# Patient Record
Sex: Female | Born: 1940 | Race: White | Hispanic: No | Marital: Married | Smoking: Never smoker
Health system: Southern US, Community
[De-identification: ages and names within clinical notes are randomized; demographics above are authoritative.]

## PROBLEM LIST (undated history)

## (undated) DIAGNOSIS — K5900 Constipation, unspecified: Secondary | ICD-10-CM

## (undated) DIAGNOSIS — I2510 Atherosclerotic heart disease of native coronary artery without angina pectoris: Secondary | ICD-10-CM

## (undated) DIAGNOSIS — R609 Edema, unspecified: Secondary | ICD-10-CM

## (undated) DIAGNOSIS — I10 Essential (primary) hypertension: Secondary | ICD-10-CM

## (undated) DIAGNOSIS — I219 Acute myocardial infarction, unspecified: Secondary | ICD-10-CM

## (undated) DIAGNOSIS — C801 Malignant (primary) neoplasm, unspecified: Secondary | ICD-10-CM

## (undated) HISTORY — PX: COLONOSCOPY: SHX174

## (undated) HISTORY — PX: BACK SURGERY: SHX140

## (undated) HISTORY — PX: CORONARY ANGIOPLASTY: SHX604

---

## 2004-07-06 ENCOUNTER — Ambulatory Visit: Payer: Self-pay | Admitting: Internal Medicine

## 2005-08-07 ENCOUNTER — Ambulatory Visit: Payer: Self-pay | Admitting: Internal Medicine

## 2006-04-12 ENCOUNTER — Ambulatory Visit: Payer: Self-pay | Admitting: Gastroenterology

## 2006-08-10 ENCOUNTER — Ambulatory Visit: Payer: Self-pay | Admitting: Internal Medicine

## 2007-08-12 ENCOUNTER — Ambulatory Visit: Payer: Self-pay | Admitting: Internal Medicine

## 2008-08-13 ENCOUNTER — Ambulatory Visit: Payer: Self-pay | Admitting: Internal Medicine

## 2009-08-17 ENCOUNTER — Ambulatory Visit: Payer: Self-pay | Admitting: Internal Medicine

## 2010-08-19 ENCOUNTER — Ambulatory Visit: Payer: Self-pay | Admitting: Internal Medicine

## 2011-09-13 ENCOUNTER — Ambulatory Visit: Payer: Self-pay | Admitting: Internal Medicine

## 2012-09-25 ENCOUNTER — Ambulatory Visit: Payer: Self-pay | Admitting: Internal Medicine

## 2013-02-24 ENCOUNTER — Ambulatory Visit: Payer: Self-pay | Admitting: Specialist

## 2013-03-05 ENCOUNTER — Encounter (HOSPITAL_COMMUNITY): Payer: Self-pay | Admitting: Pharmacy Technician

## 2013-03-05 ENCOUNTER — Other Ambulatory Visit: Payer: Self-pay | Admitting: Neurosurgery

## 2013-03-06 ENCOUNTER — Encounter (HOSPITAL_COMMUNITY): Payer: Self-pay

## 2013-03-06 ENCOUNTER — Encounter (HOSPITAL_COMMUNITY)
Admission: RE | Admit: 2013-03-06 | Discharge: 2013-03-06 | Disposition: A | Discharge status: Home / Self Care | Payer: Medicare Other | Source: Ambulatory Visit | Attending: Neurosurgery | Admitting: Neurosurgery

## 2013-03-06 HISTORY — DX: Constipation, unspecified: K59.00

## 2013-03-06 HISTORY — DX: Malignant (primary) neoplasm, unspecified: C80.1

## 2013-03-06 HISTORY — DX: Essential (primary) hypertension: I10

## 2013-03-06 LAB — CBC WITH DIFFERENTIAL/PLATELET
Basophils Absolute: 0 10*3/uL (ref 0.0–0.1)
HCT: 39.9 % (ref 36.0–46.0)
Hemoglobin: 14.2 g/dL (ref 12.0–15.0)
Lymphocytes Relative: 21 % (ref 12–46)
Lymphs Abs: 1.7 10*3/uL (ref 0.7–4.0)
Monocytes Absolute: 0.7 10*3/uL (ref 0.1–1.0)
Monocytes Relative: 9 % (ref 3–12)
Neutro Abs: 5.5 10*3/uL (ref 1.7–7.7)
Neutrophils Relative %: 68 % (ref 43–77)
WBC: 8.1 10*3/uL (ref 4.0–10.5)

## 2013-03-06 LAB — SURGICAL PCR SCREEN
MRSA, PCR: NEGATIVE
Staphylococcus aureus: NEGATIVE

## 2013-03-06 LAB — BASIC METABOLIC PANEL
BUN: 18 mg/dL (ref 6–23)
CO2: 31 mEq/L (ref 19–32)
Chloride: 97 mEq/L (ref 96–112)
Creatinine, Ser: 0.83 mg/dL (ref 0.50–1.10)
GFR calc Af Amer: 80 mL/min — ABNORMAL LOW (ref >90)
Potassium: 3.4 mEq/L — ABNORMAL LOW (ref 3.5–5.1)

## 2013-03-06 MED ORDER — DEXAMETHASONE SODIUM PHOSPHATE 10 MG/ML IJ SOLN
10.0000 mg | INTRAMUSCULAR | Status: AC
  Administered 2013-03-07: 10 mg via INTRAVENOUS
  Filled 2013-03-06: qty 1

## 2013-03-06 MED ORDER — CEFAZOLIN SODIUM-DEXTROSE 2-3 GM-% IV SOLR
2.0000 g | INTRAVENOUS | Status: AC
  Administered 2013-03-07: 2 g via INTRAVENOUS
  Filled 2013-03-06: qty 50

## 2013-03-06 NOTE — Progress Notes (Signed)
Pt does not see a cardiologist and does not remember having a  EKG, "if I did it was along time ago,"

## 2013-03-06 NOTE — Pre-Procedure Instructions (Signed)
Mandy Gilbert  03/06/2013   Your procedure is scheduled on:  Friday, October 3rd.  Report to [facility], Main Entrance Juluis Rainier "A"at 5:30 AM.  Call this number if you have problems the morning of surgery: [phone]   Remember:   Do not eat food or drink liquids after midnight.   Take these medicines the morning of surgery with A SIP OF WATER: diltiazem (DILACOR XR), propranolol (INDERAL).  Ultram if needed.     Do not wear jewelry, make-up or nail polish.  Do not wear lotions, powders, or perfumes.  Do not shave 48 hours prior to surgery.   Do not bring valuables to the hospital.  [facility] is not responsible for any belongings or valuables.               Contacts, dentures or bridgework may not be worn into surgery.  Leave suitcase in the car. After surgery it may be brought to your room.  For patients admitted to the hospital, discharge time is determined by your  treatment team.                 Special Instructions:Shower with CHG wash (Bactoshield) tonight and again in the am prior to arriving to hospital.   Please read over the following fact sheets that you were given: Pain Booklet, Coughing and Deep Breathing and Surgical Site Infection Prevention

## 2013-03-07 ENCOUNTER — Encounter (HOSPITAL_COMMUNITY): Payer: Self-pay | Admitting: Anesthesiology

## 2013-03-07 ENCOUNTER — Inpatient Hospital Stay (HOSPITAL_COMMUNITY): Payer: Medicare Other

## 2013-03-07 ENCOUNTER — Inpatient Hospital Stay (HOSPITAL_COMMUNITY)
Admission: RE | Admit: 2013-03-07 | Discharge: 2013-03-07 | DRG: 030 | Disposition: A | Discharge status: Home / Self Care | Payer: Medicare Other | Source: Ambulatory Visit | Attending: Neurosurgery | Admitting: Neurosurgery

## 2013-03-07 ENCOUNTER — Encounter (HOSPITAL_COMMUNITY)
Admission: RE | Disposition: A | Discharge status: Home / Self Care | Payer: Self-pay | Source: Ambulatory Visit | Attending: Neurosurgery

## 2013-03-07 ENCOUNTER — Encounter (HOSPITAL_COMMUNITY): Payer: Self-pay | Admitting: *Deleted

## 2013-03-07 ENCOUNTER — Inpatient Hospital Stay (HOSPITAL_COMMUNITY): Payer: Medicare Other | Admitting: Anesthesiology

## 2013-03-07 DIAGNOSIS — Z79899 Other long term (current) drug therapy: Secondary | ICD-10-CM

## 2013-03-07 DIAGNOSIS — M7138 Other bursal cyst, other site: Secondary | ICD-10-CM | POA: Diagnosis present

## 2013-03-07 DIAGNOSIS — M47817 Spondylosis without myelopathy or radiculopathy, lumbosacral region: Secondary | ICD-10-CM | POA: Diagnosis present

## 2013-03-07 DIAGNOSIS — I10 Essential (primary) hypertension: Secondary | ICD-10-CM | POA: Diagnosis present

## 2013-03-07 DIAGNOSIS — Z01812 Encounter for preprocedural laboratory examination: Secondary | ICD-10-CM

## 2013-03-07 DIAGNOSIS — Z91013 Allergy to seafood: Secondary | ICD-10-CM

## 2013-03-07 DIAGNOSIS — G96198 Other disorders of meninges, not elsewhere classified: Principal | ICD-10-CM | POA: Diagnosis present

## 2013-03-07 DIAGNOSIS — Z882 Allergy status to sulfonamides status: Secondary | ICD-10-CM

## 2013-03-07 HISTORY — PX: LUMBAR LAMINECTOMY/DECOMPRESSION MICRODISCECTOMY: SHX5026

## 2013-03-07 SURGERY — LUMBAR LAMINECTOMY/DECOMPRESSION MICRODISCECTOMY 1 LEVEL
Anesthesia: General | Site: Back | Laterality: Right | Wound class: Clean

## 2013-03-07 MED ORDER — CYCLOBENZAPRINE HCL 10 MG PO TABS: 10.0000 mg | ORAL_TABLET | Freq: Three times a day (TID) | ORAL | Status: DC | PRN

## 2013-03-07 MED ORDER — CEFAZOLIN SODIUM 1-5 GM-% IV SOLN
1.0000 g | Freq: Three times a day (TID) | INTRAVENOUS | Status: DC
  Administered 2013-03-07: 1 g via INTRAVENOUS
  Filled 2013-03-07 (×2): qty 50

## 2013-03-07 MED ORDER — 0.9 % SODIUM CHLORIDE (POUR BTL) OPTIME
TOPICAL | Status: DC | PRN
  Administered 2013-03-07: 1000 mL

## 2013-03-07 MED ORDER — LACTATED RINGERS IV SOLN
INTRAVENOUS | Status: DC | PRN
  Administered 2013-03-07 (×2): via INTRAVENOUS

## 2013-03-07 MED ORDER — ROCURONIUM BROMIDE 100 MG/10ML IV SOLN
INTRAVENOUS | Status: DC | PRN
  Administered 2013-03-07: 40 mg via INTRAVENOUS

## 2013-03-07 MED ORDER — ACETAMINOPHEN 325 MG PO TABS: 650.0000 mg | ORAL_TABLET | ORAL | Status: DC | PRN

## 2013-03-07 MED ORDER — ONDANSETRON HCL 4 MG/2ML IJ SOLN
INTRAMUSCULAR | Status: DC | PRN
  Administered 2013-03-07: 4 mg via INTRAVENOUS

## 2013-03-07 MED ORDER — ALUM & MAG HYDROXIDE-SIMETH 200-200-20 MG/5ML PO SUSP: 30.0000 mL | Freq: Four times a day (QID) | ORAL | Status: DC | PRN

## 2013-03-07 MED ORDER — LIDOCAINE HCL (CARDIAC) 20 MG/ML IV SOLN
INTRAVENOUS | Status: DC | PRN
  Administered 2013-03-07: 50 mg via INTRAVENOUS

## 2013-03-07 MED ORDER — MIDAZOLAM HCL 5 MG/5ML IJ SOLN
INTRAMUSCULAR | Status: DC | PRN
  Administered 2013-03-07: 1 mg via INTRAVENOUS

## 2013-03-07 MED ORDER — OXYCODONE-ACETAMINOPHEN 5-325 MG PO TABS
1.0000 | ORAL_TABLET | ORAL | Status: DC | PRN
  Administered 2013-03-07: 1 via ORAL
  Filled 2013-03-07: qty 1

## 2013-03-07 MED ORDER — OXYCODONE HCL 5 MG PO TABS
ORAL_TABLET | ORAL | Status: AC
  Filled 2013-03-07: qty 1

## 2013-03-07 MED ORDER — SODIUM CHLORIDE 0.9 % IJ SOLN: 3.0000 mL | Freq: Two times a day (BID) | INTRAMUSCULAR | Status: DC

## 2013-03-07 MED ORDER — GLYCOPYRROLATE 0.2 MG/ML IJ SOLN
INTRAMUSCULAR | Status: DC | PRN
  Administered 2013-03-07: .8 mg via INTRAVENOUS

## 2013-03-07 MED ORDER — PROPOFOL 10 MG/ML IV BOLUS
INTRAVENOUS | Status: DC | PRN
  Administered 2013-03-07: 100 mg via INTRAVENOUS

## 2013-03-07 MED ORDER — HEMOSTATIC AGENTS (NO CHARGE) OPTIME
TOPICAL | Status: DC | PRN
  Administered 2013-03-07: 1 via TOPICAL

## 2013-03-07 MED ORDER — THROMBIN 5000 UNITS EX SOLR
CUTANEOUS | Status: DC | PRN
  Administered 2013-03-07 (×2): 5000 [IU] via TOPICAL

## 2013-03-07 MED ORDER — DILTIAZEM HCL ER 240 MG PO CP24
240.0000 mg | ORAL_CAPSULE | Freq: Every morning | ORAL | Status: DC
  Filled 2013-03-07: qty 1

## 2013-03-07 MED ORDER — KETOROLAC TROMETHAMINE 30 MG/ML IJ SOLN
INTRAMUSCULAR | Status: DC | PRN
  Administered 2013-03-07: 30 mg via INTRAVENOUS

## 2013-03-07 MED ORDER — HYDROMORPHONE HCL PF 1 MG/ML IJ SOLN: 0.5000 mg | INTRAMUSCULAR | Status: DC | PRN

## 2013-03-07 MED ORDER — BUPIVACAINE HCL (PF) 0.25 % IJ SOLN
INTRAMUSCULAR | Status: DC | PRN
  Administered 2013-03-07: 20 mL

## 2013-03-07 MED ORDER — ONDANSETRON HCL 4 MG/2ML IJ SOLN
4.0000 mg | INTRAMUSCULAR | Status: DC | PRN
  Administered 2013-03-07: 4 mg via INTRAVENOUS
  Filled 2013-03-07: qty 2

## 2013-03-07 MED ORDER — PHENOL 1.4 % MT LIQD: 1.0000 | OROMUCOSAL | Status: DC | PRN

## 2013-03-07 MED ORDER — MENTHOL 3 MG MT LOZG: 1.0000 | LOZENGE | OROMUCOSAL | Status: DC | PRN

## 2013-03-07 MED ORDER — SODIUM CHLORIDE 0.9 % IR SOLN
Status: DC | PRN
  Administered 2013-03-07: 07:00:00

## 2013-03-07 MED ORDER — INDAPAMIDE 1.25 MG PO TABS
1.2500 mg | ORAL_TABLET | Freq: Every morning | ORAL | Status: DC
  Filled 2013-03-07: qty 1

## 2013-03-07 MED ORDER — ARTIFICIAL TEARS OP OINT
TOPICAL_OINTMENT | OPHTHALMIC | Status: DC | PRN
  Administered 2013-03-07: 1 via OPHTHALMIC

## 2013-03-07 MED ORDER — MEPERIDINE HCL 25 MG/ML IJ SOLN: 6.2500 mg | INTRAMUSCULAR | Status: DC | PRN

## 2013-03-07 MED ORDER — ATORVASTATIN CALCIUM 20 MG PO TABS
20.0000 mg | ORAL_TABLET | Freq: Every morning | ORAL | Status: DC
  Filled 2013-03-07: qty 1

## 2013-03-07 MED ORDER — MIDAZOLAM HCL 2 MG/2ML IJ SOLN: 0.5000 mg | Freq: Once | INTRAMUSCULAR | Status: DC | PRN

## 2013-03-07 MED ORDER — SODIUM CHLORIDE 0.9 % IV SOLN: 250.0000 mL | INTRAVENOUS | Status: DC

## 2013-03-07 MED ORDER — SODIUM CHLORIDE 0.9 % IJ SOLN: 3.0000 mL | INTRAMUSCULAR | Status: DC | PRN

## 2013-03-07 MED ORDER — OXYCODONE HCL 5 MG/5ML PO SOLN: 5.0000 mg | Freq: Once | ORAL | Status: AC | PRN

## 2013-03-07 MED ORDER — NEOSTIGMINE METHYLSULFATE 1 MG/ML IJ SOLN
INTRAMUSCULAR | Status: DC | PRN
  Administered 2013-03-07: 4 mg via INTRAVENOUS

## 2013-03-07 MED ORDER — PROPRANOLOL HCL 20 MG PO TABS
20.0000 mg | ORAL_TABLET | Freq: Two times a day (BID) | ORAL | Status: DC
  Filled 2013-03-07 (×2): qty 1

## 2013-03-07 MED ORDER — HYDROMORPHONE HCL PF 1 MG/ML IJ SOLN
INTRAMUSCULAR | Status: AC
  Filled 2013-03-07: qty 1

## 2013-03-07 MED ORDER — TRAMADOL HCL 50 MG PO TABS: 50.0000 mg | ORAL_TABLET | ORAL | Status: DC | PRN

## 2013-03-07 MED ORDER — PROMETHAZINE HCL 25 MG/ML IJ SOLN: 6.2500 mg | INTRAMUSCULAR | Status: DC | PRN

## 2013-03-07 MED ORDER — EPHEDRINE SULFATE 50 MG/ML IJ SOLN
INTRAMUSCULAR | Status: DC | PRN
  Administered 2013-03-07: 10 mg via INTRAVENOUS

## 2013-03-07 MED ORDER — HYDROMORPHONE HCL PF 1 MG/ML IJ SOLN
0.2500 mg | INTRAMUSCULAR | Status: DC | PRN
Start: 2013-03-07 — End: 2013-03-07
  Administered 2013-03-07 (×2): 0.5 mg via INTRAVENOUS

## 2013-03-07 MED ORDER — SENNA 8.6 MG PO TABS
1.0000 | ORAL_TABLET | Freq: Two times a day (BID) | ORAL | Status: DC
  Administered 2013-03-07: 8.6 mg via ORAL
  Filled 2013-03-07: qty 1

## 2013-03-07 MED ORDER — ACETAMINOPHEN 650 MG RE SUPP: 650.0000 mg | RECTAL | Status: DC | PRN

## 2013-03-07 MED ORDER — OXYCODONE HCL 5 MG PO TABS
5.0000 mg | ORAL_TABLET | Freq: Once | ORAL | Status: AC | PRN
  Administered 2013-03-07: 5 mg via ORAL

## 2013-03-07 MED ORDER — HYDROCODONE-ACETAMINOPHEN 5-325 MG PO TABS: 1.0000 | ORAL_TABLET | ORAL | Status: DC | PRN

## 2013-03-07 MED ORDER — FENTANYL CITRATE 0.05 MG/ML IJ SOLN
INTRAMUSCULAR | Status: DC | PRN
  Administered 2013-03-07: 250 ug via INTRAVENOUS

## 2013-03-07 SURGICAL SUPPLY — 49 items
BAG DECANTER FOR FLEXI CONT (MISCELLANEOUS) ×2 IMPLANT
BENZOIN TINCTURE PRP APPL 2/3 (GAUZE/BANDAGES/DRESSINGS) ×2 IMPLANT
BLADE SURG ROTATE 9660 (MISCELLANEOUS) IMPLANT
BRUSH SCRUB EZ PLAIN DRY (MISCELLANEOUS) ×2 IMPLANT
BUR CUTTER 7.0 ROUND (BURR) ×2 IMPLANT
CANISTER SUCTION 2500CC (MISCELLANEOUS) ×2 IMPLANT
CLOTH BEACON ORANGE TIMEOUT ST (SAFETY) ×2 IMPLANT
CONT SPEC 4OZ CLIKSEAL STRL BL (MISCELLANEOUS) ×2 IMPLANT
DECANTER SPIKE VIAL GLASS SM (MISCELLANEOUS) ×2 IMPLANT
DERMABOND ADVANCED (GAUZE/BANDAGES/DRESSINGS) ×1
DERMABOND ADVANCED .7 DNX12 (GAUZE/BANDAGES/DRESSINGS) ×1 IMPLANT
DRAPE LAPAROTOMY 100X72X124 (DRAPES) ×2 IMPLANT
DRAPE MICROSCOPE ZEISS OPMI (DRAPES) ×2 IMPLANT
DRAPE POUCH INSTRU U-SHP 10X18 (DRAPES) ×2 IMPLANT
DRAPE PROXIMA HALF (DRAPES) IMPLANT
DRAPE SURG 17X23 STRL (DRAPES) ×4 IMPLANT
DURAPREP 26ML APPLICATOR (WOUND CARE) ×2 IMPLANT
ELECT REM PT RETURN 9FT ADLT (ELECTROSURGICAL) ×2
ELECTRODE REM PT RTRN 9FT ADLT (ELECTROSURGICAL) ×1 IMPLANT
GAUZE SPONGE 4X4 16PLY XRAY LF (GAUZE/BANDAGES/DRESSINGS) IMPLANT
GLOVE ECLIPSE 7.5 STRL STRAW (GLOVE) ×2 IMPLANT
GLOVE ECLIPSE 8.5 STRL (GLOVE) ×2 IMPLANT
GLOVE EXAM NITRILE LRG STRL (GLOVE) IMPLANT
GLOVE EXAM NITRILE MD LF STRL (GLOVE) IMPLANT
GLOVE EXAM NITRILE XL STR (GLOVE) IMPLANT
GLOVE EXAM NITRILE XS STR PU (GLOVE) IMPLANT
GLOVE INDICATOR 7.0 STRL GRN (GLOVE) ×2 IMPLANT
GLOVE INDICATOR 8.0 STRL GRN (GLOVE) ×2 IMPLANT
GLOVE OPTIFIT SS 6.5 STRL BRWN (GLOVE) ×4 IMPLANT
GOWN BRE IMP SLV AUR LG STRL (GOWN DISPOSABLE) IMPLANT
GOWN BRE IMP SLV AUR XL STRL (GOWN DISPOSABLE) ×2 IMPLANT
GOWN STRL REIN 2XL LVL4 (GOWN DISPOSABLE) IMPLANT
KIT BASIN OR (CUSTOM PROCEDURE TRAY) ×2 IMPLANT
KIT ROOM TURNOVER OR (KITS) ×2 IMPLANT
NEEDLE HYPO 22GX1.5 SAFETY (NEEDLE) ×2 IMPLANT
NEEDLE SPNL 22GX3.5 QUINCKE BK (NEEDLE) ×2 IMPLANT
NS IRRIG 1000ML POUR BTL (IV SOLUTION) ×2 IMPLANT
PACK LAMINECTOMY NEURO (CUSTOM PROCEDURE TRAY) ×2 IMPLANT
PAD ARMBOARD 7.5X6 YLW CONV (MISCELLANEOUS) ×6 IMPLANT
RUBBERBAND STERILE (MISCELLANEOUS) ×4 IMPLANT
SPONGE GAUZE 4X4 12PLY (GAUZE/BANDAGES/DRESSINGS) ×2 IMPLANT
SPONGE SURGIFOAM ABS GEL SZ50 (HEMOSTASIS) ×2 IMPLANT
STRIP CLOSURE SKIN 1/2X4 (GAUZE/BANDAGES/DRESSINGS) ×2 IMPLANT
SUT VIC AB 2-0 CT1 18 (SUTURE) ×2 IMPLANT
SUT VIC AB 3-0 SH 8-18 (SUTURE) ×2 IMPLANT
SYR 20ML ECCENTRIC (SYRINGE) ×2 IMPLANT
TOWEL OR 17X24 6PK STRL BLUE (TOWEL DISPOSABLE) ×2 IMPLANT
TOWEL OR 17X26 10 PK STRL BLUE (TOWEL DISPOSABLE) ×2 IMPLANT
WATER STERILE IRR 1000ML POUR (IV SOLUTION) ×2 IMPLANT

## 2013-03-07 NOTE — Preoperative (Signed)
Beta Blockers   Reason not to administer Beta Blockers:Not Applicable 

## 2013-03-07 NOTE — H&P (Signed)
Mandy Gilbert is an 72 y.o. female.   Chief Complaint: Right leg pain HPI: 72 year old female presents with right lower chamois pain paresthesias and weakness. Workup demonstrates evidence of a large right-sided L4-5 synovial cyst with marked compression of the right L5 nerve root. Patient presents now for laminotomy and resection of the synovial cyst in hopes of improving her symptoms.  Past Medical History  Diagnosis Date  . Hypertension   . Constipation   . Cancer     Skin cancer legs, basal cell.    Past Surgical History  Procedure Laterality Date  . Colonoscopy      History reviewed. No pertinent family history. Social History:  reports that she has never smoked. She does not have any smokeless tobacco history on file. She reports that she does not drink alcohol or use illicit drugs.  Allergies:  Allergies  Allergen Reactions  . Adhesive [Tape] Itching and Other (See Comments)    Turns red.  Can use paper tape  . Shrimp [Shellfish Allergy] Nausea And Vomiting  . Sulfa Antibiotics Rash    Medications Prior to Admission  Medication Sig Dispense Refill  . atorvastatin (LIPITOR) 20 MG tablet Take 20 mg by mouth every morning.      . Cholecalciferol (VITAMIN D PO) Take 1 tablet by mouth every morning.      . diltiazem (DILACOR XR) 240 MG 24 hr capsule Take 240 mg by mouth every morning.      . indapamide (LOZOL) 1.25 MG tablet Take 1.25 mg by mouth every morning.      Marland Kitchen OVER THE COUNTER MEDICATION Take 2 capsules by mouth at bedtime. Stool Softner      . propranolol (INDERAL) 20 MG tablet Take 20 mg by mouth 2 (two) times daily.      . Psyllium (METAMUCIL PO) Take 3 capsules by mouth every morning.      . traMADol (ULTRAM) 50 MG tablet Take 50 mg by mouth every 4 (four) hours as needed for pain.        Results for orders placed during the hospital encounter of 03/06/13 (from the past 48 hour(s))  SURGICAL PCR SCREEN     Status: None   Collection Time    03/06/13   1:25 PM      Result Value Range   MRSA, PCR NEGATIVE  NEGATIVE   Staphylococcus aureus NEGATIVE  NEGATIVE   Comment:            The Xpert SA Assay (FDA     approved for NASAL specimens     in patients over 68 years of age),     is one component of     a comprehensive surveillance     program.  Test performance has     been validated by [employer] for patients greater     than or equal to 86 year old.     It is not intended     to diagnose infection nor to     guide or monitor treatment.  CBC WITH DIFFERENTIAL     Status: None   Collection Time    03/06/13  1:26 PM      Result Value Range   WBC 8.1  4.0 - 10.5 K/uL   RBC 4.44  3.87 - 5.11 MIL/uL   Hemoglobin 14.2  12.0 - 15.0 g/dL   HCT 78.4  69.6 - 29.5 %   MCV 89.9  78.0 - 100.0 fL  MCH 32.0  26.0 - 34.0 pg   MCHC 35.6  30.0 - 36.0 g/dL   RDW 16.1  09.6 - 04.5 %   Platelets 192  150 - 400 K/uL   Neutrophils Relative % 68  43 - 77 %   Neutro Abs 5.5  1.7 - 7.7 K/uL   Lymphocytes Relative 21  12 - 46 %   Lymphs Abs 1.7  0.7 - 4.0 K/uL   Monocytes Relative 9  3 - 12 %   Monocytes Absolute 0.7  0.1 - 1.0 K/uL   Eosinophils Relative 2  0 - 5 %   Eosinophils Absolute 0.1  0.0 - 0.7 K/uL   Basophils Relative 1  0 - 1 %   Basophils Absolute 0.0  0.0 - 0.1 K/uL  BASIC METABOLIC PANEL     Status: Abnormal   Collection Time    03/06/13  1:26 PM      Result Value Range   Sodium 137  135 - 145 mEq/L   Potassium 3.4 (*) 3.5 - 5.1 mEq/L   Chloride 97  96 - 112 mEq/L   CO2 31  19 - 32 mEq/L   Glucose, Bld 101 (*) 70 - 99 mg/dL   BUN 18  6 - 23 mg/dL   Creatinine, Ser 4.09  0.50 - 1.10 mg/dL   Calcium 81.1  8.4 - 91.4 mg/dL   GFR calc non Af Amer 69 (*) >90 mL/min   GFR calc Af Amer 80 (*) >90 mL/min   Comment: (NOTE)     The eGFR has been calculated using the CKD EPI equation.     This calculation has not been validated in all clinical situations.     eGFR's persistently <90 mL/min signify possible Chronic Kidney      Disease.   Dg Chest 2 View  03/06/2013   CLINICAL DATA:  Preop for lumbar laminectomy. Hypertension.  EXAM: CHEST  2 VIEW  COMPARISON:  None.  FINDINGS: The heart size and mediastinal contours are within normal limits. The lungs are hyperexpanded but clear. No pleural effusion or pneumothorax.  The bony thorax is demineralized but intact.  IMPRESSION: No active cardiopulmonary disease.   Electronically Signed   By: Amie Portland   On: 03/06/2013 14:55    Review of Systems  Constitutional: Negative.   HENT: Negative.   Eyes: Negative.   Respiratory: Negative.   Cardiovascular: Negative.   Gastrointestinal: Negative.   Genitourinary: Negative.   Musculoskeletal: Negative.   Skin: Negative.   Neurological: Negative.   Endo/Heme/Allergies: Negative.   Psychiatric/Behavioral: Negative.     Blood pressure 164/78, pulse 69, temperature 97 F (36.1 C), temperature source Oral, resp. rate 18, SpO2 98.00%. Physical Exam  Constitutional: She is oriented to person, place, and time. She appears well-developed and well-nourished. No distress.  HENT:  Head: Normocephalic and atraumatic.  Right Ear: External ear normal.  Left Ear: External ear normal.  Nose: Nose normal.  Mouth/Throat: Oropharynx is clear and moist. No oropharyngeal exudate.  Eyes: Conjunctivae and EOM are normal. Pupils are equal, round, and reactive to light. Right eye exhibits no discharge. Left eye exhibits no discharge. No scleral icterus.  Neck: Normal range of motion. Neck supple. No tracheal deviation present. No thyromegaly present.  Respiratory: Effort normal and breath sounds normal. No respiratory distress. She has no wheezes.  GI: Soft. Bowel sounds are normal. She exhibits no distension. There is no tenderness.  Musculoskeletal: Normal range of motion. She exhibits no edema and  no tenderness.  Neurological: She is alert and oriented to person, place, and time. She has normal reflexes. She displays normal reflexes.  No cranial nerve deficit. She exhibits normal muscle tone. Coordination normal.  Skin: Skin is warm and dry. No rash noted. She is not diaphoretic. No erythema. No pallor.  Psychiatric: She has a normal mood and affect. Her behavior is normal. Judgment and thought content normal.     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 Plan right L4-5 laminotomy and resection of synovial cyst. Risks and benefits have been explained. Patient patient is aware and wishes to proceed. Janei Scheff A 03/07/2013, 7:38 AM

## 2013-03-07 NOTE — Progress Notes (Signed)
UR COMPLETED  

## 2013-03-07 NOTE — Anesthesia Postprocedure Evaluation (Signed)
  Anesthesia Post-op Note  Patient: Mandy Gilbert  Procedure(s) Performed: Procedure(s): LUMBAR LAMINECTOMY/DECOMPRESSION MICRODISCECTOMY LUMBAR FOUR-FIVE (Right)  Patient Location: PACU  Anesthesia Type:General  Level of Consciousness: awake, alert , oriented and patient cooperative  Airway and Oxygen Therapy: Patient Spontanous Breathing and Patient connected to nasal cannula oxygen  Post-op Pain: none  Post-op Assessment: Post-op Vital signs reviewed, Patient's Cardiovascular Status Stable, Respiratory Function Stable, Patent Airway, No signs of Nausea or vomiting and Pain level controlled  Post-op Vital Signs: Reviewed and stable  Complications: No apparent anesthesia complications

## 2013-03-07 NOTE — Anesthesia Preprocedure Evaluation (Addendum)
Anesthesia Evaluation  Patient identified by MRN, date of birth, ID band Patient awake    Reviewed: Allergy & Precautions, H&P , NPO status , Patient's Chart, lab work & pertinent test results  History of Anesthesia Complications Negative for: history of anesthetic complications  Airway Mallampati: I TM Distance: >3 FB Neck ROM: full    Dental  (+) Teeth Intact and Dental Advidsory Given   Pulmonary neg pulmonary ROS,  breath sounds clear to auscultation  Pulmonary exam normal       Cardiovascular hypertension, On Home Beta Blockers and Pt. on medications Rhythm:Regular Rate:Normal     Neuro/Psych Back pain: tramadol    GI/Hepatic negative GI ROS, Neg liver ROS,   Endo/Other  negative endocrine ROS  Renal/GU negative Renal ROS     Musculoskeletal   Abdominal   Peds  Hematology   Anesthesia Other Findings   Reproductive/Obstetrics                          Anesthesia Physical Anesthesia Plan  ASA: II  Anesthesia Plan: General   Post-op Pain Management:    Induction: Intravenous  Airway Management Planned: Oral ETT  Additional Equipment:   Intra-op Plan:   Post-operative Plan: Extubation in OR  Informed Consent: I have reviewed the patients History and Physical, chart, labs and discussed the procedure including the risks, benefits and alternatives for the proposed anesthesia with the patient or authorized representative who has indicated his/her understanding and acceptance.   Dental Advisory Given and Dental advisory given  Plan Discussed with: Anesthesiologist, CRNA and Surgeon  Anesthesia Plan Comments: (Plan routine monitors, GETA)       Anesthesia Quick Evaluation

## 2013-03-07 NOTE — Transfer of Care (Signed)
Immediate Anesthesia Transfer of Care Note  Patient: Mandy Gilbert  Procedure(s) Performed: Procedure(s): LUMBAR LAMINECTOMY/DECOMPRESSION MICRODISCECTOMY LUMBAR FOUR-FIVE (Right)  Patient Location: PACU  Anesthesia Type:General  Level of Consciousness: awake, alert  and oriented  Airway & Oxygen Therapy: Patient Spontanous Breathing and Patient connected to nasal cannula oxygen  Post-op Assessment: Report given to PACU RN, Post -op Vital signs reviewed and stable and Patient moving all extremities X 4  Post vital signs: Reviewed and stable  Complications: No apparent anesthesia complications

## 2013-03-07 NOTE — Discharge Summary (Signed)
Physician Discharge Summary  Patient ID: Mandy Gilbert MRN: [id number] DOB/AGE: 09/10/1940 72 y.o.  Admit date: 03/07/2013 Discharge date: 03/07/2013  Admission Diagnoses:  Discharge Diagnoses:  Principal Problem:   Synovial cyst of lumbar spine   Discharged Condition: good  Hospital Course: Patient admitted to the hospital where she underwent uncomplicated laminotomy and resection of synovial cyst. Postoperatively she is doing very well. She's up and ambulatory and ready for discharge home.  Consults:   Significant Diagnostic Studies:   Treatments:   Discharge Exam: Blood pressure 128/63, pulse 68, temperature 97.4 F (36.3 C), temperature source Oral, resp. rate 18, SpO2 96.00%. Awake and alert. Oriented and appropriate. Cranial nerve function intact. Motor and sensory function of the extremities normal. Wound clean and dry.  Disposition: Final discharge disposition not confirmed     Medication List         atorvastatin 20 MG tablet  Commonly known as:  LIPITOR  Take 20 mg by mouth every morning.     diltiazem 240 MG 24 hr capsule  Commonly known as:  DILACOR XR  Take 240 mg by mouth every morning.     indapamide 1.25 MG tablet  Commonly known as:  LOZOL  Take 1.25 mg by mouth every morning.     METAMUCIL PO  Take 3 capsules by mouth every morning.     OVER THE COUNTER MEDICATION  Take 2 capsules by mouth at bedtime. Stool Softner     propranolol 20 MG tablet  Commonly known as:  INDERAL  Take 20 mg by mouth 2 (two) times daily.     traMADol 50 MG tablet  Commonly known as:  ULTRAM  Take 50 mg by mouth every 4 (four) hours as needed for pain.     VITAMIN D PO  Take 1 tablet by mouth every morning.         Signed: Dirck Butch A 03/07/2013, 5:58 PM

## 2013-03-07 NOTE — Progress Notes (Signed)
Pt doing well. Pt and husband given D/C instructions with Rx's, verbal understanding given. Pt D/C'd home via wheelchair @ 1855 per MD order. Rema Fendt, RN

## 2013-03-07 NOTE — Anesthesia Procedure Notes (Signed)
Procedure Name: Intubation Date/Time: 03/07/2013 7:52 AM Performed by: Carmela Rima Pre-anesthesia Checklist: Patient identified, Timeout performed, Emergency Drugs available, Suction available and Patient being monitored Patient Re-evaluated:Patient Re-evaluated prior to inductionOxygen Delivery Method: Circle system utilized Preoxygenation: Pre-oxygenation with 100% oxygen Intubation Type: IV induction Ventilation: Mask ventilation without difficulty Laryngoscope Size: Mac and 3 Grade View: Grade I Tube type: Oral Tube size: 7.5 mm Number of attempts: 1 Placement Confirmation: ETT inserted through vocal cords under direct vision,  positive ETCO2 and breath sounds checked- equal and bilateral Secured at: 21 cm Tube secured with: Tape Dental Injury: Teeth and Oropharynx as per pre-operative assessment

## 2013-03-07 NOTE — Op Note (Signed)
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  Resection of epidural nonneoplastic mass (adherent synovial cyst)  Microdissection  Surgeon:Savion Washam A.Soliyana Mcchristian, M.D.  Asst. Surgeon: Newell Coral   Anesthesia: General  Indication: 72 year old female with right lower trimming a pain paresthesias and weakness consistent with predominantly her right L5 radicular pattern but with elements of a right L4 radiculopathy as well. Workup demonstrates evidence of a large right-sided L4-5 synovial cyst with marked compression of the thecal sac and right L4 and L5 nerve roots during patient presents now for decompression and resection of the synovial cyst.  Operative note: After induction of anesthesia, patient positioned prone onto Wilson frame and appropriate padded. Lumbar region prepped and draped. Incision made overlying L4-5. Dissection performed on the right side. Retractor placed. X-ray taken. Level confirmed. Laminotomy performed using high-speed drill and Kerrison rongeurs to remove the inferior aspect of lamina of L4 medial aspect the L4-5 facet joint and the superior rim of the L5 lamina. Ligament flavum was elevated and resected in piecemeal fashion using Kerrison rongeurs. Underlying thecal sac was identified. Synovial cyst was found to be densely carried to the thecal sac and right L5 nerve root. Microscope was brought into the field these were microdissection of the spinal canal. Epidural venous plexus was quite limited and cut. The synovial cyst was dissected free from the axilla of the right L4 nerve root and the cyst was resected as it coursed down along the lateral dura and L5 nerve root on the right side. Gross total  resection of the synovial cyst was achieved. There is no evidence of injury to thecal sac or nerve roots. Foraminotomies were completed along the course the exiting L4 and L5 nerve roots. At this point a very thorough decompression achieved. Wound is then irrigated with and bike solution. Gelfoam was placed topically for hemostasis. Microscope and retractor system were removed. Hemostasis of the muscle was achieved with electrocautery. Wounds and close in layers with Vicryl sutures. Steri-Strips and sterile dressing were applied. There were no apparent outpatient. Patient tolerated the procedure well and she returns to the recovery room postop.

## 2013-03-07 NOTE — Brief Op Note (Signed)
03/07/2013  9:04 AM  PATIENT:  Mandy Gilbert  72 y.o. female  PRE-OPERATIVE DIAGNOSIS:  synovial cyst  POST-OPERATIVE DIAGNOSIS:  synovial cyst  PROCEDURE:  Procedure(s): LUMBAR LAMINECTOMY/DECOMPRESSION MICRODISCECTOMY LUMBAR FOUR-FIVE (Right)  SURGEON:  Surgeon(s) and Role:    * Temple Pacini, MD - Primary    * Hewitt Shorts, MD - Assisting  PHYSICIAN ASSISTANT:   ASSISTANTS:    ANESTHESIA:   general  EBL:  Total I/O In: 1600 [I.V.:1600] Out: 100 [Blood:100]  BLOOD ADMINISTERED:none  DRAINS: none   LOCAL MEDICATIONS USED:  MARCAINE     SPECIMEN:  No Specimen  DISPOSITION OF SPECIMEN:  N/A  COUNTS:  YES  TOURNIQUET:  * No tourniquets in log *  DICTATION: .Dragon Dictation  PLAN OF CARE: Admit for overnight observation  PATIENT DISPOSITION:  PACU - hemodynamically stable.   Delay start of Pharmacological VTE agent (>24hrs) due to surgical blood loss or risk of bleeding: yes

## 2013-03-11 ENCOUNTER — Encounter (HOSPITAL_COMMUNITY): Payer: Self-pay | Admitting: Neurosurgery

## 2013-09-26 ENCOUNTER — Ambulatory Visit: Payer: Self-pay | Admitting: Internal Medicine

## 2014-01-31 DIAGNOSIS — G43909 Migraine, unspecified, not intractable, without status migrainosus: Secondary | ICD-10-CM | POA: Insufficient documentation

## 2014-01-31 DIAGNOSIS — M1990 Unspecified osteoarthritis, unspecified site: Secondary | ICD-10-CM | POA: Insufficient documentation

## 2014-01-31 DIAGNOSIS — I739 Peripheral vascular disease, unspecified: Secondary | ICD-10-CM

## 2014-01-31 DIAGNOSIS — E785 Hyperlipidemia, unspecified: Secondary | ICD-10-CM | POA: Insufficient documentation

## 2014-01-31 DIAGNOSIS — M810 Age-related osteoporosis without current pathological fracture: Secondary | ICD-10-CM | POA: Insufficient documentation

## 2014-01-31 DIAGNOSIS — I10 Essential (primary) hypertension: Secondary | ICD-10-CM | POA: Insufficient documentation

## 2014-01-31 DIAGNOSIS — I779 Disorder of arteries and arterioles, unspecified: Secondary | ICD-10-CM | POA: Insufficient documentation

## 2014-09-28 ENCOUNTER — Ambulatory Visit
Admit: 2014-09-28 | Disposition: A | Discharge status: Home / Self Care | Payer: Self-pay | Attending: Internal Medicine | Admitting: Internal Medicine

## 2014-10-27 IMAGING — CR DG LUMBAR SPINE 1V
1 series · 1 of 1 positions shown · non-contrast
Comparison: None.

CLINICAL DATA: Lumbar laminectomy

EXAM:
LUMBAR SPINE - 1 VIEW

[lateral]
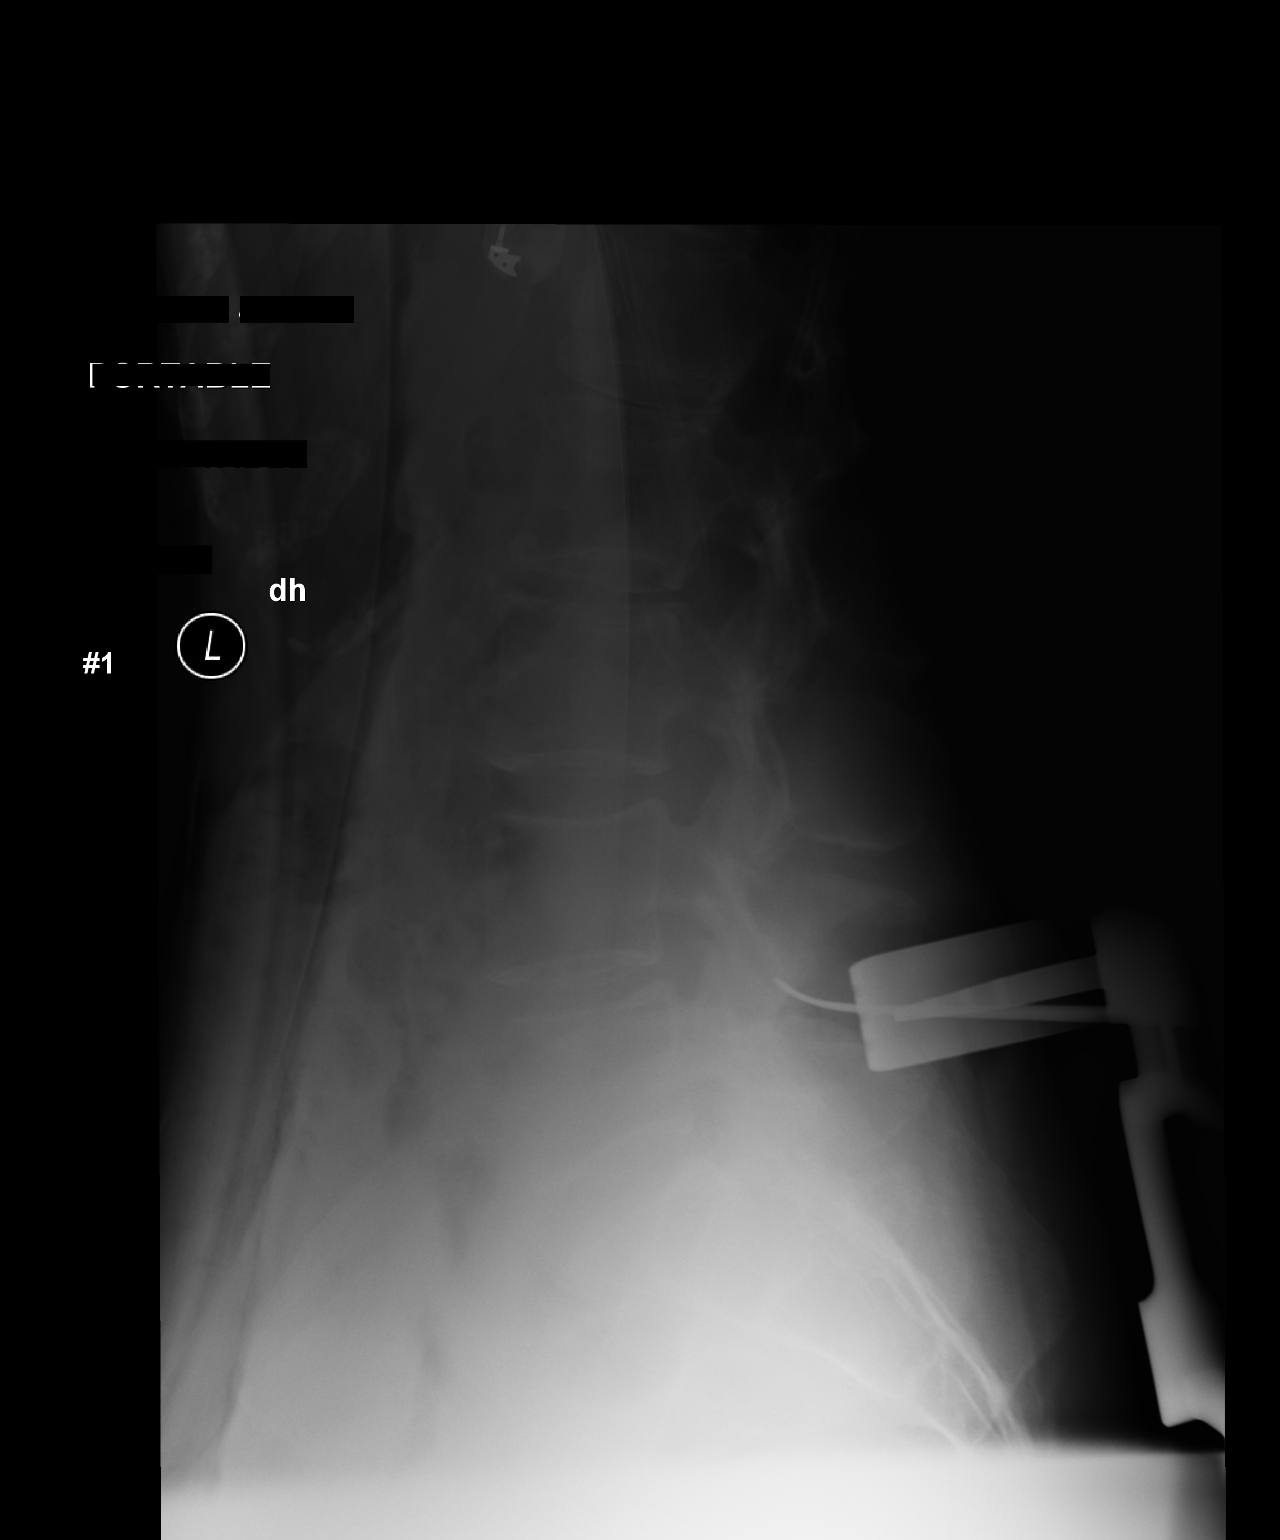

[1 of 1 positions shown; findings below may reference images not displayed]

FINDINGS: The lowest complete disk is presumed as the L5-S1 disk. A surgical
instrument projects over the L4 inferior articular facet. Anatomic
alignment.
IMPRESSION: Intraoperative marking at L4-5.

## 2015-06-11 ENCOUNTER — Encounter (HOSPITAL_COMMUNITY)
Admission: RE | Disposition: A | Discharge status: Home / Self Care | Payer: Self-pay | Source: Other Acute Inpatient Hospital | Attending: Cardiology

## 2015-06-11 ENCOUNTER — Other Ambulatory Visit: Payer: Self-pay | Admitting: Internal Medicine

## 2015-06-11 ENCOUNTER — Encounter (HOSPITAL_COMMUNITY): Payer: Self-pay | Admitting: *Deleted

## 2015-06-11 ENCOUNTER — Other Ambulatory Visit: Payer: Self-pay

## 2015-06-11 ENCOUNTER — Other Ambulatory Visit
Admission: RE | Admit: 2015-06-11 | Discharge: 2015-06-11 | Disposition: A | Discharge status: Home / Self Care | Payer: Medicare Other | Source: Ambulatory Visit | Attending: Internal Medicine | Admitting: Internal Medicine

## 2015-06-11 ENCOUNTER — Ambulatory Visit
Admission: RE | Admit: 2015-06-11 | Discharge: 2015-06-11 | Disposition: A | Discharge status: Home / Self Care | Payer: Medicare Other | Source: Ambulatory Visit | Attending: Internal Medicine | Admitting: Internal Medicine

## 2015-06-11 ENCOUNTER — Inpatient Hospital Stay (HOSPITAL_COMMUNITY)
Admission: RE | Admit: 2015-06-11 | Discharge: 2015-06-13 | DRG: 247 | Disposition: A | Discharge status: Home / Self Care | Payer: Medicare Other | Source: Other Acute Inpatient Hospital | Attending: Cardiology | Admitting: Cardiology

## 2015-06-11 DIAGNOSIS — Z91048 Other nonmedicinal substance allergy status: Secondary | ICD-10-CM | POA: Diagnosis not present

## 2015-06-11 DIAGNOSIS — R7303 Prediabetes: Secondary | ICD-10-CM | POA: Diagnosis present

## 2015-06-11 DIAGNOSIS — Z85828 Personal history of other malignant neoplasm of skin: Secondary | ICD-10-CM | POA: Diagnosis not present

## 2015-06-11 DIAGNOSIS — Z79899 Other long term (current) drug therapy: Secondary | ICD-10-CM

## 2015-06-11 DIAGNOSIS — I2119 ST elevation (STEMI) myocardial infarction involving other coronary artery of inferior wall: Principal | ICD-10-CM | POA: Diagnosis present

## 2015-06-11 DIAGNOSIS — I10 Essential (primary) hypertension: Secondary | ICD-10-CM | POA: Diagnosis present

## 2015-06-11 DIAGNOSIS — Z881 Allergy status to other antibiotic agents status: Secondary | ICD-10-CM

## 2015-06-11 DIAGNOSIS — R0602 Shortness of breath: Secondary | ICD-10-CM

## 2015-06-11 DIAGNOSIS — R0789 Other chest pain: Secondary | ICD-10-CM

## 2015-06-11 DIAGNOSIS — I2510 Atherosclerotic heart disease of native coronary artery without angina pectoris: Secondary | ICD-10-CM | POA: Diagnosis present

## 2015-06-11 DIAGNOSIS — Z91013 Allergy to seafood: Secondary | ICD-10-CM

## 2015-06-11 DIAGNOSIS — E785 Hyperlipidemia, unspecified: Secondary | ICD-10-CM | POA: Diagnosis present

## 2015-06-11 HISTORY — PX: CARDIAC CATHETERIZATION: SHX172

## 2015-06-11 LAB — CK TOTAL AND CKMB (NOT AT ARMC)
CK TOTAL: 73 U/L (ref 38–234)
CK, MB: 2.4 ng/mL (ref 0.5–5.0)
Relative Index: INVALID (ref 0.0–2.5)

## 2015-06-11 LAB — CBC WITH DIFFERENTIAL/PLATELET
BASOS ABS: 0 10*3/uL (ref 0.0–0.1)
BASOS PCT: 0 %
Eosinophils Absolute: 0 10*3/uL (ref 0.0–0.7)
Eosinophils Relative: 0 %
HEMATOCRIT: 38 % (ref 36.0–46.0)
HEMOGLOBIN: 12.9 g/dL (ref 12.0–15.0)
LYMPHS PCT: 9 %
Lymphs Abs: 0.6 10*3/uL — ABNORMAL LOW (ref 0.7–4.0)
MCH: 30.3 pg (ref 26.0–34.0)
MCHC: 33.9 g/dL (ref 30.0–36.0)
MCV: 89.2 fL (ref 78.0–100.0)
Monocytes Absolute: 0.1 10*3/uL (ref 0.1–1.0)
Monocytes Relative: 1 %
NEUTROS ABS: 6.4 10*3/uL (ref 1.7–7.7)
NEUTROS PCT: 90 %
Platelets: 167 10*3/uL (ref 150–400)
RBC: 4.26 MIL/uL (ref 3.87–5.11)
RDW: 13.7 % (ref 11.5–15.5)
WBC: 7.1 10*3/uL (ref 4.0–10.5)

## 2015-06-11 LAB — COMPREHENSIVE METABOLIC PANEL
ALBUMIN: 3.1 g/dL — AB (ref 3.5–5.0)
ALT: 15 U/L (ref 14–54)
ALT: 17 U/L (ref 14–54)
ANION GAP: 10 (ref 5–15)
ANION GAP: 13 (ref 5–15)
AST: 24 U/L (ref 15–41)
AST: 23 U/L (ref 15–41)
Albumin: 3.4 g/dL — ABNORMAL LOW (ref 3.5–5.0)
Alkaline Phosphatase: 63 U/L (ref 38–126)
Alkaline Phosphatase: 68 U/L (ref 38–126)
BILIRUBIN TOTAL: 0.6 mg/dL (ref 0.3–1.2)
BUN: 16 mg/dL (ref 6–20)
BUN: 13 mg/dL (ref 6–20)
CHLORIDE: 102 mmol/L (ref 101–111)
CO2: 27 mmol/L (ref 22–32)
CO2: 27 mmol/L (ref 22–32)
Calcium: 9.3 mg/dL (ref 8.9–10.3)
Calcium: 9.7 mg/dL (ref 8.9–10.3)
Chloride: 104 mmol/L (ref 101–111)
Creatinine, Ser: 0.84 mg/dL (ref 0.44–1.00)
Creatinine, Ser: 0.69 mg/dL (ref 0.44–1.00)
GFR calc Af Amer: >60 mL/min (ref >60)
GFR calc Af Amer: >60 mL/min (ref >60)
GFR calc non Af Amer: >60 mL/min (ref >60)
GFR calc non Af Amer: >60 mL/min (ref >60)
GLUCOSE: 134 mg/dL — AB (ref 65–99)
Glucose, Bld: 177 mg/dL — ABNORMAL HIGH (ref 65–99)
POTASSIUM: 2.8 mmol/L — AB (ref 3.5–5.1)
POTASSIUM: 2.8 mmol/L — AB (ref 3.5–5.1)
SODIUM: 141 mmol/L (ref 135–145)
Sodium: 142 mmol/L (ref 135–145)
TOTAL PROTEIN: 5.5 g/dL — AB (ref 6.5–8.1)
TOTAL PROTEIN: 6.3 g/dL — AB (ref 6.5–8.1)
Total Bilirubin: 0.5 mg/dL (ref 0.3–1.2)

## 2015-06-11 LAB — TROPONIN I
TROPONIN I: 1.28 ng/mL — AB (ref <0.031)
TROPONIN I: 1.08 ng/mL — AB (ref <0.031)
TROPONIN I: 1.5 ng/mL — AB (ref <0.031)

## 2015-06-11 LAB — MRSA PCR SCREENING: MRSA by PCR: NEGATIVE

## 2015-06-11 LAB — CBC
HCT: 36.7 % (ref 36.0–46.0)
HEMOGLOBIN: 12.5 g/dL (ref 12.0–15.0)
MCH: 30.5 pg (ref 26.0–34.0)
MCHC: 34.1 g/dL (ref 30.0–36.0)
MCV: 89.5 fL (ref 78.0–100.0)
Platelets: 162 10*3/uL (ref 150–400)
RBC: 4.1 MIL/uL (ref 3.87–5.11)
RDW: 13.4 % (ref 11.5–15.5)
WBC: 7.8 10*3/uL (ref 4.0–10.5)

## 2015-06-11 LAB — LIPID PANEL
CHOL/HDL RATIO: 2.5 ratio
Cholesterol: 156 mg/dL (ref 0–200)
HDL: 63 mg/dL (ref >40)
LDL Cholesterol: 82 mg/dL (ref 0–99)
Triglycerides: 56 mg/dL (ref <150)
VLDL: 11 mg/dL (ref 0–40)

## 2015-06-11 LAB — PROTIME-INR
INR: 2 — AB (ref 0.00–1.49)
Prothrombin Time: 22.6 seconds — ABNORMAL HIGH (ref 11.6–15.2)

## 2015-06-11 LAB — APTT

## 2015-06-11 LAB — POCT I-STAT CREATININE: Creatinine, Ser: 0.9 mg/dL (ref 0.44–1.00)

## 2015-06-11 LAB — POCT ACTIVATED CLOTTING TIME: Activated Clotting Time: 549 seconds

## 2015-06-11 SURGERY — LEFT HEART CATH AND CORONARY ANGIOGRAPHY
Anesthesia: LOCAL

## 2015-06-11 MED ORDER — HEPARIN SODIUM (PORCINE) 1000 UNIT/ML IJ SOLN
INTRAMUSCULAR | Status: DC | PRN
  Administered 2015-06-11: 4000 [IU] via INTRAVENOUS

## 2015-06-11 MED ORDER — POTASSIUM CHLORIDE CRYS ER 20 MEQ PO TBCR
40.0000 meq | EXTENDED_RELEASE_TABLET | Freq: Once | ORAL | Status: AC
  Administered 2015-06-11: 40 meq via ORAL
  Filled 2015-06-11: qty 2

## 2015-06-11 MED ORDER — FAMOTIDINE IN NACL 20-0.9 MG/50ML-% IV SOLN
INTRAVENOUS | Status: AC
  Filled 2015-06-11: qty 50

## 2015-06-11 MED ORDER — MIDAZOLAM HCL 2 MG/2ML IJ SOLN
INTRAMUSCULAR | Status: AC
  Filled 2015-06-11: qty 2

## 2015-06-11 MED ORDER — IOHEXOL 350 MG/ML SOLN
75.0000 mL | Freq: Once | INTRAVENOUS | Status: AC | PRN
  Administered 2015-06-11: 75 mL via INTRAVENOUS

## 2015-06-11 MED ORDER — SODIUM CHLORIDE 0.9 % IV SOLN
250.0000 mg | INTRAVENOUS | Status: DC | PRN
  Administered 2015-06-11: 1.75 mg/kg/h via INTRAVENOUS

## 2015-06-11 MED ORDER — SODIUM CHLORIDE 0.9 % IV SOLN
INTRAVENOUS | Status: DC | PRN
  Administered 2015-06-11: 68 mL/h via INTRAVENOUS

## 2015-06-11 MED ORDER — NITROGLYCERIN IN D5W 200-5 MCG/ML-% IV SOLN
5.0000 ug/min | INTRAVENOUS | Status: DC
  Administered 2015-06-11: 5 ug/min via INTRAVENOUS

## 2015-06-11 MED ORDER — POTASSIUM CHLORIDE 10 MEQ/100ML IV SOLN
10.0000 meq | INTRAVENOUS | Status: DC
  Filled 2015-06-11 (×2): qty 100

## 2015-06-11 MED ORDER — MIDAZOLAM HCL 2 MG/2ML IJ SOLN
INTRAMUSCULAR | Status: DC | PRN
  Administered 2015-06-11: 1 mg via INTRAVENOUS

## 2015-06-11 MED ORDER — SODIUM CHLORIDE 0.9 % IV SOLN
INTRAVENOUS | Status: AC
  Administered 2015-06-11 – 2015-06-12 (×2): via INTRAVENOUS

## 2015-06-11 MED ORDER — TICAGRELOR 90 MG PO TABS: 90.0000 mg | ORAL_TABLET | Freq: Two times a day (BID) | ORAL | Status: DC

## 2015-06-11 MED ORDER — IOHEXOL 350 MG/ML SOLN
INTRAVENOUS | Status: DC | PRN
  Administered 2015-06-11: 125 mL via INTRA_ARTERIAL

## 2015-06-11 MED ORDER — ATROPINE SULFATE 0.1 MG/ML IJ SOLN
INTRAMUSCULAR | Status: AC
  Filled 2015-06-11: qty 10

## 2015-06-11 MED ORDER — TICAGRELOR 90 MG PO TABS
ORAL_TABLET | ORAL | Status: DC | PRN
  Administered 2015-06-11: 180 mg via ORAL

## 2015-06-11 MED ORDER — DIPHENHYDRAMINE HCL 50 MG/ML IJ SOLN
INTRAMUSCULAR | Status: DC | PRN
  Administered 2015-06-11: 25 mg via INTRAVENOUS

## 2015-06-11 MED ORDER — POTASSIUM CHLORIDE CRYS ER 20 MEQ PO TBCR
40.0000 meq | EXTENDED_RELEASE_TABLET | Freq: Once | ORAL | Status: AC
  Administered 2015-06-12: 40 meq via ORAL
  Filled 2015-06-11: qty 2

## 2015-06-11 MED ORDER — HEPARIN (PORCINE) IN NACL 2-0.9 UNIT/ML-% IJ SOLN
INTRAMUSCULAR | Status: AC
  Filled 2015-06-11: qty 1000

## 2015-06-11 MED ORDER — ACETAMINOPHEN 325 MG PO TABS
650.0000 mg | ORAL_TABLET | Freq: Four times a day (QID) | ORAL | Status: DC | PRN
  Administered 2015-06-11 – 2015-06-13 (×3): 650 mg via ORAL
  Filled 2015-06-11 (×3): qty 2

## 2015-06-11 MED ORDER — FENTANYL CITRATE (PF) 100 MCG/2ML IJ SOLN
INTRAMUSCULAR | Status: AC
  Filled 2015-06-11: qty 2

## 2015-06-11 MED ORDER — BIVALIRUDIN 250 MG IV SOLR
INTRAVENOUS | Status: AC
  Filled 2015-06-11: qty 250

## 2015-06-11 MED ORDER — ASPIRIN 300 MG RE SUPP: 300.0000 mg | RECTAL | Status: DC

## 2015-06-11 MED ORDER — NITROGLYCERIN 0.4 MG SL SUBL: 0.4000 mg | SUBLINGUAL_TABLET | SUBLINGUAL | Status: DC | PRN

## 2015-06-11 MED ORDER — ASPIRIN EC 81 MG PO TBEC
81.0000 mg | DELAYED_RELEASE_TABLET | Freq: Every day | ORAL | Status: DC
  Administered 2015-06-12 – 2015-06-13 (×2): 81 mg via ORAL
  Filled 2015-06-11 (×2): qty 1

## 2015-06-11 MED ORDER — PANTOPRAZOLE SODIUM 40 MG PO TBEC
40.0000 mg | DELAYED_RELEASE_TABLET | Freq: Every day | ORAL | Status: DC
  Administered 2015-06-12 – 2015-06-13 (×2): 40 mg via ORAL
  Filled 2015-06-11 (×2): qty 1

## 2015-06-11 MED ORDER — NITROGLYCERIN 1 MG/10 ML FOR IR/CATH LAB
INTRA_ARTERIAL | Status: DC | PRN
  Administered 2015-06-11: 18:00:00

## 2015-06-11 MED ORDER — NITROGLYCERIN IN D5W 200-5 MCG/ML-% IV SOLN
INTRAVENOUS | Status: AC
  Filled 2015-06-11: qty 250

## 2015-06-11 MED ORDER — ONDANSETRON HCL 4 MG/2ML IJ SOLN: 4.0000 mg | Freq: Four times a day (QID) | INTRAMUSCULAR | Status: DC | PRN

## 2015-06-11 MED ORDER — NITROGLYCERIN IN D5W 200-5 MCG/ML-% IV SOLN
INTRAVENOUS | Status: DC | PRN
  Administered 2015-06-11: 5 ug/min via INTRAVENOUS

## 2015-06-11 MED ORDER — ASPIRIN 81 MG PO CHEW: 81.0000 mg | CHEWABLE_TABLET | Freq: Every day | ORAL | Status: DC

## 2015-06-11 MED ORDER — FENTANYL CITRATE (PF) 100 MCG/2ML IJ SOLN
INTRAMUSCULAR | Status: DC | PRN
  Administered 2015-06-11: 25 ug via INTRAVENOUS

## 2015-06-11 MED ORDER — SODIUM CHLORIDE 0.9 % IV SOLN: 250.0000 mL | INTRAVENOUS | Status: DC | PRN

## 2015-06-11 MED ORDER — METHYLPREDNISOLONE SODIUM SUCC 125 MG IJ SOLR
INTRAMUSCULAR | Status: DC | PRN
  Administered 2015-06-11: 125 mg via INTRAVENOUS

## 2015-06-11 MED ORDER — METOPROLOL TARTRATE 12.5 MG HALF TABLET
12.5000 mg | ORAL_TABLET | Freq: Two times a day (BID) | ORAL | Status: DC
  Administered 2015-06-11 – 2015-06-13 (×4): 12.5 mg via ORAL
  Filled 2015-06-11 (×4): qty 1

## 2015-06-11 MED ORDER — DIPHENHYDRAMINE HCL 50 MG/ML IJ SOLN
INTRAMUSCULAR | Status: AC
  Filled 2015-06-11: qty 1

## 2015-06-11 MED ORDER — SODIUM CHLORIDE 0.9 % IJ SOLN: 3.0000 mL | INTRAMUSCULAR | Status: DC | PRN

## 2015-06-11 MED ORDER — ATORVASTATIN CALCIUM 80 MG PO TABS
80.0000 mg | ORAL_TABLET | Freq: Every day | ORAL | Status: DC
  Administered 2015-06-12: 80 mg via ORAL
  Filled 2015-06-11: qty 1

## 2015-06-11 MED ORDER — TICAGRELOR 90 MG PO TABS
90.0000 mg | ORAL_TABLET | Freq: Two times a day (BID) | ORAL | Status: DC
  Administered 2015-06-12 – 2015-06-13 (×3): 90 mg via ORAL
  Filled 2015-06-11 (×3): qty 1

## 2015-06-11 MED ORDER — HEPARIN SODIUM (PORCINE) 1000 UNIT/ML IJ SOLN
INTRAMUSCULAR | Status: AC
  Filled 2015-06-11: qty 1

## 2015-06-11 MED ORDER — TICAGRELOR 90 MG PO TABS
ORAL_TABLET | ORAL | Status: AC
  Filled 2015-06-11: qty 2

## 2015-06-11 MED ORDER — BIVALIRUDIN BOLUS VIA INFUSION - CUPID
INTRAVENOUS | Status: DC | PRN
  Administered 2015-06-11: 51 mg via INTRAVENOUS

## 2015-06-11 MED ORDER — LIDOCAINE HCL (PF) 1 % IJ SOLN
INTRAMUSCULAR | Status: AC
  Filled 2015-06-11: qty 30

## 2015-06-11 MED ORDER — ASPIRIN 81 MG PO CHEW: 324.0000 mg | CHEWABLE_TABLET | ORAL | Status: DC

## 2015-06-11 MED ORDER — METHYLPREDNISOLONE SODIUM SUCC 125 MG IJ SOLR
INTRAMUSCULAR | Status: AC
  Filled 2015-06-11: qty 2

## 2015-06-11 MED ORDER — SODIUM CHLORIDE 0.9 % IJ SOLN
3.0000 mL | Freq: Two times a day (BID) | INTRAMUSCULAR | Status: DC
  Administered 2015-06-11 – 2015-06-12 (×3): 3 mL via INTRAVENOUS

## 2015-06-11 MED ORDER — FAMOTIDINE IN NACL 20-0.9 MG/50ML-% IV SOLN
INTRAVENOUS | Status: DC | PRN
  Administered 2015-06-11: 20 mg via INTRAVENOUS

## 2015-06-11 SURGICAL SUPPLY — 16 items
BALLN EMERGE MR 2.5X15 (BALLOONS) ×2
BALLN ~~LOC~~ EMERGE MR 3.75X15 (BALLOONS) ×2
BALLOON EMERGE MR 2.5X15 (BALLOONS) ×1 IMPLANT
BALLOON ~~LOC~~ EMERGE MR 3.75X15 (BALLOONS) ×1 IMPLANT
CATH INFINITI 5FR MULTPACK ANG (CATHETERS) ×2 IMPLANT
CATH VISTA GUIDE 6FR JR4 (CATHETERS) ×2 IMPLANT
ELECT DEFIB PAD ADLT CADENCE (PAD) ×2 IMPLANT
KIT ENCORE 26 ADVANTAGE (KITS) ×2 IMPLANT
KIT HEART LEFT (KITS) ×2 IMPLANT
PACK CARDIAC CATHETERIZATION (CUSTOM PROCEDURE TRAY) ×2 IMPLANT
SHEATH PINNACLE 6F 10CM (SHEATH) ×2 IMPLANT
STENT XIENCE ALPINE RX 3.5X33 (Permanent Stent) ×2 IMPLANT
SYR MEDRAD MARK V 150ML (SYRINGE) ×2 IMPLANT
TRANSDUCER W/STOPCOCK (MISCELLANEOUS) ×2 IMPLANT
WIRE EMERALD 3MM-J .035X150CM (WIRE) ×2 IMPLANT
WIRE PT2 MS 185 (WIRE) ×2 IMPLANT

## 2015-06-11 NOTE — H&P (Signed)
Mandy Gilbert is an 75 y.o. female.   Chief Complaint: Recurrent chest pain HPI: Patient is 75 year old female with past medical history significant for hypertension, basal cell carcinoma of the skin, degenerative joint disease, came to [facility] by EMS as code STEMI was called. Patient complained of recurrent retrosternal chest pain off and on which started initially on Sunday associated with feeling weak and tired pain lasting few minutes today went to see PMD had EKG earlier this morning which was normal subsequently had CT of the chest which showed no evidence of dissection or acute pulmonary embolism developed CV or chest pain approximately around 1:30 PM associated with diaphoresis called EMS and came to [facility] EKG done on the field showed normal sinus rhythm with ST elevation in inferolateral leads with respirable changes in leads 1 and aVL suggestive of acute inferior wall injury. Patient states chest pain lasted approximately 30-40 minutes received aspirin with partial relief of chest pain.  Past Medical History  Diagnosis Date  . Hypertension   . Constipation   . Cancer (HCC)     Skin cancer legs, basal cell.    Past Surgical History  Procedure Laterality Date  . Colonoscopy    . Lumbar laminectomy/decompression microdiscectomy Right 03/07/2013    Procedure: LUMBAR LAMINECTOMY/DECOMPRESSION MICRODISCECTOMY LUMBAR FOUR-FIVE;  Surgeon: Charlie Pitter, MD;  Location: [place] [facility];  Service: Neurosurgery;  Laterality: Right;    No family history on file. Social History:  reports that she has never smoked. She does not have any smokeless tobacco history on file. She reports that she does not drink alcohol or use illicit drugs.  Allergies:  Allergies  Allergen Reactions  . Adhesive [Tape] Itching and Other (See Comments)    Turns red.  Can use paper tape  . Shrimp [Shellfish Allergy] Nausea And Vomiting  . Sulfa Antibiotics Rash    Medications Prior to  Admission  Medication Sig Dispense Refill  . atorvastatin (LIPITOR) 20 MG tablet Take 20 mg by mouth every morning.    . Cholecalciferol (VITAMIN D PO) Take 1 tablet by mouth every morning.    . diltiazem (DILACOR XR) 240 MG 24 hr capsule Take 240 mg by mouth every morning.    . indapamide (LOZOL) 1.25 MG tablet Take 1.25 mg by mouth every morning.    Marland Kitchen OVER THE COUNTER MEDICATION Take 2 capsules by mouth at bedtime. Stool Softner    . propranolol (INDERAL) 20 MG tablet Take 20 mg by mouth 2 (two) times daily.    . Psyllium (METAMUCIL PO) Take 3 capsules by mouth every morning.    . traMADol (ULTRAM) 50 MG tablet Take 50 mg by mouth every 4 (four) hours as needed for pain.      Results for orders placed or performed during the hospital encounter of 06/11/15 (from the past 48 hour(s))  Troponin I     Status: Abnormal   Collection Time: 06/11/15 12:37 PM  Result Value Ref Range   Troponin I 1.28 (H) <0.031 ng/mL    Comment: READ BACK AND VERIFIED WITH NANCY PURCELL AT 1419 06/11/15 DAS        POSSIBLE MYOCARDIAL ISCHEMIA. SERIAL TESTING RECOMMENDED.    Ct Angio Chest Pe W/cm &/or Wo Cm  06/11/2015  CLINICAL DATA:  Shortness of breath and bilateral chest pain EXAM: CT ANGIOGRAPHY CHEST WITH CONTRAST TECHNIQUE: Multidetector CT imaging of the chest was performed using the standard protocol during bolus administration of intravenous contrast. Multiplanar CT image  reconstructions and MIPs were obtained to evaluate the vascular anatomy. CONTRAST:  70mL OMNIPAQUE IOHEXOL 350 MG/ML SOLN COMPARISON:  Radiography 03/06/2013 FINDINGS: Pulmonary arterial opacification is excellent. There are no pulmonary emboli. There is atherosclerosis of the aorta but no aneurysm or dissection. There is pleural and parenchymal scarring at the lung apices right more than left. No evidence of active infiltrate, collapse or mass lesion. 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 No pleural or pericardial fluid. No hilar or  mediastinal mass or lymphadenopathy. Scans in the upper abdomen are unremarkable. No evidence of regional fracture. Chronic calcified disc herniation at T8-9 not likely of clinical relevance. Review of the MIP images confirms the above findings. IMPRESSION: No pulmonary emboli or other active chest disease. No cause of chest pain is identified. Electronically Signed   By: Nelson Chimes M.D.   On: 06/11/2015 14:25    Review of Systems  Constitutional: Positive for diaphoresis. Negative for weight loss and malaise/fatigue.  Eyes: Negative for double vision and photophobia.  Respiratory: Negative for sputum production and shortness of breath.   Cardiovascular: Positive for chest pain. Negative for palpitations, orthopnea and leg swelling.  Gastrointestinal: Positive for nausea. Negative for abdominal pain and diarrhea.  Genitourinary: Negative for dysuria.  Neurological: Negative for dizziness and headaches.    SpO2 100 %. Physical Exam  Constitutional: She is oriented to person, place, and time.  Eyes: Conjunctivae are normal. Pupils are equal, round, and reactive to light. Left eye exhibits discharge. No scleral icterus.  Neck: Neck supple. No JVD present. No tracheal deviation present. No thyromegaly present.  Cardiovascular: Regular rhythm.   Murmur (soft systolic murmur and S4 gallop noted) heard. Respiratory: Effort normal and breath sounds normal. No respiratory distress. She has no wheezes. She has no rales.  GI: Soft. Bowel sounds are normal. She exhibits no distension. There is no tenderness. There is no rebound.  Musculoskeletal: She exhibits no edema or tenderness.  Neurological: She is alert and oriented to person, place, and time.     Assessment/Plan Acute inferior wall MI Hypertension Degenerative joint disease History of basal cell carcinoma Plan Discussed with patient briefly in the cath lab regarding emergency left cath possible PTCA stenting*and benefits i.e. death MI  stroke local vascular complications etc. and consents for PCI    Charolette Forward 06/11/2015, 6:13 PM

## 2015-06-11 NOTE — Progress Notes (Signed)
Rt groin Level 0 with sheath in place however site is oozing. ACT checked=157. Sheath pulled at 2236 by Lyndee Leo, RN pressure held for 25 minutes. Pt's vital signs stable during the sheath pull. Rt groin Level 0 post sheath pull.Pressure dressing in place. Pt instructed to put pressure on the site if ever she sneeze, cough or laugh. Will continue to monitor patient.

## 2015-06-12 LAB — BASIC METABOLIC PANEL
ANION GAP: 7 (ref 5–15)
Anion gap: 10 (ref 5–15)
BUN: 13 mg/dL (ref 6–20)
BUN: 15 mg/dL (ref 6–20)
CALCIUM: 8.9 mg/dL (ref 8.9–10.3)
CHLORIDE: 104 mmol/L (ref 101–111)
CO2: 27 mmol/L (ref 22–32)
CO2: 28 mmol/L (ref 22–32)
Calcium: 9.2 mg/dL (ref 8.9–10.3)
Chloride: 110 mmol/L (ref 101–111)
Creatinine, Ser: 0.72 mg/dL (ref 0.44–1.00)
Creatinine, Ser: 0.74 mg/dL (ref 0.44–1.00)
GFR calc Af Amer: >60 mL/min (ref >60)
GFR calc Af Amer: >60 mL/min (ref >60)
GFR calc non Af Amer: >60 mL/min (ref >60)
GLUCOSE: 148 mg/dL — AB (ref 65–99)
Glucose, Bld: 204 mg/dL — ABNORMAL HIGH (ref 65–99)
POTASSIUM: 3 mmol/L — AB (ref 3.5–5.1)
Potassium: 4.4 mmol/L (ref 3.5–5.1)
SODIUM: 142 mmol/L (ref 135–145)
Sodium: 144 mmol/L (ref 135–145)

## 2015-06-12 LAB — CBC
HCT: 35.1 % — ABNORMAL LOW (ref 36.0–46.0)
HEMATOCRIT: 36.7 % (ref 36.0–46.0)
Hemoglobin: 12.6 g/dL (ref 12.0–15.0)
Hemoglobin: 12.2 g/dL (ref 12.0–15.0)
MCH: 30.9 pg (ref 26.0–34.0)
MCH: 31.3 pg (ref 26.0–34.0)
MCHC: 34.3 g/dL (ref 30.0–36.0)
MCHC: 34.8 g/dL (ref 30.0–36.0)
MCV: 90 fL (ref 78.0–100.0)
MCV: 90 fL (ref 78.0–100.0)
Platelets: 163 10*3/uL (ref 150–400)
Platelets: 157 10*3/uL (ref 150–400)
RBC: 4.08 MIL/uL (ref 3.87–5.11)
RBC: 3.9 MIL/uL (ref 3.87–5.11)
RDW: 13.8 % (ref 11.5–15.5)
RDW: 13.5 % (ref 11.5–15.5)
WBC: 8.4 10*3/uL (ref 4.0–10.5)
WBC: 6.3 10*3/uL (ref 4.0–10.5)

## 2015-06-12 LAB — TROPONIN I
TROPONIN I: 1.37 ng/mL — AB (ref <0.031)
TROPONIN I: 1.47 ng/mL — AB (ref <0.031)

## 2015-06-12 LAB — HEMOGLOBIN A1C
Hgb A1c MFr Bld: 5.8 % — ABNORMAL HIGH (ref 4.8–5.6)
Mean Plasma Glucose: 120 mg/dL

## 2015-06-12 LAB — LIPID PANEL
CHOL/HDL RATIO: 2.3 ratio
CHOLESTEROL: 173 mg/dL (ref 0–200)
HDL: 74 mg/dL (ref >40)
LDL Cholesterol: 90 mg/dL (ref 0–99)
TRIGLYCERIDES: 44 mg/dL (ref <150)
VLDL: 9 mg/dL (ref 0–40)

## 2015-06-12 LAB — POCT ACTIVATED CLOTTING TIME
Activated Clotting Time: 183 seconds
Activated Clotting Time: 157 seconds

## 2015-06-12 MED ORDER — HEART ATTACK BOUNCING BOOK
Freq: Once | Status: AC
  Administered 2015-06-12: 09:00:00
  Filled 2015-06-12: qty 1

## 2015-06-12 NOTE — Progress Notes (Signed)
Explained to pt bed alarm protocol.  Pt stated she can not sleep with light on (light on bed for bed alarm).  Pt verbalized procedure to call nurse to get OOB, RN number at pt bedside to call.  Will continue to hourly round.

## 2015-06-12 NOTE — Progress Notes (Signed)
CARDIAC REHAB PHASE I   PRE:  Rate/Rhythm: 79 sinus  BP:  Sitting: 140/76     SaO2: 98 RA  MODE:  Ambulation: 350 ft   POST:  Rate/Rhythem: 94 sinus  BP:  Sitting: 137/81     SaO2: 99 RA  Pt ambulated 350 ft with assist x1.  Pt tolerated walk well without complaint.  Reviewed d/c education with pt and husband.  Discussed diet, exercise, restrictions, risk factors, NTG use, and when to call 911/MD.  Also discussed Cardiac Rehab Phase II with referral sent to [facility].  Pt and husband voiced understanding.  Pt encouraged to continue to walk with staff over weekend. Alberteen Sam, MA, ACSM RCEP   Luan Pulling, Nita Sells

## 2015-06-12 NOTE — Progress Notes (Signed)
Subjective:  Doing well denies any chest pain or shortness of breath.  Objective:  Vital Signs in the last 24 hours: Temp:  [97.7 F (36.5 C)-98.7 F (37.1 C)] 97.8 F (36.6 C) (01/07 1100) Pulse Rate:  [0-100] 80 (01/07 1100) Resp:  [0-59] 16 (01/07 1100) BP: (116-167)/(69-101) 132/76 mmHg (01/07 1100) SpO2:  [0 %-100 %] 99 % (01/07 1100) Arterial Line BP: (142-184)/(75-92) 149/80 mmHg (01/06 2228) Weight:  [67.1 kg (147 lb 14.9 oz)] 67.1 kg (147 lb 14.9 oz) (01/06 1854)  Intake/Output from previous day: 01/06 0701 - 01/07 0700 In: 2003.6 [P.O.:220; I.V.:1783.6] Out: 600 [Urine:600] Intake/Output from this shift: Total I/O In: 249 [P.O.:240; I.V.:9] Out: -   Physical Exam: Neck: no adenopathy, no carotid bruit, no JVD and supple, symmetrical, trachea midline Lungs: clear to auscultation bilaterally Heart: regular rate and rhythm, S1, S2 normal and Soft systolic murmur noted Abdomen: soft, non-tender; bowel sounds normal; no masses,  no organomegaly Extremities: extremities normal, atraumatic, no cyanosis or edema and Right groin stable  Lab Results:  Recent Labs  06/12/15 0046 06/12/15 0757  WBC 6.3 8.4  HGB 12.6 12.2  PLT 163 157    Recent Labs  06/12/15 0046 06/12/15 0757  NA 142 144  K 3.0* 4.4  CL 104 110  CO2 28 27  GLUCOSE 204* 148*  BUN 15 13  CREATININE 0.72 0.74    Recent Labs  06/12/15 0046 06/12/15 0757  TROPONINI 1.47* 1.37*   Hepatic Function Panel  Recent Labs  06/11/15 2120  PROT 6.3*  ALBUMIN 3.4*  AST 23  ALT 17  ALKPHOS 68  BILITOT 0.5    Recent Labs  06/12/15 0046  CHOL 173   No results for input(s): PROTIME in the last 72 hours.  Imaging: Imaging results have been reviewed and Ct Angio Chest Pe W/cm &/or Wo Cm  06/11/2015  CLINICAL DATA:  Shortness of breath and bilateral chest pain EXAM: CT ANGIOGRAPHY CHEST WITH CONTRAST TECHNIQUE: Multidetector CT imaging of the chest was performed using the standard protocol  during bolus administration of intravenous contrast. Multiplanar CT image reconstructions and MIPs were obtained to evaluate the vascular anatomy. CONTRAST:  20mL OMNIPAQUE IOHEXOL 350 MG/ML SOLN COMPARISON:  Radiography 03/06/2013 FINDINGS: Pulmonary arterial opacification is excellent. There are no pulmonary emboli. There is atherosclerosis of the aorta but no aneurysm or dissection. There is pleural and parenchymal scarring at the lung apices right more than left. No evidence of active infiltrate, collapse or mass lesion. 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 No pleural or pericardial fluid. No hilar or mediastinal mass or lymphadenopathy. Scans in the upper abdomen are unremarkable. No evidence of regional fracture. Chronic calcified disc herniation at T8-9 not likely of clinical relevance. Review of the MIP images confirms the above findings. IMPRESSION: No pulmonary emboli or other active chest disease. No cause of chest pain is identified. Electronically Signed   By: Nelson Chimes M.D.   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 Phase I cardiac rehabilitation Change diet to heart healthy, modified diet Transfer to telemetry  LOS: 1 day    Charolette Forward 06/12/2015, 12:01 PM

## 2015-06-12 NOTE — Progress Notes (Signed)
   06/12/15 0800  Clinical Encounter Type  Visited With Patient;Patient and family together;Health care provider  Visit Type Initial;Psychological support;Spiritual support;Social support;Patient in surgery;Critical Care;ED  Referral From Care management  Spiritual Encounters  Spiritual Needs Emotional  Stress Factors  Family Stress Factors Exhausted;Loss of control  Advance Directives (For Healthcare)  Does patient have an advance directive? Yes   Chaplain responded to a code stemi via ED. Chaplain walked the [name] to the correct waiting area and provided spiritual and emotional care via prayer

## 2015-06-13 LAB — CBC
HEMATOCRIT: 33.9 % — AB (ref 36.0–46.0)
HEMOGLOBIN: 10.8 g/dL — AB (ref 12.0–15.0)
MCH: 29.8 pg (ref 26.0–34.0)
MCHC: 31.9 g/dL (ref 30.0–36.0)
MCV: 93.4 fL (ref 78.0–100.0)
Platelets: 154 10*3/uL (ref 150–400)
RBC: 3.63 MIL/uL — ABNORMAL LOW (ref 3.87–5.11)
RDW: 14.4 % (ref 11.5–15.5)
WBC: 11.2 10*3/uL — ABNORMAL HIGH (ref 4.0–10.5)

## 2015-06-13 LAB — BASIC METABOLIC PANEL
Anion gap: 9 (ref 5–15)
BUN: 13 mg/dL (ref 6–20)
CALCIUM: 9.4 mg/dL (ref 8.9–10.3)
CHLORIDE: 107 mmol/L (ref 101–111)
CO2: 28 mmol/L (ref 22–32)
CREATININE: 0.81 mg/dL (ref 0.44–1.00)
GFR calc non Af Amer: >60 mL/min (ref >60)
GLUCOSE: 96 mg/dL (ref 65–99)
Potassium: 3.8 mmol/L (ref 3.5–5.1)
Sodium: 144 mmol/L (ref 135–145)

## 2015-06-13 LAB — TROPONIN I: Troponin I: 1.08 ng/mL (ref <0.031)

## 2015-06-13 MED ORDER — TICAGRELOR 90 MG PO TABS: 90.0000 mg | ORAL_TABLET | Freq: Two times a day (BID) | ORAL | Status: DC

## 2015-06-13 MED ORDER — ASPIRIN 81 MG PO TBEC: 81.0000 mg | DELAYED_RELEASE_TABLET | Freq: Every day | ORAL | Status: DC

## 2015-06-13 MED ORDER — ATORVASTATIN CALCIUM 80 MG PO TABS: 80.0000 mg | ORAL_TABLET | Freq: Every day | ORAL | Status: DC

## 2015-06-13 MED ORDER — METOPROLOL TARTRATE 25 MG PO TABS: 25.0000 mg | ORAL_TABLET | Freq: Two times a day (BID) | ORAL | Status: DC

## 2015-06-13 MED ORDER — NITROGLYCERIN 0.4 MG SL SUBL: 0.4000 mg | SUBLINGUAL_TABLET | SUBLINGUAL | Status: AC | PRN

## 2015-06-13 NOTE — Discharge Instructions (Signed)
Acute Coronary Syndrome °Acute coronary syndrome (ACS) is a serious problem in which there is suddenly not enough blood and oxygen supplied to the heart. ACS may mean that one or more of the blood vessels in your heart (coronary arteries) may be blocked. ACS can result in chest pain or a heart attack (myocardial infarction or MI). °CAUSES °This condition is caused by atherosclerosis, which is the buildup of fat and cholesterol (plaque) on the inside of the arteries. Over time, the plaque may narrow or block the artery, and this will lessen blood flow to the heart. Plaque can also become weak and break off within a coronary artery to form a clot and cause a sudden blockage. °RISK FACTORS °The risks factors of this condition include: °· High cholesterol levels. °· High blood pressure (hypertension). °· Smoking. °· Diabetes. °· Age. °· Family history of chest pain, heart disease, or stroke. °· Lack of exercise. °SYMPTOMS °The most common signs of this condition include: °· Chest pain, which can be: °¨ A crushing or squeezing in the chest. °¨ A tightness, pressure, fullness, or heaviness in the chest. °¨ Present for more than a few minutes, or it can stop and recur. °· Pain in the arms, neck, jaw, or back. °· Unexplained heartburn or indigestion. °· Shortness of breath. °· Nausea. °· Sudden cold sweats. °· Feeling light-headed or dizzy. °Sometimes, this condition has no symptoms. °DIAGNOSIS °ACS may be diagnosed through the following tests: °· Electrocardiogram (ECG). °· Blood tests. °· Coronary angiogram. This is a procedure to look at the coronary arteries to see if there is any blockage. °TREATMENT °Treatment for ACS may include: °· Healthy behavioral changes to reduce or control risk factors. °· Medicine. °· Coronary stenting. A stent helps to keep an artery open. °· Coronary angioplasty. This procedure widens a narrowed or blocked artery. °· Coronary artery bypass surgery. This will allow your blood to pass the  blockage (bypass) to reach your heart. °HOME CARE INSTRUCTIONS °Eating and Drinking °· Follow a heart-healthy diet. A dietitian can you help to educate you about healthy food options and changes. °· Use healthy cooking methods such as roasting, grilling, broiling, baking, poaching, steaming, or stir-frying. Talk to a dietitian to learn more about healthy cooking methods. °Medicines °· Take medicines only as directed by your health care provider. °· Do not take the following medicines unless your health care provider approves: °¨ Nonsteroidal anti-inflammatory drugs (NSAIDs), such as ibuprofen, naproxen, or celecoxib. °¨ Vitamin supplements that contain vitamin A, vitamin E, or both. °¨ Hormone replacement therapy that contains estrogen with or without progestin. °· Stop illegal drug use. °Activities °· Follow an exercise program that is approved by your health care provider. °· Plan rest periods when you are fatigued. °Lifestyle °· Do not use any tobacco products, including cigarettes, chewing tobacco, or electronic cigarettes. If you need help quitting, ask your health care provider. °· If you drink alcohol, and your health care provider approves, limit your alcohol intake to no more than 1 drink per day. One drink equals 12 ounces of beer, 5 ounces of wine, or 1½ ounces of hard liquor. °· Learn to manage stress. °· Maintain a healthy weight. Lose weight as approved by your health care provider. °General Instructions °· Manage other health conditions, such as hypertension and diabetes, as directed by your health care provider. °· Keep all follow-up visits as directed by your health care provider. This is important. °· Your health care provider may ask you to monitor your blood   pressure. A blood pressure reading consists of a higher number over a lower number, such as 110 over 72, written as 110/72. Ideally, your blood pressure should be: °¨ Below 140/90 if you have no other medical conditions. °¨ Below 130/80 if  you have diabetes or kidney disease. °SEEK IMMEDIATE MEDICAL CARE IF: °· You have pain in your chest, neck, arm, jaw, stomach, or back that lasts more than a few minutes, is recurring, or is not relieved by taking medicine under your tongue (sublingual nitroglycerin). °· You have profuse sweating without cause. °· You have unexplained: °¨ Heartburn or indigestion. °¨ Shortness of breath or difficulty breathing. °¨ Nausea or vomiting. °¨ Fatigue. °¨ Feelings of nervousness or anxiety. °¨ Weakness. °¨ Diarrhea. °· You have sudden light-headedness or dizziness. °· You faint. °These symptoms may represent a serious problem that is an emergency. Do not wait to see if the symptoms will go away. Get medical help right away. Call your local emergency services (911 in the [place] Do not drive yourself to the clinic or hospital. °  °This information is not intended to replace advice given to you by your health care provider. Make sure you discuss any questions you have with your health care provider. °  °Document Released: 05/22/2005 Document Revised: 06/12/2014 Document Reviewed: 09/23/2013 °Elsevier Interactive Patient Education ©2016 [employer] °Coronary Angiogram With Stent °Coronary angiography with stent placement is a procedure to widen or open a narrow blood vessel of the heart (coronary artery). When a coronary artery becomes partially blocked, it decreases blood flow to that area. This may lead to chest pain or a heart attack (myocardial infarction). Arteries may become blocked by cholesterol buildup (plaque) in the lining or wall.  °A stent is a small piece of metal that looks like a mesh or a spring. Stent placement may be done right after a coronary angiography in which a blocked artery is found or as a treatment for a heart attack.  °LET YOUR HEALTH CARE PROVIDER KNOW ABOUT: °· Any allergies you have.   °· All medicines you are taking, including vitamins, herbs, eye drops, creams, and over-the-counter  medicines.   °· Previous problems you or members of your family have had with the use of anesthetics.   °· Any blood disorders you have.   °· Previous surgeries you have had.   °· Medical conditions you have. °RISKS AND COMPLICATIONS °Generally, coronary angiography with stent is a safe procedure. However, problems can occur and include: °· Damage to the heart or its blood vessels.   °· A return of blockage.   °· Bleeding, infection, or bruising at the insertion site.   °· A collection of blood under the skin (hematoma) at the insertion site. °· Blood clot in another part of the body.   °· Kidney injury.   °· Allergic reaction to the dye or contrast used.   °· Bleeding into the abdomen (retroperitoneal bleeding). °BEFORE THE PROCEDURE °· Do not eat or drink anything after midnight on the night before the procedure or as directed by your health care provider.  °· Ask your health care provider about changing or stopping your regular medicines. This is especially important if you are taking diabetes medicines or blood thinners. °· Your health care provider will make sure you understand the procedure as well as the risks and potential problems associated with the procedure.   °PROCEDURE °· You may be given a medicine to help you relax before and during the procedure (sedative). This medicine will be given through an IV tube that is put into one   of your veins.   °· The area where the catheter will be inserted will be shaved and cleaned. This is usually done in the groin but may be done in the fold of your arm (near your elbow) or in the wrist.    °· A medicine will be given to numb the area where the catheter will be inserted (local anesthetic).   °· The catheter will be inserted into an artery using a guide wire. A type of X-ray (fluoroscopy) will be used to help guide the catheter to the opening of the blocked artery.   °· A dye will then be injected into the catheter, and X-rays will be taken. The dye will help to  show where any narrowing or blockages are located in the heart arteries.   °· A tiny wire will be guided to the blocked spot, and a balloon will be inflated to make the artery wider. The stent will be expanded and will crush the plaque into the wall of the vessel. The stent will hold the area open like a scaffolding and improve the blood flow.   °· Sometimes the artery may be made wider using a laser or other tools to remove plaque.   °· When the blood flow is better, the catheter will be removed. The lining of the artery will grow over the stent, which stays where it was placed.   °AFTER THE PROCEDURE °· If the procedure is done through the leg, you will be kept in bed lying flat for about 6 hours. You will be instructed to not bend or cross your legs.   °· The insertion site will be checked frequently.   °· The pulse in your feet or wrist will be checked frequently.   °· Additional blood tests, X-rays, and electrocardiography may be done. °  °This information is not intended to replace advice given to you by your health care provider. Make sure you discuss any questions you have with your health care provider. °  °Document Released: 11/26/2002 Document Revised: 06/12/2014 Document Reviewed: 10/14/2012 °Elsevier Interactive Patient Education ©2016 Elsevier Inc. ° °

## 2015-06-13 NOTE — Discharge Summary (Signed)
Discharge summary dictated on 06/13/2015 dictation number is [phone]

## 2015-06-13 NOTE — Clinical Documentation Improvement (Signed)
Cardiology  Abnormal Lab/Test Results:  Na+ 174 on admission; K= was 2.7. Please provide diagnoses for these abnormal lab values if indicated and document findings in next progress note; NOT in BPA drop down box. Thanks!  Possible Clinical Conditions associated with below indicators  Hypernatremia  Hypokalemia  Lab error?  Other Condition  Cannot Clinically Determine  Supporting Information:  Treatment Provided:  0.9% NaCl infusion at 150cc/hr  KDur supplement given  Repeat draws: Na+ = 141, K+ = 2.8, 3.0 and 4.4  Please exercise your independent, professional judgment when responding. A specific answer is not anticipated or expected.  Thank You,  Zoila Shutter RN, BSN, [place] [phone]

## 2015-06-13 NOTE — Discharge Summary (Signed)
NAMEBRINA, MEASE NO.:  [id number]  MEDICAL RECORD NO.:  [id number]  LOCATION:  [id number]                        FACILITY:  [facility]  PHYSICIAN:  Nile Prisk N. Terrence Dupont, M.D. DATE OF BIRTH:  10/22/1940  DATE OF ADMISSION:  06/11/2015 DATE OF DISCHARGE:  06/13/2015                              DISCHARGE SUMMARY   ADMITTING DIAGNOSES: 1. Acute inferior wall myocardial infarction. 2. Hypertension. 3. Degenerative joint disease. 4. Hyperlipidemia. 5. History of basal cell carcinoma.  DISCHARGE DIAGNOSES: 1. Status post acute inferior wall myocardial infarction, status post     percutaneous coronary intervention to distal right coronary artery     with excellent angiographic results. 2. Multivessel moderate coronary artery disease. 3. Hypertension. 4. Pre-diabetic. 5. Degenerative joint disease. 6. History of basal cell carcinoma. 7. Hyperlipidemia.  DIET:  Low salt, low cholesterol diet.  ACTIVITY:  Increase activity slowly as tolerated.  Post PCI instructions have been given.  FOLLOWUP:  Follow up with me in 1 week.  The patient will be scheduled for phase 2 cardiac rehab as outpatient.  CONDITION AT DISCHARGE:  Stable.  DISCHARGE MEDICATIONS: 1. Aspirin 81 mg 1 tablet daily. 2. Metoprolol tartrate 25 mg 1 tablet twice daily. 3. Nitrostat 0.4 mg sublingual use as directed. 4. Brilinta 90 mg twice daily. 5. Metamucil 3 capsules every morning as before as needed. 6. Vitamin D 1000 units daily. 7. Atorvastatin 80 mg 1 tablet daily.  The patient has been advised to stop diltiazem, Lozol, and propranolol.  BRIEF HISTORY AND HOSPITAL COURSE:  Ms. Sinagra is a 75 year old female with past medical history significant for hypertension, basal cell carcinoma of the skin, degenerative joint disease, and hyperlipidemia. She came to [facility] ER by EMS as code STEMI was called.  The patient complained of recurrent retrosternal chest pain off and on  which started initially on Sunday associated with feeling weak and tired.  The pain lasted few minutes.  Today, went to see PMD and had EKG earlier this morning which was normal.  Subsequently, had CT of the chest which showed no evidence of __________ or acute pulmonary embolism.  She developed severe chest pain approximately around 1:30 p.m. associated with diaphoresis, called EMS and came [facility].  EKG done on the field showed normal sinus rhythm with ST elevation in inferior leads with reciprocal changes in lead 1 and aVL suggestive of acute inferior wall injury.  The patient states chest pain lasted approximately 30-40 minutes, received aspirin with partial relief of chest pain.  PHYSICAL EXAMINATION:  GENERAL:  She was alert, awake, oriented x3 in no acute distress.  Hemodynamically stable. EYES:  Conjunctivae were pink. NECK:  Supple.  No JVD.  No bruit. LUNGS:  Clear to auscultation without rhonchi or rales. CARDIOVASCULAR:  S1, S2 was normal.  There was soft systolic murmur and S4 gallop. ABDOMEN:  Soft.  Bowel sounds were present.  Nontender. EXTREMITIES:  There was no clubbing, cyanosis, or edema.  LABORATORY DATA:  Her sodium was 141, potassium was 2.8, BUN 16, creatinine 0.84.  Her first set of troponin-I was elevated at 1.28, repeat was 1.08, post PCI it was 1.50, 1.47, 1.37.  Today, is 1.08 which is trending down.  Her last cholesterol was 173, LDL was 90, HDL 74, triglycerides were 44.  Hemoglobin was 12.9, hematocrit 38, white count of 7.1.  Her hemoglobin A1c is pending.  Blood sugar was 177, 204, 148. Repeat fasting blood sugar today is 96.  Her BUN is 13, creatinine 0.81. Her hemoglobin last is 10.8, hematocrit 33.9, white count of 11.2.  EKG showed today and yesterday normal sinus rhythm with very small Q-waves in inferior leads with normal R-waves in inferior leads.  There were minor ST-T wave changes in lateral leads.  BRIEF HOSPITAL COURSE:   The patient was directly brought to cath lab and underwent emergency left cardiac cath with selective left and right coronary angiography and PTCA and stenting to distal RCA as per procedure report.  The patient tolerated the procedure well.  There were no complications.  Postprocedure, the patient did not have any episodes of chest pain during the hospital stay.  Her groin is stable with no evidence of hematoma.  There is mild ecchymosis.  Phase 1 cardiac rehab was called.  The patient is ambulating in the hallway and room without any problems.  The patient did not have any significant arrhythmias during the hospital stay.  The patient will be discharged home and will be followed up in my office in 1 week.  The patient will be scheduled for phase 2 cardiac rehab as outpatient.  The patient will need down the road nuclear stress test to evaluate the significance of mid LAD stenosis in future.  The patient has been also discussed at length regarding diet, lifestyle changes, and compliance with medications.     Allegra Lai. Terrence Dupont, M.D.     MNH/MEDQ  D:  06/13/2015  T:  06/13/2015  Job:  [id number]

## 2015-06-14 ENCOUNTER — Encounter (HOSPITAL_COMMUNITY): Payer: Self-pay | Admitting: Cardiology

## 2015-06-14 LAB — POCT I-STAT, CHEM 8
BUN: 18 mg/dL (ref 6–20)
CREATININE: 0.7 mg/dL (ref 0.44–1.00)
Calcium, Ion: 1.16 mmol/L (ref 1.13–1.30)
Chloride: 91 mmol/L — ABNORMAL LOW (ref 101–111)
GLUCOSE: 160 mg/dL — AB (ref 65–99)
HCT: 52 % — ABNORMAL HIGH (ref 36.0–46.0)
HEMOGLOBIN: 17.7 g/dL — AB (ref 12.0–15.0)
POTASSIUM: 2.7 mmol/L — AB (ref 3.5–5.1)
Sodium: 174 mmol/L (ref 135–145)
TCO2: 14 mmol/L (ref 0–100)

## 2015-06-14 LAB — HEMOGLOBIN A1C
Hgb A1c MFr Bld: 5.8 % — ABNORMAL HIGH (ref 4.8–5.6)
MEAN PLASMA GLUCOSE: 120 mg/dL

## 2015-07-01 ENCOUNTER — Encounter: Payer: Medicare Other | Attending: Cardiology | Admitting: *Deleted

## 2015-07-01 VITALS — BP 128/84 | HR 87 | Ht 65.5 in | Wt 143.0 lb

## 2015-07-01 DIAGNOSIS — I213 ST elevation (STEMI) myocardial infarction of unspecified site: Secondary | ICD-10-CM | POA: Insufficient documentation

## 2015-07-01 DIAGNOSIS — Z9861 Coronary angioplasty status: Secondary | ICD-10-CM | POA: Insufficient documentation

## 2015-07-01 NOTE — Progress Notes (Signed)
Daily Session Note  Patient Details  Name: ANELLY SAMARIN MRN: [id number] Date of Birth: [birth year] Referring Provider:  Charolette Forward, MD  Encounter Date: 07/01/2015  Check In:     Session Check In - 07/01/15 1533    Check-In   Staff Present Gerlene Burdock, RN, BSN;Steven Way, BS, ACSM EP-C, Exercise Physiologist   ER physicians immediately available to respond to emergencies See telemetry face sheet for immediately available ER MD   Medication changes reported     No   Fall or balance concerns reported    No   Warm-up and Cool-down --  6 min walk test done   VAD Patient? No   Pain Assessment   Currently in Pain? No/denies         Goals Met:  Proper associated with RPD/PD & O2 Sat Personal goals reviewed  Goals Unmet:  Not Applicable  Goals Comments: Yanin is a little anxious about starting Cardiac Rehab since she has never done any formal exercising.    Dr. Emily Filbert is Medical Director for [place] and [facility].

## 2015-07-01 NOTE — Progress Notes (Signed)
Cardiac Individual Treatment Plan  Patient Details  Name: Mandy Gilbert MRN: [id number] Date of Birth: [birth year] Referring Provider:  Charolette Forward, MD  Initial Encounter Date: Date: 07/01/15  Visit Diagnosis: ST elevation myocardial infarction (STEMI), unspecified artery ([place])  S/P PTCA (percutaneous transluminal coronary angioplasty)  Patient's Home Medications on Admission:  Current outpatient prescriptions:  .  aspirin EC 81 MG EC tablet, Take 1 tablet (81 mg total) by mouth daily., Disp: 30 tablet, Rfl: 3 .  atorvastatin (LIPITOR) 80 MG tablet, Take 1 tablet (80 mg total) by mouth daily at 6 PM., Disp: 30 tablet, Rfl: 3 .  Cholecalciferol (VITAMIN D PO), Take 1,000 Units by mouth every morning. , Disp: , Rfl:  .  metoprolol tartrate (LOPRESSOR) 25 MG tablet, Take 1 tablet (25 mg total) by mouth 2 (two) times daily., Disp: 60 tablet, Rfl: 3 .  nitroGLYCERIN (NITROSTAT) 0.4 MG SL tablet, Place 1 tablet (0.4 mg total) under the tongue every 5 (five) minutes x 3 doses as needed for chest pain., Disp: 25 tablet, Rfl: 12 .  Psyllium (METAMUCIL PO), Take 3 capsules by mouth every morning., Disp: , Rfl:  .  ticagrelor (BRILINTA) 90 MG TABS tablet, Take 1 tablet (90 mg total) by mouth every 12 (twelve) hours., Disp: 60 tablet, Rfl: 11  Past Medical History: Past Medical History  Diagnosis Date  . Hypertension   . Constipation   . Cancer (HCC)     Skin cancer legs, basal cell.    Tobacco Use: History  Smoking status  . Never Smoker   Smokeless tobacco  . Not on file    Labs: Recent Review Flowsheet Data    Labs for ITP Cardiac and Pulmonary Rehab Latest Ref Rng 06/11/2015 06/11/2015 06/11/2015 06/11/2015 06/12/2015   Cholestrol 0 - 200 mg/dL - 156 - - 173   LDLCALC 0 - 99 mg/dL - 82 - - 90   HDL >40 mg/dL - 63 - - 74   Trlycerides <150 mg/dL - 56 - - 44   Hemoglobin A1c 4.8 - 5.6 % - - 5.8(H) 5.8(H) -   TCO2 0 - 100 mmol/L 14 - - - -       Exercise Target Goals: Date:  07/01/15  Exercise Program Goal: Individual exercise prescription set with THRR, safety & activity barriers. Participant demonstrates ability to understand and report RPE using BORG scale, to self-measure pulse accurately, and to acknowledge the importance of the exercise prescription.  Exercise Prescription Goal: Starting with aerobic activity 30 plus minutes a day, 3 days per week for initial exercise prescription. Provide home exercise prescription and guidelines that participant acknowledges understanding prior to discharge.  Activity Barriers & Risk Stratification:     Activity Barriers & Risk Stratification - 07/01/15 1531    Activity Barriers & Risk Stratification   Activity Barriers Arthritis;Back Problems   Risk Stratification Moderate      6 Minute Walk:     6 Minute Walk      07/01/15 1452       6 Minute Walk   Phase Initial     Distance 1300 feet     Walk Time 6 minutes     Resting HR 87 bpm     Resting BP 128/84 mmHg     Max Ex. HR 106 bpm     Max Ex. BP 142/74 mmHg     RPE 11     Symptoms No        Initial Exercise Prescription:  Initial Exercise Prescription - 07/01/15 1400    Date of Initial Exercise Prescription   Date 07/01/15   Treadmill   MPH 2.5   Grade 0   Minutes 10   Recumbant Bike   Level 2   RPM 40   Watts 20   Minutes 10   NuStep   Level 2   Watts 40   Minutes 10   Arm Ergometer   Level 1   Watts 10   Minutes 10   Recumbant Elliptical   Level 2   RPM 40   Watts 20   Minutes 10   Elliptical   Level 1   Speed 3   Minutes 1   REL-XR   Level 2   Watts 50   Minutes 10   T5 Nustep   Level 1   Watts 15   Minutes 10   Biostep-RELP   Level 2   Watts 40   Minutes 10   Prescription Details   Frequency (times per week) 3   Duration Progress to 30 minutes of continuous aerobic without signs/symptoms of physical distress   Intensity   THRR REST +  30   Ratings of Perceived Exertion 11-15   Perceived Dyspnea 2-4    Progression Continue progressive overload as per policy without signs/symptoms or physical distress.   Resistance Training   Training Prescription Yes   Weight 2   Reps 10-15      Exercise Prescription Changes:   Discharge Exercise Prescription (Final Exercise Prescription Changes):   Nutrition:  Target Goals: Understanding of nutrition guidelines, daily intake of sodium 1500mg , cholesterol 200mg , calories 30% from fat and 7% or less from saturated fats, daily to have 5 or more servings of fruits and vegetables.  Biometrics:     Pre Biometrics - 07/01/15 1455    Pre Biometrics   Height 5' 5.5" (1.664 m)   Weight 143 lb (64.864 kg)   Waist Circumference 37 inches   Hip Circumference 42.5 inches   Waist to Hip Ratio 0.87 %   BMI (Calculated) 23.5       Nutrition Therapy Plan and Nutrition Goals:     Nutrition Therapy & Goals - 07/01/15 1535    Nutrition Therapy   Drug/Food Interactions Statins/Certain Fruits   Intervention Plan   Intervention Using nutrition plan and personal goals to gain a healthy nutrition lifestyle. Add exercise as prescribed.      Nutrition Discharge: Rate Your Plate Scores:   Nutrition Goals Re-Evaluation:   Psychosocial: Target Goals: Acknowledge presence or absence of depression, maximize coping skills, provide positive support system. Participant is able to verbalize types and ability to use techniques and skills needed for reducing stress and depression.  Initial Review & Psychosocial Screening:     Initial Psych Review & Screening - 07/01/15 [place]? Yes   Comments Mandy Gilbert said she is busy all the time but expect for walking she has not done any formal exercise and is anxious what to expect. Mandy Gilbert is very active volunteering at her church and [employer] etc. Very supportive husband. Very   Screening Interventions   Interventions Encouraged to exercise      Quality of Life Scores:      Quality of Life - 07/01/15 1537    Quality of Life Scores   Health/Function Pre 14.89 %   Socioeconomic Pre 25.8 %   Psych/Spiritual Pre 24.64 %   Family Pre 27.6 %  GLOBAL Pre 20.9 %      PHQ-9:     Recent Review Flowsheet Data    Depression screen [facility] 2/9 07/01/2015   Decreased Interest 1   Down, Depressed, Hopeless 1   PHQ - 2 Score 2   Altered sleeping 1   Tired, decreased energy 2   Change in appetite 2   Feeling bad or failure about yourself  1   Trouble concentrating 1   Moving slowly or fidgety/restless 0   Suicidal thoughts 0   PHQ-9 Score 9      Psychosocial Evaluation and Intervention:   Psychosocial Re-Evaluation:   Vocational Rehabilitation: Provide vocational rehab assistance to qualifying candidates.   Vocational Rehab Evaluation & Intervention:     Vocational Rehab - 07/01/15 1533    Initial Vocational Rehab Evaluation & Intervention   Assessment shows need for Vocational Rehabilitation No      Education: Education Goals: Education classes will be provided on a weekly basis, covering required topics. Participant will state understanding/return demonstration of topics presented.  Learning Barriers/Preferences:     Learning Barriers/Preferences - 07/01/15 1532    Learning Barriers/Preferences   Learning Barriers None   Learning Preferences Video;Written Material      Education Topics: General Nutrition Guidelines/Fats and Fiber: -Group instruction provided by verbal, written material, models and posters to present the general guidelines for heart healthy nutrition. Gives an explanation and review of dietary fats and fiber.   Controlling Sodium/Reading Food Labels: -Group verbal and written material supporting the discussion of sodium use in heart healthy nutrition. Review and explanation with models, verbal and written materials for utilization of the food label.   Exercise Physiology & Risk Factors: - Group verbal and written  instruction with models to review the exercise physiology of the cardiovascular system and associated critical values. Details cardiovascular disease risk factors and the goals associated with each risk factor.   Aerobic Exercise & Resistance Training: - Gives group verbal and written discussion on the health impact of inactivity. On the components of aerobic and resistive training programs and the benefits of this training and how to safely progress through these programs.   Flexibility, Balance, General Exercise Guidelines: - Provides group verbal and written instruction on the benefits of flexibility and balance training programs. Provides general exercise guidelines with specific guidelines to those with heart or lung disease. Demonstration and skill practice provided.   Stress Management: - Provides group verbal and written instruction about the health risks of elevated stress, cause of high stress, and healthy ways to reduce stress.   Depression: - Provides group verbal and written instruction on the correlation between heart/lung disease and depressed mood, treatment options, and the stigmas associated with seeking treatment.   Anatomy & Physiology of the Heart: - Group verbal and written instruction and models provide basic cardiac anatomy and physiology, with the coronary electrical and arterial systems. Review of: AMI, Angina, Valve disease, Heart Failure, Cardiac Arrhythmia, Pacemakers, and the ICD.   Cardiac Procedures: - Group verbal and written instruction and models to describe the testing methods done to diagnose heart disease. Reviews the outcomes of the test results. Describes the treatment choices: Medical Management, Angioplasty, or Coronary Bypass Surgery.   Cardiac Medications: - Group verbal and written instruction to review commonly prescribed medications for heart disease. Reviews the medication, class of the drug, and side effects. Includes the steps to properly  store meds and maintain the prescription regimen.   Go Sex-Intimacy & Heart Disease, Get SMART -  Goal Setting: - Group verbal and written instruction through game format to discuss heart disease and the return to sexual intimacy. Provides group verbal and written material to discuss and apply goal setting through the application of the S.M.A.R.T. Method.   Other Matters of the Heart: - Provides group verbal, written materials and models to describe Heart Failure, Angina, Valve Disease, and Diabetes in the realm of heart disease. Includes description of the disease process and treatment options available to the cardiac patient.   Exercise & Equipment Safety: - Individual verbal instruction and demonstration of equipment use and safety with use of the equipment.          Cardiac Rehab from 07/01/2015 in [facility] Cardiac and Pulmonary Rehab   Date  07/01/15   Educator  C. EnterkinRN   Instruction Review Code  1- partially meets, needs review/practice      Infection Prevention: - Provides verbal and written material to individual with discussion of infection control including proper hand washing and proper equipment cleaning during exercise session.      Cardiac Rehab from 07/01/2015 in [facility] Cardiac and Pulmonary Rehab   Date  07/01/15   Educator  C. EnterkinRN   Instruction Review Code  2- meets goals/outcomes      Falls Prevention: - Provides verbal and written material to individual with discussion of falls prevention and safety.      Cardiac Rehab from 07/01/2015 in [facility] Cardiac and Pulmonary Rehab   Date  07/01/15   Educator  C. Raychelle Hudman, RN   Instruction Review Code  2- meets goals/outcomes      Diabetes: - Individual verbal and written instruction to review signs/symptoms of diabetes, desired ranges of glucose level fasting, after meals and with exercise. Advice that pre and post exercise glucose checks will be done for 3 sessions at entry of program.    Knowledge  Questionnaire Score:     Knowledge Questionnaire Score - 07/01/15 1532    Knowledge Questionnaire Score   Pre Score 24      Personal Goals and Risk Factors at Admission:     Personal Goals and Risk Factors at Admission - 07/01/15 1535    Personal Goals and Risk Factors on Admission   Increase Aerobic Exercise and Physical Activity Yes   Intervention While in program, learn and follow the exercise prescription taught. Start at a low level workload and increase workload after able to maintain previous level for 30 minutes. Increase time before increasing intensity.   Take Less Medication Yes   Intervention Learn your risk factors and begin the lifestyle modifications for risk factor control during your time in the program.   Understand more about Heart/Pulmonary Disease. Yes   Diabetes No   Hypertension Yes   Goal Participant will see blood pressure controlled within the values of 140/23mm/Hg or within value directed by their physician.   Intervention Provide nutrition & aerobic exercise along with prescribed medications to achieve BP 140/90 or less.   Lipids Yes   Goal Cholesterol controlled with medications as prescribed, with individualized exercise RX and with personalized nutrition plan. Value goals: LDL < 70mg , HDL > 40mg . Participant states understanding of desired cholesterol values and following prescriptions.   Intervention Provide nutrition & aerobic exercise along with prescribed medications to achieve LDL 70mg , HDL >40mg .      Personal Goals and Risk Factors Review:    Personal Goals Discharge (Final Personal Goals and Risk Factors Review):    ITP Comments:   Comments: Delora  is ready to start exercising on Feb 1st.

## 2015-07-01 NOTE — Addendum Note (Signed)
Addended by: Gerlene Burdock on: 07/01/2015 04:32 PM   Modules accepted: Orders

## 2015-07-01 NOTE — Patient Instructions (Signed)
Patient Instructions  Patient Details  Name: Mandy Gilbert MRN: [id number] Date of Birth: 1940-09-14 Referring Provider:  Charolette Forward, MD  Below are the personal goals you chose as well as exercise and nutrition goals. Our goal is to help you keep on track towards obtaining and maintaining your goals. We will be discussing your progress on these goals with you throughout the program.  Initial Exercise Prescription:     Initial Exercise Prescription - 07/01/15 1400    Date of Initial Exercise Prescription   Date 07/01/15   Treadmill   MPH 2.5   Grade 0   Minutes 10   Recumbant Bike   Level 2   RPM 40   Watts 20   Minutes 10   NuStep   Level 2   Watts 40   Minutes 10   Arm Ergometer   Level 1   Watts 10   Minutes 10   Recumbant Elliptical   Level 2   RPM 40   Watts 20   Minutes 10   Elliptical   Level 1   Speed 3   Minutes 1   REL-XR   Level 2   Watts 50   Minutes 10   T5 Nustep   Level 1   Watts 15   Minutes 10   Biostep-RELP   Level 2   Watts 40   Minutes 10   Prescription Details   Frequency (times per week) 3   Duration Progress to 30 minutes of continuous aerobic without signs/symptoms of physical distress   Intensity   THRR REST +  30   Ratings of Perceived Exertion 11-15   Perceived Dyspnea 2-4   Progression Continue progressive overload as per policy without signs/symptoms or physical distress.   Resistance Training   Training Prescription Yes   Weight 2   Reps 10-15      Exercise Goals: Frequency: Be able to perform aerobic exercise three times per week working toward 3-5 days per week.  Intensity: Work with a perceived exertion of 11 (fairly light) - 15 (hard) as tolerated. Follow your new exercise prescription and watch for changes in prescription as you progress with the program. Changes will be reviewed with you when they are made.  Duration: You should be able to do 30 minutes of continuous aerobic exercise in addition to a 5  minute warm-up and a 5 minute cool-down routine.  Nutrition Goals: Your personal nutrition goals will be established when you do your nutrition analysis with the dietician.  The following are nutrition guidelines to follow: Cholesterol < 200mg /day Sodium < 1500mg /day Fiber: Women under 50 yrs - 25 grams per day  Personal Goals:     Personal Goals and Risk Factors at Admission - 07/01/15 1535    Personal Goals and Risk Factors on Admission   Increase Aerobic Exercise and Physical Activity Yes   Intervention While in program, learn and follow the exercise prescription taught. Start at a low level workload and increase workload after able to maintain previous level for 30 minutes. Increase time before increasing intensity.   Take Less Medication Yes   Intervention Learn your risk factors and begin the lifestyle modifications for risk factor control during your time in the program.   Understand more about Heart/Pulmonary Disease. Yes   Diabetes No   Hypertension Yes   Goal Participant will see blood pressure controlled within the values of 140/64mm/Hg or within value directed by their physician.   Intervention Provide nutrition & aerobic  exercise along with prescribed medications to achieve BP 140/90 or less.   Lipids Yes   Goal Cholesterol controlled with medications as prescribed, with individualized exercise RX and with personalized nutrition plan. Value goals: LDL < 70mg , HDL > 40mg . Participant states understanding of desired cholesterol values and following prescriptions.   Intervention Provide nutrition & aerobic exercise along with prescribed medications to achieve LDL 70mg , HDL >40mg .      Tobacco Use Initial Evaluation: History  Smoking status  . Never Smoker   Smokeless tobacco  . Not on file    Copy of goals given to participant.

## 2015-07-02 NOTE — Addendum Note (Signed)
Addended by: Lynford Humphrey on: 07/02/2015 07:45 AM   Modules accepted: Orders

## 2015-07-07 ENCOUNTER — Encounter: Payer: Medicare Other | Attending: Cardiology

## 2015-07-07 DIAGNOSIS — Z9861 Coronary angioplasty status: Secondary | ICD-10-CM | POA: Diagnosis present

## 2015-07-07 DIAGNOSIS — I213 ST elevation (STEMI) myocardial infarction of unspecified site: Secondary | ICD-10-CM | POA: Diagnosis present

## 2015-07-07 NOTE — Progress Notes (Signed)
Daily Session Note  Patient Details  Name: CRESTINA STRIKE MRN: [id number] Date of Birth: [birth year] Referring Provider:  Charolette Forward, MD  Encounter Date: 07/07/2015  Check In:     Session Check In - 07/07/15 0900    Check-In   Staff Present Heath Lark, RN, BSN, CCRP;Renee Dillard [place], MS, ACSM CEP, Exercise Physiologist;Jakya Dovidio, BS, ACSM EP-C, Exercise Physiologist   ER physicians immediately available to respond to emergencies See telemetry face sheet for immediately available ER MD   Medication changes reported     No   Fall or balance concerns reported    No   Warm-up and Cool-down Performed on first and last piece of equipment   VAD Patient? No   Pain Assessment   Currently in Pain? No/denies         Goals Met:  Proper associated with RPD/PD & O2 Sat Exercise tolerated well No report of cardiac concerns or symptoms Strength training completed today  Goals Unmet:  Not Applicable  Goals Comments:    Dr. Emily Filbert is Medical Director for [place] and [facility].

## 2015-07-07 NOTE — Addendum Note (Signed)
Addended by: Lynford Humphrey on: 07/07/2015 10:56 AM   Modules accepted: Orders

## 2015-07-09 ENCOUNTER — Encounter: Payer: Medicare Other | Admitting: *Deleted

## 2015-07-09 DIAGNOSIS — Z9861 Coronary angioplasty status: Secondary | ICD-10-CM

## 2015-07-09 DIAGNOSIS — I213 ST elevation (STEMI) myocardial infarction of unspecified site: Secondary | ICD-10-CM | POA: Diagnosis not present

## 2015-07-09 NOTE — Progress Notes (Signed)
Daily Session Note  Patient Details  Name: Mandy Gilbert MRN: [id number] Date of Birth: Apr 20, 1941 Referring Provider:  Charolette Forward, MD  Encounter Date: 07/09/2015  Check In:     Session Check In - 07/09/15 0927    Check-In   Staff Present Gerlene Burdock, RN, Drusilla Kanner, MS, ACSM CEP, Exercise Physiologist;Susanne Bice, RN, BSN, [facility]   ER physicians immediately available to respond to emergencies See telemetry face sheet for immediately available ER MD   Medication changes reported     No   Fall or balance concerns reported    No   Warm-up and Cool-down Performed on first and last piece of equipment   VAD Patient? No   Pain Assessment   Currently in Pain? No/denies   Multiple Pain Sites No         Goals Met:  Independence with exercise equipment Exercise tolerated well No report of cardiac concerns or symptoms Strength training completed today  Goals Unmet:  Not Applicable  Goals Comments: Patient completed exercise prescription and all exercise goals during rehab session. The exercise was tolerated well and the patient is progressing in the program.    Dr. Emily Filbert is Medical Director for [place] and [facility].

## 2015-07-12 ENCOUNTER — Encounter: Payer: Medicare Other | Admitting: *Deleted

## 2015-07-12 ENCOUNTER — Encounter: Payer: Self-pay | Admitting: Podiatry

## 2015-07-12 ENCOUNTER — Ambulatory Visit (INDEPENDENT_AMBULATORY_CARE_PROVIDER_SITE_OTHER): Payer: Medicare Other | Admitting: Podiatry

## 2015-07-12 VITALS — BP 148/76 | HR 56 | Resp 12

## 2015-07-12 DIAGNOSIS — M79676 Pain in unspecified toe(s): Secondary | ICD-10-CM

## 2015-07-12 DIAGNOSIS — Z9861 Coronary angioplasty status: Secondary | ICD-10-CM

## 2015-07-12 DIAGNOSIS — Q828 Other specified congenital malformations of skin: Secondary | ICD-10-CM | POA: Diagnosis not present

## 2015-07-12 DIAGNOSIS — M79673 Pain in unspecified foot: Secondary | ICD-10-CM

## 2015-07-12 DIAGNOSIS — B351 Tinea unguium: Secondary | ICD-10-CM

## 2015-07-12 DIAGNOSIS — I213 ST elevation (STEMI) myocardial infarction of unspecified site: Secondary | ICD-10-CM

## 2015-07-12 NOTE — Progress Notes (Signed)
   Subjective:    Patient ID: Mandy Gilbert, female    DOB: 1941/01/24, 75 y.o.   MRN: [id number]  HPI: She presents today requesting toenail debridement because she is on blood thinner and has had recent heart or injury. She states that her cardiologist does not want her trimming her own toenails.  Review of Systems  Musculoskeletal: Positive for back pain.  Skin: Positive for color change.       Objective:   Physical Exam: Pulses are strongly palpable bilateral vital signs are stable alert and oriented 3. Neurologic sensorium is intact per Semmes-Weinstein monofilament. A tendon reflexes are intact bilateral and muscle strength is 5 over 5 dorsiflexion plantar flexors and inverters everters all intrinsic musculature is okay. Orthopedic evaluation demonstrates all joints distal to the ankle for range of motion without crepitation. Moderate to be deformity hammertoe deformities are present bilateral but nonpainful. Cutaneous evaluation of Mr. supple well-hydrated cutis mild xerotic skin to the plantar aspect of the bilateral foot otherwise the nails are thick yellow dystrophic onychomycotic. No open wounds or lesions.        Assessment & Plan:  Pain in limb secondary to onychomycosis and skin fissures bilateral.  Plan: Debridement of toenails 1 through 5 bilateral is covered service secondary to pain and recommended over-the-counter ointment for xerosis.

## 2015-07-12 NOTE — Progress Notes (Signed)
Incomplete Session Note  Patient Details  Name: Mandy Gilbert MRN: [id number] Date of Birth: Oct 22, 1940 Referring Provider:  Charolette Forward, MD  [name] did not complete her rehab session.  Because of arrhythmia concerns noticed on arrival to program. Spoke with her cardiologist,Dr. Terrence Dupont, and she is to go home increase her dose of Metoprolol and see him tomorrow. She was advised to call 911 if she has symptoms or concerns before the appointment tomorrow. She stated understanding of the med change spoken to her by Dr Terrence Dupont this am.

## 2015-07-19 ENCOUNTER — Encounter: Payer: Medicare Other | Admitting: *Deleted

## 2015-07-19 DIAGNOSIS — I213 ST elevation (STEMI) myocardial infarction of unspecified site: Secondary | ICD-10-CM

## 2015-07-19 DIAGNOSIS — Z9861 Coronary angioplasty status: Secondary | ICD-10-CM

## 2015-07-19 NOTE — Progress Notes (Signed)
Daily Session Note  Patient Details  Name: Mandy Gilbert MRN: [id number] Date of Birth: 05-12-1941 Referring Provider:  Charolette Forward, MD  Encounter Date: 07/19/2015  Check In:     Session Check In - 07/19/15 0840    Check-In   Location [facility]-Cardiac & Pulmonary Rehab   Staff Present Candiss Norse, MS, ACSM CEP, Exercise Physiologist;Susanne Bice, RN, BSN, Laveda Norman, BS, ACSM CEP, Exercise Physiologist   Supervising physician immediately available to respond to emergencies See telemetry face sheet for immediately available ER MD   Medication changes reported     Yes   Comments Added 3rd 25 mg dose of metoprolol    Fall or balance concerns reported    No   Warm-up and Cool-down Performed on first and last piece of equipment   Resistance Training Performed Yes   VAD Patient? No   Pain Assessment   Currently in Pain? No/denies   Multiple Pain Sites No         Goals Met:  Independence with exercise equipment Exercise tolerated well No report of cardiac concerns or symptoms Strength training completed today  Goals Unmet:  Not Applicable  Goals Comments: Patient completed exercise prescription and all exercise goals during rehab session. The exercise was tolerated well and the patient is progressing in the program.     Dr. Emily Filbert is Medical Director for [place] and [facility].

## 2015-07-19 NOTE — Progress Notes (Deleted)
Daily Session Note  Patient Details  Name: Mandy Gilbert MRN: [id number] Date of Birth: Jan 10, 1941 Referring Provider:  Charolette Forward, MD  Encounter Date: 07/19/2015  Check In:     Session Check In - 07/19/15 0840    Check-In   Location [facility]   Staff Present Candiss Norse, MS, ACSM CEP, Exercise Physiologist;Susanne Bice, RN, BSN, Laveda Norman, BS, ACSM CEP, Exercise Physiologist   Supervising physician immediately available to respond to emergencies See telemetry face sheet for immediately available ER MD   Medication changes reported     No   Fall or balance concerns reported    No   Warm-up and Cool-down Performed on first and last piece of equipment   Resistance Training Performed Yes   VAD Patient? No   Pain Assessment   Currently in Pain? No/denies   Multiple Pain Sites No         Goals Met:  Independence with exercise equipment Exercise tolerated well No report of cardiac concerns or symptoms Strength training completed today  Goals Unmet:  Not Applicable  Goals Comments: Patient completed exercise prescription and all exercise goals during rehab session. The exercise was tolerated well and the patient is progressing in the program.     Dr. Emily Filbert is Medical Director for [place] and [facility].

## 2015-07-21 ENCOUNTER — Encounter: Payer: Medicare Other | Admitting: *Deleted

## 2015-07-21 DIAGNOSIS — Z9861 Coronary angioplasty status: Secondary | ICD-10-CM

## 2015-07-21 DIAGNOSIS — I213 ST elevation (STEMI) myocardial infarction of unspecified site: Secondary | ICD-10-CM | POA: Diagnosis not present

## 2015-07-21 NOTE — Progress Notes (Signed)
Daily Session Note  Patient Details  Name: Mandy Gilbert MRN: [id number] Date of Birth: 1940-06-13 Referring Provider:  Charolette Forward, MD  Encounter Date: 07/21/2015  Check In:     Session Check In - 07/21/15 0936    Check-In   Location [facility]   Staff Present Carson Myrtle, BS, RRT, Respiratory Therapist;Cheryn Lundquist Dillard Essex, MS, ACSM CEP, Exercise Physiologist;Susanne Bice, RN, BSN, CCRP   Supervising physician immediately available to respond to emergencies See telemetry face sheet for immediately available ER MD   Medication changes reported     No   Fall or balance concerns reported    No   Warm-up and Cool-down Performed on first and last piece of equipment   Resistance Training Performed Yes   VAD Patient? No   Pain Assessment   Currently in Pain? No/denies   Multiple Pain Sites No           Exercise Prescription Changes - 07/21/15 0900    Exercise Review   Progression Yes   Response to Exercise   Symptoms None   Comments Added the TM to her routine and did very well!   Duration Progress to 30 minutes of continuous aerobic without signs/symptoms of physical distress   Intensity Rest + 30   Progression Continue progressive overload as per policy without signs/symptoms or physical distress.   Resistance Training   Training Prescription Yes   Weight 2   Reps 10-15   Interval Training   Interval Training No   Treadmill   MPH 1.5   Grade 0   Minutes 10   NuStep   Level 3   Watts 45   Minutes 20   REL-XR   Level 3   Watts 50   Minutes 20      Goals Met:  Independence with exercise equipment Exercise tolerated well Personal goals reviewed No report of cardiac concerns or symptoms Strength training completed today  Goals Unmet:  Not Applicable  Goals Comments: Patient completed exercise prescription and all exercise goals during rehab session. The exercise was tolerated well and the patient is progressing in the program.     Dr. Emily Filbert is Medical Director for [place] and [facility].

## 2015-07-23 ENCOUNTER — Encounter: Payer: Medicare Other | Admitting: *Deleted

## 2015-07-23 DIAGNOSIS — I213 ST elevation (STEMI) myocardial infarction of unspecified site: Secondary | ICD-10-CM | POA: Diagnosis not present

## 2015-07-23 DIAGNOSIS — Z9861 Coronary angioplasty status: Secondary | ICD-10-CM

## 2015-07-23 NOTE — Progress Notes (Signed)
Daily Session Note  Patient Details  Name: Mandy Gilbert MRN: [id number] Date of Birth: 07/29/1940 Referring Provider:  Charolette Forward, MD  Encounter Date: 07/23/2015  Check In:     Session Check In - 07/23/15 0857    Check-In   Location [facility]   Staff Present Heath Lark, RN, BSN, CCRP;Renee Dillard Essex, MS, ACSM CEP, Exercise Physiologist;Jamond Neels, RN, BSN   Supervising physician immediately available to respond to emergencies See telemetry face sheet for immediately available ER MD   Medication changes reported     No   Fall or balance concerns reported    No   Warm-up and Cool-down Performed on first and last piece of equipment   Resistance Training Performed Yes   VAD Patient? No   Pain Assessment   Currently in Pain? No/denies         Goals Met:  Proper associated with RPD/PD & O2 Sat Exercise tolerated well  Goals Unmet:  Not Applicable  Goals Comments:    Dr. Emily Filbert is Medical Director for [place] [facility].

## 2015-07-26 ENCOUNTER — Encounter: Payer: Medicare Other | Admitting: *Deleted

## 2015-07-26 DIAGNOSIS — Z9861 Coronary angioplasty status: Secondary | ICD-10-CM

## 2015-07-26 NOTE — Progress Notes (Signed)
Daily Session Note  Patient Details  Name: Mandy Gilbert MRN: [id number] Date of Birth: 1940/08/19 Referring Provider:  Charolette Forward, MD  Encounter Date: 07/26/2015  Check In:     Session Check In - 07/26/15 0821    Check-In   Location [facility]   Staff Present Candiss Norse, MS, ACSM CEP, Exercise Physiologist;Susanne Bice, RN, BSN, Laveda Norman, BS, ACSM CEP, Exercise Physiologist   Supervising physician immediately available to respond to emergencies See telemetry face sheet for immediately available ER MD   Medication changes reported     No   Fall or balance concerns reported    No   Warm-up and Cool-down Not performed (comment)  Had to leave class early, only stayed for education, no exercise.   Resistance Training Performed No   VAD Patient? No   Pain Assessment   Currently in Pain? No/denies   Multiple Pain Sites No         Goals Met:  No exercise today, education only. Had to leave early due to appointment  Goals Unmet:  Not Applicable  Goals Comments: Education only, no exercise   Dr. Emily Filbert is Medical Director for [place] [facility].

## 2015-07-27 NOTE — Progress Notes (Signed)
Cardiac Individual Treatment Plan  Patient Details  Name: ASANTE RITACCO MRN: [id number] Date of Birth: 1940/10/22 Referring Provider:  Charolette Forward, MD  Initial Encounter Date:    Visit Diagnosis: S/P PTCA (percutaneous transluminal coronary angioplasty)  Patient's Home Medications on Admission:  Current outpatient prescriptions:  .  aspirin EC 81 MG EC tablet, Take 1 tablet (81 mg total) by mouth daily., Disp: 30 tablet, Rfl: 3 .  atorvastatin (LIPITOR) 20 MG tablet, , Disp: , Rfl:  .  atorvastatin (LIPITOR) 80 MG tablet, Take 1 tablet (80 mg total) by mouth daily at 6 PM., Disp: 30 tablet, Rfl: 3 .  Cholecalciferol (VITAMIN D PO), Take 1,000 Units by mouth every morning. , Disp: , Rfl:  .  DILT-XR 240 MG 24 hr capsule, , Disp: , Rfl:  .  indapamide (LOZOL) 1.25 MG tablet, , Disp: , Rfl:  .  metoprolol tartrate (LOPRESSOR) 25 MG tablet, Take 1 tablet (25 mg total) by mouth 2 (two) times daily. (Patient taking differently: Take 25 mg by mouth 3 (three) times daily. ), Disp: 60 tablet, Rfl: 3 .  nitroGLYCERIN (NITROSTAT) 0.4 MG SL tablet, Place 1 tablet (0.4 mg total) under the tongue every 5 (five) minutes x 3 doses as needed for chest pain., Disp: 25 tablet, Rfl: 12 .  propranolol (INDERAL) 20 MG tablet, , Disp: , Rfl:  .  Psyllium (METAMUCIL PO), Take 3 capsules by mouth every morning., Disp: , Rfl:  .  ticagrelor (BRILINTA) 90 MG TABS tablet, Take 1 tablet (90 mg total) by mouth every 12 (twelve) hours., Disp: 60 tablet, Rfl: 11  Past Medical History: Past Medical History  Diagnosis Date  . Hypertension   . Constipation   . Cancer (HCC)     Skin cancer legs, basal cell.    Tobacco Use: History  Smoking status  . Never Smoker   Smokeless tobacco  . Not on file    Labs: Recent Review Flowsheet Data    Labs for ITP Cardiac and Pulmonary Rehab Latest Ref Rng 06/11/2015 06/11/2015 06/11/2015 06/11/2015 06/12/2015   Cholestrol 0 - 200 mg/dL - 156 - - 173   LDLCALC 0 - 99 mg/dL -  82 - - 90   HDL >40 mg/dL - 63 - - 74   Trlycerides <150 mg/dL - 56 - - 44   Hemoglobin A1c 4.8 - 5.6 % - - 5.8(H) 5.8(H) -   TCO2 0 - 100 mmol/L 14 - - - -       Exercise Target Goals:    Exercise Program Goal: Individual exercise prescription set with THRR, safety & activity barriers. Participant demonstrates ability to understand and report RPE using BORG scale, to self-measure pulse accurately, and to acknowledge the importance of the exercise prescription.  Exercise Prescription Goal: Starting with aerobic activity 30 plus minutes a day, 3 days per week for initial exercise prescription. Provide home exercise prescription and guidelines that participant acknowledges understanding prior to discharge.  Activity Barriers & Risk Stratification:     Activity Barriers & Cardiac Risk Stratification - 07/01/15 1531    Activity Barriers & Cardiac Risk Stratification   Activity Barriers Arthritis;Back Problems   Cardiac Risk Stratification Moderate      6 Minute Walk:     6 Minute Walk      07/01/15 1452       6 Minute Walk   Phase Initial     Distance 1300 feet     Walk Time 6 minutes  RPE 11     Symptoms No     Resting HR 87 bpm     Resting BP 128/84 mmHg     Max Ex. HR 106 bpm     Max Ex. BP 142/74 mmHg        Initial Exercise Prescription:     Initial Exercise Prescription - 07/01/15 1400    Date of Initial Exercise Prescription   Date 07/01/15   Treadmill   MPH 2.5   Grade 0   Minutes 10   Recumbant Bike   Level 2   RPM 40   Watts 20   Minutes 10   NuStep   Level 2   Watts 40   Minutes 10   Arm Ergometer   Level 1   Watts 10   Minutes 10   Recumbant Elliptical   Level 2   RPM 40   Watts 20   Minutes 10   Elliptical   Level 1   Speed 3   Minutes 1   REL-XR   Level 2   Watts 50   Minutes 10   T5 Nustep   Level 1   Watts 15   Minutes 10   Biostep-RELP   Level 2   Watts 40   Minutes 10   Prescription Details   Frequency  (times per week) 3   Duration Progress to 30 minutes of continuous aerobic without signs/symptoms of physical distress   Intensity   THRR REST +  30   Ratings of Perceived Exertion 11-15   Perceived Dyspnea 2-4   Progression Continue progressive overload as per policy without signs/symptoms or physical distress.   Resistance Training   Training Prescription Yes   Weight 2   Reps 10-15      Exercise Prescription Changes:     Exercise Prescription Changes      07/19/15 1500 07/21/15 0900         Exercise Review   Progression Yes Yes      Response to Exercise   Blood Pressure (Admit) 128/88 mmHg       Blood Pressure (Exercise) 130/86 mmHg       Blood Pressure (Exit) 110/62 mmHg       Heart Rate (Admit) 96 bpm       Heart Rate (Exercise) 87 bpm       Heart Rate (Exit) 89 bpm       Rating of Perceived Exertion (Exercise) 13       Symptoms None None      Comments Climmie had a small set back with her blood pressure and heart rate and was out for a few sessions. She hopes to have more consistent attendance from now on. Despite only having 3 exercise sessions she is already feeling more comfortable on the equipment and has made some increases to her workloads. Added the TM to her routine and did very well!      Duration Progress to 30 minutes of continuous aerobic without signs/symptoms of physical distress Progress to 30 minutes of continuous aerobic without signs/symptoms of physical distress      Intensity Rest + 30 Rest + 30      Progression Continue progressive overload as per policy without signs/symptoms or physical distress. Continue progressive overload as per policy without signs/symptoms or physical distress.      Resistance Training   Training Prescription Yes Yes      Weight 2 2      Reps 10-15 10-15  Interval Training   Interval Training No No      Treadmill   MPH  1.5      Grade  0      Minutes  10      NuStep   Level 3 3      Watts 78 45      Minutes 20 20       REL-XR   Level 3 3      Watts 50 50      Minutes 20 20         Discharge Exercise Prescription (Final Exercise Prescription Changes):     Exercise Prescription Changes - 07/21/15 0900    Exercise Review   Progression Yes   Response to Exercise   Symptoms None   Comments Added the TM to her routine and did very well!   Duration Progress to 30 minutes of continuous aerobic without signs/symptoms of physical distress   Intensity Rest + 30   Progression Continue progressive overload as per policy without signs/symptoms or physical distress.   Resistance Training   Training Prescription Yes   Weight 2   Reps 10-15   Interval Training   Interval Training No   Treadmill   MPH 1.5   Grade 0   Minutes 10   NuStep   Level 3   Watts 45   Minutes 20   REL-XR   Level 3   Watts 50   Minutes 20      Nutrition:  Target Goals: Understanding of nutrition guidelines, daily intake of sodium '1500mg'$ , cholesterol '200mg'$ , calories 30% from fat and 7% or less from saturated fats, daily to have 5 or more servings of fruits and vegetables.  Biometrics:     Pre Biometrics - 07/01/15 1455    Pre Biometrics   Height 5' 5.5" (1.664 m)   Weight 143 lb (64.864 kg)   Waist Circumference 37 inches   Hip Circumference 42.5 inches   Waist to Hip Ratio 0.87 %   BMI (Calculated) 23.5       Nutrition Therapy Plan and Nutrition Goals:     Nutrition Therapy & Goals - 07/01/15 1535    Nutrition Therapy   Drug/Food Interactions Statins/Certain Fruits   Intervention Plan   Intervention Using nutrition plan and personal goals to gain a healthy nutrition lifestyle. Add exercise as prescribed.      Nutrition Discharge: Rate Your Plate Scores:   Nutrition Goals Re-Evaluation:   Psychosocial: Target Goals: Acknowledge presence or absence of depression, maximize coping skills, provide positive support system. Participant is able to verbalize types and ability to use techniques and skills  needed for reducing stress and depression.  Initial Review & Psychosocial Screening:     Initial Psych Review & Screening - 07/01/15 [place]? Yes   Comments Brenisha said she is busy all the time but expect for walking she has not done any formal exercise and is anxious what to expect. Janeliz is very active volunteering at her church and meals on wheels etc. Very supportive husband. Very   Screening Interventions   Interventions Encouraged to exercise      Quality of Life Scores:     Quality of Life - 07/01/15 1537    Quality of Life Scores   Health/Function Pre 14.89 %   Socioeconomic Pre 25.8 %   Psych/Spiritual Pre 24.64 %   Family Pre 27.6 %   GLOBAL Pre 20.9 %  PHQ-9:     Recent Review Flowsheet Data    Depression screen [facility] 2/9 07/01/2015   Decreased Interest 1   Down, Depressed, Hopeless 1   PHQ - 2 Score 2   Altered sleeping 1   Tired, decreased energy 2   Change in appetite 2   Feeling bad or failure about yourself  1   Trouble concentrating 1   Moving slowly or fidgety/restless 0   Suicidal thoughts 0   PHQ-9 Score 9      Psychosocial Evaluation and Intervention:     Psychosocial Evaluation - 07/19/15 0948    Psychosocial Evaluation & Interventions   Interventions Encouraged to exercise with the program and follow exercise prescription   Comments Counselor met with Ms. Wiberg today for initial psychosocial evaluation.  She is a 75 year old well-adjusted individual who had a heart attack on 1/6.   She has a strong support system with a spouse of almost 41 years and several adult sons who are very supportive, as well as active involvement in her local church.  Ms. Arp reports she is sleeping "okay" since her heart attack and her appetite has decreased as well.  She denies a history of depression or anxiety or any current symptoms.  She states that her mood is typically positive and she stays very active in the  community with volunteerism.  Her current stressors are her health and that of her spouse as well.  Ms. Ries has goals to increase her stamina and strength while in this program and continue to lose some weight as she learns more about healthy eating.  She plans to join the [employer] class following this program as well as walk to more consistently exercise.     Continued Psychosocial Services Needed Yes  Ms. Morad will benefit from meeting with the dietician to address her weight loss goals.  She will also benefit from consistent exercise.       Psychosocial Re-Evaluation:   Vocational Rehabilitation: Provide vocational rehab assistance to qualifying candidates.   Vocational Rehab Evaluation & Intervention:     Vocational Rehab - 07/01/15 1533    Initial Vocational Rehab Evaluation & Intervention   Assessment shows need for Vocational Rehabilitation No      Education: Education Goals: Education classes will be provided on a weekly basis, covering required topics. Participant will state understanding/return demonstration of topics presented.  Learning Barriers/Preferences:     Learning Barriers/Preferences - 07/01/15 1532    Learning Barriers/Preferences   Learning Barriers None   Learning Preferences Video;Written Material      Education Topics: General Nutrition Guidelines/Fats and Fiber: -Group instruction provided by verbal, written material, models and posters to present the general guidelines for heart healthy nutrition. Gives an explanation and review of dietary fats and fiber.   Controlling Sodium/Reading Food Labels: -Group verbal and written material supporting the discussion of sodium use in heart healthy nutrition. Review and explanation with models, verbal and written materials for utilization of the food label.          Cardiac Rehab from 07/26/2015 in [facility] Cardiac and Pulmonary Rehab   Date  07/19/15   Educator  CR   Instruction Review Code  2-  meets goals/outcomes      Exercise Physiology & Risk Factors: - Group verbal and written instruction with models to review the exercise physiology of the cardiovascular system and associated critical values. Details cardiovascular disease risk factors and the goals associated with each risk factor.  Aerobic Exercise & Resistance Training: - Gives group verbal and written discussion on the health impact of inactivity. On the components of aerobic and resistive training programs and the benefits of this training and how to safely progress through these programs.   Flexibility, Balance, General Exercise Guidelines: - Provides group verbal and written instruction on the benefits of flexibility and balance training programs. Provides general exercise guidelines with specific guidelines to those with heart or lung disease. Demonstration and skill practice provided.   Stress Management: - Provides group verbal and written instruction about the health risks of elevated stress, cause of high stress, and healthy ways to reduce stress.   Depression: - Provides group verbal and written instruction on the correlation between heart/lung disease and depressed mood, treatment options, and the stigmas associated with seeking treatment.   Anatomy & Physiology of the Heart: - Group verbal and written instruction and models provide basic cardiac anatomy and physiology, with the coronary electrical and arterial systems. Review of: AMI, Angina, Valve disease, Heart Failure, Cardiac Arrhythmia, Pacemakers, and the ICD.   Cardiac Procedures: - Group verbal and written instruction and models to describe the testing methods done to diagnose heart disease. Reviews the outcomes of the test results. Describes the treatment choices: Medical Management, Angioplasty, or Coronary Bypass Surgery.      Cardiac Rehab from 07/26/2015 in [facility] Cardiac and Pulmonary Rehab   Date  07/26/15   Educator  SB   Instruction  Review Code  2- meets goals/outcomes      Cardiac Medications: - Group verbal and written instruction to review commonly prescribed medications for heart disease. Reviews the medication, class of the drug, and side effects. Includes the steps to properly store meds and maintain the prescription regimen.   Go Sex-Intimacy & Heart Disease, Get SMART - Goal Setting: - Group verbal and written instruction through game format to discuss heart disease and the return to sexual intimacy. Provides group verbal and written material to discuss and apply goal setting through the application of the S.M.A.R.T. Method.      Cardiac Rehab from 07/26/2015 in [facility] Cardiac and Pulmonary Rehab   Date  07/26/15   Educator  SB   Instruction Review Code  2- meets goals/outcomes      Other Matters of the Heart: - Provides group verbal, written materials and models to describe Heart Failure, Angina, Valve Disease, and Diabetes in the realm of heart disease. Includes description of the disease process and treatment options available to the cardiac patient.   Exercise & Equipment Safety: - Individual verbal instruction and demonstration of equipment use and safety with use of the equipment.      Cardiac Rehab from 07/26/2015 in [facility] Cardiac and Pulmonary Rehab   Date  07/01/15   Educator  C. EnterkinRN   Instruction Review Code  1- partially meets, needs review/practice      Infection Prevention: - Provides verbal and written material to individual with discussion of infection control including proper hand washing and proper equipment cleaning during exercise session.      Cardiac Rehab from 07/26/2015 in [facility] Cardiac and Pulmonary Rehab   Date  07/01/15   Educator  C. EnterkinRN   Instruction Review Code  2- meets goals/outcomes      Falls Prevention: - Provides verbal and written material to individual with discussion of falls prevention and safety.      Cardiac Rehab from 07/26/2015 in [facility] Cardiac and  Pulmonary Rehab   Date  07/01/15  Educator  C. Enterkin, RN   Instruction Review Code  2- meets goals/outcomes      Diabetes: - Individual verbal and written instruction to review signs/symptoms of diabetes, desired ranges of glucose level fasting, after meals and with exercise. Advice that pre and post exercise glucose checks will be done for 3 sessions at entry of program.    Knowledge Questionnaire Score:     Knowledge Questionnaire Score - 07/01/15 1532    Knowledge Questionnaire Score   Pre Score 24      Personal Goals and Risk Factors at Admission:     Personal Goals and Risk Factors at Admission - 07/01/15 1535    Personal Goals and Risk Factors on Admission   Increase Aerobic Exercise and Physical Activity Yes   Intervention While in program, learn and follow the exercise prescription taught. Start at a low level workload and increase workload after able to maintain previous level for 30 minutes. Increase time before increasing intensity.   Take Less Medication Yes   Intervention Learn your risk factors and begin the lifestyle modifications for risk factor control during your time in the program.   Understand more about Heart/Pulmonary Disease. Yes   Diabetes No   Hypertension Yes   Goal Participant will see blood pressure controlled within the values of 140/33m/Hg or within value directed by their physician.   Intervention Provide nutrition & aerobic exercise along with prescribed medications to achieve BP 140/90 or less.   Lipids Yes   Goal Cholesterol controlled with medications as prescribed, with individualized exercise RX and with personalized nutrition plan. Value goals: LDL < 723m HDL > 4064mParticipant states understanding of desired cholesterol values and following prescriptions.   Intervention Provide nutrition & aerobic exercise along with prescribed medications to achieve LDL <17m44mDL >40mg16m   Personal Goals and Risk Factors Review:      Goals  and Risk Factor Review      07/19/15 1533           Increase Aerobic Exercise and Physical Activity   Goals Progress/Improvement seen  Yes       Comments Anderia cMaekaylaexercise continuously for 30 minutes and in the next few weeks we hope to max that out at 45-50 minutes. She has increased her workloads on the NS and XR machine and is gaining strength and stamina. She is more comfortable on the machines and overall is very happy with the program and says she can see the exercise working. With more consistent attendance we hope to see further increases for Andraya iAviahe coming weeks and a translation of how this is improving her ADLs.           Personal Goals Discharge (Final Personal Goals and Risk Factors Review):      Goals and Risk Factor Review - 07/19/15 1533    Increase Aerobic Exercise and Physical Activity   Goals Progress/Improvement seen  Yes   Comments Vela cTaceyexercise continuously for 30 minutes and in the next few weeks we hope to max that out at 45-50 minutes. She has increased her workloads on the NS and XR machine and is gaining strength and stamina. She is more comfortable on the machines and overall is very happy with the program and says she can see the exercise working. With more consistent attendance we hope to see further increases for Ziza iVikihe coming weeks and a translation of how this is improving her ADLs.  ITP Comments:     ITP Comments      07/12/15 1038 07/12/15 1041 07/12/15 1044 07/26/15 0822 07/27/15 0833   ITP Comments Eller came to class todat stating she was not her normal self, She woke up last night not feeling right but no specfic concern. On arrival today she was noted to be in bigeminy rhythm. A new rhythm  to [place].  CAlled Dr Terrence Dupont and spoke to him aabout this rhythm, he spoke to Vesper and advised her to increase her metoprolol to 3 times a day. He also will see her tomorrow at 2 pm in his office.  She was advised by Dr Terrence Dupont and myself to call 911 if  she had increasing symptoms or concerns before her appointment. She was told to have her husband drive her to the appointment.  This note is for 07/12/2015 arrival to the program. Harmon Pier wsa sent home to rest and relax until her appointment on 2/7 See ITP note placed in the 2/3 encounter for concerns on arrival today. No exercise today, education only, had to leave early due to appointment.  30 day review. Continue with ITP      Comments:

## 2015-07-27 NOTE — Progress Notes (Signed)
Cardiac Individual Treatment Plan  Patient Details  Name: Mandy Gilbert MRN: [id number] Date of Birth: 1940/10/22 Referring Provider:  Charolette Forward, MD  Initial Encounter Date:    Visit Diagnosis: S/P PTCA (percutaneous transluminal coronary angioplasty)  Patient's Home Medications on Admission:  Current outpatient prescriptions:  .  aspirin EC 81 MG EC tablet, Take 1 tablet (81 mg total) by mouth daily., Disp: 30 tablet, Rfl: 3 .  atorvastatin (LIPITOR) 20 MG tablet, , Disp: , Rfl:  .  atorvastatin (LIPITOR) 80 MG tablet, Take 1 tablet (80 mg total) by mouth daily at 6 PM., Disp: 30 tablet, Rfl: 3 .  Cholecalciferol (VITAMIN D PO), Take 1,000 Units by mouth every morning. , Disp: , Rfl:  .  DILT-XR 240 MG 24 hr capsule, , Disp: , Rfl:  .  indapamide (LOZOL) 1.25 MG tablet, , Disp: , Rfl:  .  metoprolol tartrate (LOPRESSOR) 25 MG tablet, Take 1 tablet (25 mg total) by mouth 2 (two) times daily. (Patient taking differently: Take 25 mg by mouth 3 (three) times daily. ), Disp: 60 tablet, Rfl: 3 .  nitroGLYCERIN (NITROSTAT) 0.4 MG SL tablet, Place 1 tablet (0.4 mg total) under the tongue every 5 (five) minutes x 3 doses as needed for chest pain., Disp: 25 tablet, Rfl: 12 .  propranolol (INDERAL) 20 MG tablet, , Disp: , Rfl:  .  Psyllium (METAMUCIL PO), Take 3 capsules by mouth every morning., Disp: , Rfl:  .  ticagrelor (BRILINTA) 90 MG TABS tablet, Take 1 tablet (90 mg total) by mouth every 12 (twelve) hours., Disp: 60 tablet, Rfl: 11  Past Medical History: Past Medical History  Diagnosis Date  . Hypertension   . Constipation   . Cancer (HCC)     Skin cancer legs, basal cell.    Tobacco Use: History  Smoking status  . Never Smoker   Smokeless tobacco  . Not on file    Labs: Recent Review Flowsheet Data    Labs for ITP Cardiac and Pulmonary Rehab Latest Ref Rng 06/11/2015 06/11/2015 06/11/2015 06/11/2015 06/12/2015   Cholestrol 0 - 200 mg/dL - 156 - - 173   LDLCALC 0 - 99 mg/dL -  82 - - 90   HDL >40 mg/dL - 63 - - 74   Trlycerides <150 mg/dL - 56 - - 44   Hemoglobin A1c 4.8 - 5.6 % - - 5.8(H) 5.8(H) -   TCO2 0 - 100 mmol/L 14 - - - -       Exercise Target Goals:    Exercise Program Goal: Individual exercise prescription set with THRR, safety & activity barriers. Participant demonstrates ability to understand and report RPE using BORG scale, to self-measure pulse accurately, and to acknowledge the importance of the exercise prescription.  Exercise Prescription Goal: Starting with aerobic activity 30 plus minutes a day, 3 days per week for initial exercise prescription. Provide home exercise prescription and guidelines that participant acknowledges understanding prior to discharge.  Activity Barriers & Risk Stratification:     Activity Barriers & Cardiac Risk Stratification - 07/01/15 1531    Activity Barriers & Cardiac Risk Stratification   Activity Barriers Arthritis;Back Problems   Cardiac Risk Stratification Moderate      6 Minute Walk:     6 Minute Walk      07/01/15 1452       6 Minute Walk   Phase Initial     Distance 1300 feet     Walk Time 6 minutes  RPE 11     Symptoms No     Resting HR 87 bpm     Resting BP 128/84 mmHg     Max Ex. HR 106 bpm     Max Ex. BP 142/74 mmHg        Initial Exercise Prescription:     Initial Exercise Prescription - 07/01/15 1400    Date of Initial Exercise Prescription   Date 07/01/15   Treadmill   MPH 2.5   Grade 0   Minutes 10   Recumbant Bike   Level 2   RPM 40   Watts 20   Minutes 10   NuStep   Level 2   Watts 40   Minutes 10   Arm Ergometer   Level 1   Watts 10   Minutes 10   Recumbant Elliptical   Level 2   RPM 40   Watts 20   Minutes 10   Elliptical   Level 1   Speed 3   Minutes 1   REL-XR   Level 2   Watts 50   Minutes 10   T5 Nustep   Level 1   Watts 15   Minutes 10   Biostep-RELP   Level 2   Watts 40   Minutes 10   Prescription Details   Frequency  (times per week) 3   Duration Progress to 30 minutes of continuous aerobic without signs/symptoms of physical distress   Intensity   THRR REST +  30   Ratings of Perceived Exertion 11-15   Perceived Dyspnea 2-4   Progression Continue progressive overload as per policy without signs/symptoms or physical distress.   Resistance Training   Training Prescription Yes   Weight 2   Reps 10-15      Exercise Prescription Changes:     Exercise Prescription Changes      07/19/15 1500 07/21/15 0900         Exercise Review   Progression Yes Yes      Response to Exercise   Blood Pressure (Admit) 128/88 mmHg       Blood Pressure (Exercise) 130/86 mmHg       Blood Pressure (Exit) 110/62 mmHg       Heart Rate (Admit) 96 bpm       Heart Rate (Exercise) 87 bpm       Heart Rate (Exit) 89 bpm       Rating of Perceived Exertion (Exercise) 13       Symptoms None None      Comments Mandy Gilbert had a small set back with her blood pressure and heart rate and was out for a few sessions. She hopes to have more consistent attendance from now on. Despite only having 3 exercise sessions she is already feeling more comfortable on the equipment and has made some increases to her workloads. Added the TM to her routine and did very well!      Duration Progress to 30 minutes of continuous aerobic without signs/symptoms of physical distress Progress to 30 minutes of continuous aerobic without signs/symptoms of physical distress      Intensity Rest + 30 Rest + 30      Progression Continue progressive overload as per policy without signs/symptoms or physical distress. Continue progressive overload as per policy without signs/symptoms or physical distress.      Resistance Training   Training Prescription Yes Yes      Weight 2 2      Reps 10-15 10-15  Interval Training   Interval Training No No      Treadmill   MPH  1.5      Grade  0      Minutes  10      NuStep   Level 3 3      Watts 53 45      Minutes 20 20       REL-XR   Level 3 3      Watts 50 50      Minutes 20 20         Discharge Exercise Prescription (Final Exercise Prescription Changes):     Exercise Prescription Changes - 07/21/15 0900    Exercise Review   Progression Yes   Response to Exercise   Symptoms None   Comments Added the TM to her routine and did very well!   Duration Progress to 30 minutes of continuous aerobic without signs/symptoms of physical distress   Intensity Rest + 30   Progression Continue progressive overload as per policy without signs/symptoms or physical distress.   Resistance Training   Training Prescription Yes   Weight 2   Reps 10-15   Interval Training   Interval Training No   Treadmill   MPH 1.5   Grade 0   Minutes 10   NuStep   Level 3   Watts 45   Minutes 20   REL-XR   Level 3   Watts 50   Minutes 20      Nutrition:  Target Goals: Understanding of nutrition guidelines, daily intake of sodium '1500mg'$ , cholesterol '200mg'$ , calories 30% from fat and 7% or less from saturated fats, daily to have 5 or more servings of fruits and vegetables.  Biometrics:     Pre Biometrics - 07/01/15 1455    Pre Biometrics   Height 5' 5.5" (1.664 m)   Weight 143 lb (64.864 kg)   Waist Circumference 37 inches   Hip Circumference 42.5 inches   Waist to Hip Ratio 0.87 %   BMI (Calculated) 23.5       Nutrition Therapy Plan and Nutrition Goals:     Nutrition Therapy & Goals - 07/01/15 1535    Nutrition Therapy   Drug/Food Interactions Statins/Certain Fruits   Intervention Plan   Intervention Using nutrition plan and personal goals to gain a healthy nutrition lifestyle. Add exercise as prescribed.      Nutrition Discharge: Rate Your Plate Scores:   Nutrition Goals Re-Evaluation:   Psychosocial: Target Goals: Acknowledge presence or absence of depression, maximize coping skills, provide positive support system. Participant is able to verbalize types and ability to use techniques and skills  needed for reducing stress and depression.  Initial Review & Psychosocial Screening:     Initial Psych Review & Screening - 07/01/15 [place]? Yes   Comments Mandy Gilbert said she is busy all the time but expect for walking she has not done any formal exercise and is anxious what to expect. Mandy Gilbert is very active volunteering at her church and meals on wheels etc. Very supportive husband. Very   Screening Interventions   Interventions Encouraged to exercise      Quality of Life Scores:     Quality of Life - 07/01/15 1537    Quality of Life Scores   Health/Function Pre 14.89 %   Socioeconomic Pre 25.8 %   Psych/Spiritual Pre 24.64 %   Family Pre 27.6 %   GLOBAL Pre 20.9 %  PHQ-9:     Recent Review Flowsheet Data    Depression screen [facility] 2/9 07/01/2015   Decreased Interest 1   Down, Depressed, Hopeless 1   PHQ - 2 Score 2   Altered sleeping 1   Tired, decreased energy 2   Change in appetite 2   Feeling bad or failure about yourself  1   Trouble concentrating 1   Moving slowly or fidgety/restless 0   Suicidal thoughts 0   PHQ-9 Score 9      Psychosocial Evaluation and Intervention:     Psychosocial Evaluation - 07/19/15 0948    Psychosocial Evaluation & Interventions   Interventions Encouraged to exercise with the program and follow exercise prescription   Comments Counselor met with Mandy Gilbert today for initial psychosocial evaluation.  She is a 75 year old well-adjusted individual who had a heart attack on 1/6.   She has a strong support system with a spouse of almost 41 years and several adult sons who are very supportive, as well as active involvement in her local church.  Mandy Gilbert reports she is sleeping "okay" since her heart attack and her appetite has decreased as well.  She denies a history of depression or anxiety or any current symptoms.  She states that her mood is typically positive and she stays very active in the  community with volunteerism.  Her current stressors are her health and that of her spouse as well.  Mandy Gilbert has goals to increase her stamina and strength while in this program and continue to lose some weight as she learns more about healthy eating.  She plans to join the [employer] class following this program as well as walk to more consistently exercise.     Continued Psychosocial Services Needed Yes  Mandy Gilbert will benefit from meeting with the dietician to address her weight loss goals.  She will also benefit from consistent exercise.       Psychosocial Re-Evaluation:   Vocational Rehabilitation: Provide vocational rehab assistance to qualifying candidates.   Vocational Rehab Evaluation & Intervention:     Vocational Rehab - 07/01/15 1533    Initial Vocational Rehab Evaluation & Intervention   Assessment shows need for Vocational Rehabilitation No      Education: Education Goals: Education classes will be provided on a weekly basis, covering required topics. Participant will state understanding/return demonstration of topics presented.  Learning Barriers/Preferences:     Learning Barriers/Preferences - 07/01/15 1532    Learning Barriers/Preferences   Learning Barriers None   Learning Preferences Video;Written Material      Education Topics: General Nutrition Guidelines/Fats and Fiber: -Group instruction provided by verbal, written material, models and posters to present the general guidelines for heart healthy nutrition. Gives an explanation and review of dietary fats and fiber.   Controlling Sodium/Reading Food Labels: -Group verbal and written material supporting the discussion of sodium use in heart healthy nutrition. Review and explanation with models, verbal and written materials for utilization of the food label.          Cardiac Rehab from 07/26/2015 in [facility] Cardiac and Pulmonary Rehab   Date  07/19/15   Educator  CR   Instruction Review Code  2-  meets goals/outcomes      Exercise Physiology & Risk Factors: - Group verbal and written instruction with models to review the exercise physiology of the cardiovascular system and associated critical values. Details cardiovascular disease risk factors and the goals associated with each risk factor.  Aerobic Exercise & Resistance Training: - Gives group verbal and written discussion on the health impact of inactivity. On the components of aerobic and resistive training programs and the benefits of this training and how to safely progress through these programs.   Flexibility, Balance, General Exercise Guidelines: - Provides group verbal and written instruction on the benefits of flexibility and balance training programs. Provides general exercise guidelines with specific guidelines to those with heart or lung disease. Demonstration and skill practice provided.   Stress Management: - Provides group verbal and written instruction about the health risks of elevated stress, cause of high stress, and healthy ways to reduce stress.   Depression: - Provides group verbal and written instruction on the correlation between heart/lung disease and depressed mood, treatment options, and the stigmas associated with seeking treatment.   Anatomy & Physiology of the Heart: - Group verbal and written instruction and models provide basic cardiac anatomy and physiology, with the coronary electrical and arterial systems. Review of: AMI, Angina, Valve disease, Heart Failure, Cardiac Arrhythmia, Pacemakers, and the ICD.   Cardiac Procedures: - Group verbal and written instruction and models to describe the testing methods done to diagnose heart disease. Reviews the outcomes of the test results. Describes the treatment choices: Medical Management, Angioplasty, or Coronary Bypass Surgery.      Cardiac Rehab from 07/26/2015 in [facility] Cardiac and Pulmonary Rehab   Date  07/26/15   Educator  SB   Instruction  Review Code  2- meets goals/outcomes      Cardiac Medications: - Group verbal and written instruction to review commonly prescribed medications for heart disease. Reviews the medication, class of the drug, and side effects. Includes the steps to properly store meds and maintain the prescription regimen.   Go Sex-Intimacy & Heart Disease, Get SMART - Goal Setting: - Group verbal and written instruction through game format to discuss heart disease and the return to sexual intimacy. Provides group verbal and written material to discuss and apply goal setting through the application of the S.M.A.R.T. Method.      Cardiac Rehab from 07/26/2015 in [facility] Cardiac and Pulmonary Rehab   Date  07/26/15   Educator  SB   Instruction Review Code  2- meets goals/outcomes      Other Matters of the Heart: - Provides group verbal, written materials and models to describe Heart Failure, Angina, Valve Disease, and Diabetes in the realm of heart disease. Includes description of the disease process and treatment options available to the cardiac patient.   Exercise & Equipment Safety: - Individual verbal instruction and demonstration of equipment use and safety with use of the equipment.      Cardiac Rehab from 07/26/2015 in [facility] Cardiac and Pulmonary Rehab   Date  07/01/15   Educator  C. EnterkinRN   Instruction Review Code  1- partially meets, needs review/practice      Infection Prevention: - Provides verbal and written material to individual with discussion of infection control including proper hand washing and proper equipment cleaning during exercise session.      Cardiac Rehab from 07/26/2015 in [facility] Cardiac and Pulmonary Rehab   Date  07/01/15   Educator  C. EnterkinRN   Instruction Review Code  2- meets goals/outcomes      Falls Prevention: - Provides verbal and written material to individual with discussion of falls prevention and safety.      Cardiac Rehab from 07/26/2015 in [facility] Cardiac and  Pulmonary Rehab   Date  07/01/15  Educator  C. Enterkin, RN   Instruction Review Code  2- meets goals/outcomes      Diabetes: - Individual verbal and written instruction to review signs/symptoms of diabetes, desired ranges of glucose level fasting, after meals and with exercise. Advice that pre and post exercise glucose checks will be done for 3 sessions at entry of program.    Knowledge Questionnaire Score:     Knowledge Questionnaire Score - 07/01/15 1532    Knowledge Questionnaire Score   Pre Score 24      Personal Goals and Risk Factors at Admission:     Personal Goals and Risk Factors at Admission - 07/01/15 1535    Personal Goals and Risk Factors on Admission   Increase Aerobic Exercise and Physical Activity Yes   Intervention While in program, learn and follow the exercise prescription taught. Start at a low level workload and increase workload after able to maintain previous level for 30 minutes. Increase time before increasing intensity.   Take Less Medication Yes   Intervention Learn your risk factors and begin the lifestyle modifications for risk factor control during your time in the program.   Understand more about Heart/Pulmonary Disease. Yes   Diabetes No   Hypertension Yes   Goal Participant will see blood pressure controlled within the values of 140/33m/Hg or within value directed by their physician.   Intervention Provide nutrition & aerobic exercise along with prescribed medications to achieve BP 140/90 or less.   Lipids Yes   Goal Cholesterol controlled with medications as prescribed, with individualized exercise RX and with personalized nutrition plan. Value goals: LDL < 723m HDL > 4064mParticipant states understanding of desired cholesterol values and following prescriptions.   Intervention Provide nutrition & aerobic exercise along with prescribed medications to achieve LDL <17m44mDL >40mg16m   Personal Goals and Risk Factors Review:      Goals  and Risk Factor Review      07/19/15 1533           Increase Aerobic Exercise and Physical Activity   Goals Progress/Improvement seen  Yes       Comments Mandy cMaekaylaexercise continuously for 30 minutes and in the next few weeks we hope to max that out at 45-50 minutes. She has increased her workloads on the NS and XR machine and is gaining strength and stamina. She is more comfortable on the machines and overall is very happy with the program and says she can see the exercise working. With more consistent attendance we hope to see further increases for Mandy Gilbert coming weeks and a translation of how this is improving her ADLs.           Personal Goals Discharge (Final Personal Goals and Risk Factors Review):      Goals and Risk Factor Review - 07/19/15 1533    Increase Aerobic Exercise and Physical Activity   Goals Progress/Improvement seen  Yes   Comments Mandy cTaceyexercise continuously for 30 minutes and in the next few weeks we hope to max that out at 45-50 minutes. She has increased her workloads on the NS and XR machine and is gaining strength and stamina. She is more comfortable on the machines and overall is very happy with the program and says she can see the exercise working. With more consistent attendance we hope to see further increases for Mandy Gilbert coming weeks and a translation of how this is improving her ADLs.  ITP Comments:     ITP Comments      07/12/15 1038 07/12/15 1041 07/12/15 1044 07/26/15 0822 07/27/15 0833   ITP Comments Mandy Gilbert came to class todat stating she was not her normal self, She woke up last night not feeling right but no specfic concern. On arrival today she was noted to be in bigeminy rhythm. A new rhythm  to [place].  CAlled Dr Terrence Dupont and spoke to him aabout this rhythm, he spoke to Mandy Gilbert and advised her to increase her metoprolol to 3 times a day. He also will see her tomorrow at 2 pm in his office.  She was advised by Dr Terrence Dupont and myself to call 911 if  she had increasing symptoms or concerns before her appointment. She was told to have her husband drive her to the appointment.  This note is for 07/12/2015 arrival to the program. Mandy Gilbert wsa sent home to rest and relax until her appointment on 2/7 See ITP note placed in the 2/3 encounter for concerns on arrival today. No exercise today, education only, had to leave early due to appointment.  30 day review. Continue with ITP     07/27/15 0834           ITP Comments Has attended a few sessions since orientation          Comments:

## 2015-07-28 DIAGNOSIS — I213 ST elevation (STEMI) myocardial infarction of unspecified site: Secondary | ICD-10-CM

## 2015-07-28 DIAGNOSIS — Z9861 Coronary angioplasty status: Secondary | ICD-10-CM

## 2015-07-28 NOTE — Progress Notes (Signed)
Daily Session Note  Patient Details  Name: MARSHIA TROPEA MRN: [id number] Date of Birth: 08-Sep-1940 Referring Provider:  Charolette Forward, MD  Encounter Date: 07/28/2015  Check In:     Session Check In - 07/28/15 0818    Check-In   Location [facility]-Cardiac & Pulmonary Rehab   Staff Present Heath Lark, RN, BSN, CCRP;Renee Dillard Essex, MS, ACSM CEP, Exercise Physiologist;Davide Risdon, BS, ACSM EP-C, Exercise Physiologist   Supervising physician immediately available to respond to emergencies See telemetry face sheet for immediately available ER MD   Medication changes reported     No   Fall or balance concerns reported    No   Warm-up and Cool-down Performed on first and last piece of equipment   Resistance Training Performed No   VAD Patient? No   Pain Assessment   Currently in Pain? No/denies         Goals Met:  Proper associated with RPD/PD & O2 Sat Exercise tolerated well No report of cardiac concerns or symptoms Strength training completed today  Goals Unmet:  Not Applicable  Goals Comments:    Dr. Emily Filbert is Medical Director for [facility] and [facility].

## 2015-07-30 ENCOUNTER — Encounter: Payer: Medicare Other | Admitting: *Deleted

## 2015-07-30 DIAGNOSIS — I213 ST elevation (STEMI) myocardial infarction of unspecified site: Secondary | ICD-10-CM | POA: Diagnosis not present

## 2015-07-30 DIAGNOSIS — Z9861 Coronary angioplasty status: Secondary | ICD-10-CM

## 2015-07-30 NOTE — Progress Notes (Signed)
Daily Session Note  Patient Details  Name: Mandy Gilbert MRN: [id number] Date of Birth: 06-02-1941 Referring Provider:  Charolette Forward, MD  Encounter Date: 07/30/2015  Check In:     Session Check In - 07/30/15 0925    Check-In   Staff Present Heath Lark, RN, BSN, CCRP;Renee Dillard Essex, MS, ACSM CEP, Exercise Physiologist;Carroll Enterkin, RN, BSN   Supervising physician immediately available to respond to emergencies See telemetry face sheet for immediately available ER MD   Medication changes reported     No   Fall or balance concerns reported    No   Warm-up and Cool-down Performed on first and last piece of equipment   Resistance Training Performed No   VAD Patient? No   Pain Assessment   Currently in Pain? No/denies         Goals Met:  Exercise tolerated well No report of cardiac concerns or symptoms Strength training completed today  Goals Unmet:  Not Applicable  Goals Comments: Doing well with exercise prescription progression.    Dr. Emily Filbert is Medical Director for [place] and [facility].

## 2015-08-02 ENCOUNTER — Encounter: Payer: Medicare Other | Admitting: *Deleted

## 2015-08-02 DIAGNOSIS — Z9861 Coronary angioplasty status: Secondary | ICD-10-CM

## 2015-08-02 DIAGNOSIS — I213 ST elevation (STEMI) myocardial infarction of unspecified site: Secondary | ICD-10-CM | POA: Diagnosis not present

## 2015-08-02 NOTE — Progress Notes (Signed)
Daily Session Note  Patient Details  Name: Mandy Gilbert MRN: [id number] Date of Birth: [birth year] Referring Provider:  Charolette Forward, MD  Encounter Date: 08/02/2015  Check In:     Session Check In - 08/02/15 1021    Check-In   Location [facility]   Staff Present Candiss Norse, MS, ACSM CEP, Exercise Physiologist;Carroll Enterkin, RN, Moises Blood, BS, ACSM CEP, Exercise Physiologist   Supervising physician immediately available to respond to emergencies See telemetry face sheet for immediately available ER MD   Medication changes reported     No   Fall or balance concerns reported    No   Warm-up and Cool-down Performed on first and last piece of equipment   Resistance Training Performed Yes   VAD Patient? No   Pain Assessment   Currently in Pain? No/denies   Multiple Pain Sites No           Exercise Prescription Changes - 08/02/15 1000    Exercise Review   Progression Yes   Response to Exercise   Symptoms None   Comments Reviewed individualized exercise prescription and made increases per departmental policy. Exercise increases were discussed with the patient and they were able to perform the new work loads without issue (no signs or symptoms).    Duration Progress to 30 minutes of continuous aerobic without signs/symptoms of physical distress   Intensity Rest + 30   Progression Continue progressive overload as per policy without signs/symptoms or physical distress.   Resistance Training   Training Prescription Yes   Weight 3   Reps 10-15   Interval Training   Interval Training No   Treadmill   MPH 1.7   Grade 0   Minutes 15   NuStep   Level 4   Watts 50   Minutes 20   REL-XR   Level 3   Watts 50   Minutes 20      Goals Met:  Independence with exercise equipment Exercise tolerated well Personal goals reviewed No report of cardiac concerns or symptoms Strength training completed today  Goals Unmet:  Not Applicable  Goals  Comments: Patient completed exercise prescription and all exercise goals during rehab session. The exercise was tolerated well and the patient is progressing in the program.    Dr. Emily Filbert is Medical Director for [place] and [facility].

## 2015-08-04 ENCOUNTER — Encounter: Payer: Medicare Other | Attending: Cardiology | Admitting: *Deleted

## 2015-08-04 DIAGNOSIS — I213 ST elevation (STEMI) myocardial infarction of unspecified site: Secondary | ICD-10-CM | POA: Insufficient documentation

## 2015-08-04 DIAGNOSIS — Z9861 Coronary angioplasty status: Secondary | ICD-10-CM | POA: Insufficient documentation

## 2015-08-04 NOTE — Progress Notes (Signed)
Daily Session Note  Patient Details  Name: Mandy Gilbert MRN: [id number] Date of Birth: 07/07/1940 Referring Provider:  Harwani, Mohan, MD  Encounter Date: 08/04/2015  Check In:     Session Check In - 08/04/15 0853    Check-In   Staff Present Mary Jo Abernethy, RN;Susanne Bice, RN, BSN, CCRP;Steven Way, BS, ACSM EP-C, Exercise Physiologist   Supervising physician immediately available to respond to emergencies See telemetry face sheet for immediately available ER MD   Medication changes reported     No   Fall or balance concerns reported    No   Warm-up and Cool-down Performed on first and last piece of equipment   Resistance Training Performed No   VAD Patient? No   Pain Assessment   Currently in Pain? No/denies         Goals Met:  Independence with exercise equipment Exercise tolerated well No report of cardiac concerns or symptoms  Goals Unmet:  Not Applicable  Comments: .Doing well with exercise prescription progression.    Dr. Mark Miller is Medical Director for [facility] and [facility]. 

## 2015-08-04 NOTE — Addendum Note (Signed)
Addended by: Lynford Humphrey on: 08/04/2015 07:24 AM   Modules accepted: Orders

## 2015-08-04 NOTE — Progress Notes (Signed)
Cardiac Individual Treatment Plan  Patient Details  Name: Mandy Gilbert MRN: [id number] Date of Birth: 1940/10/22 Referring Provider:  Charolette Forward, MD  Initial Encounter Date:    Visit Diagnosis: S/P PTCA (percutaneous transluminal coronary angioplasty)  Patient's Home Medications on Admission:  Current outpatient prescriptions:  .  aspirin EC 81 MG EC tablet, Take 1 tablet (81 mg total) by mouth daily., Disp: 30 tablet, Rfl: 3 .  atorvastatin (LIPITOR) 20 MG tablet, , Disp: , Rfl:  .  atorvastatin (LIPITOR) 80 MG tablet, Take 1 tablet (80 mg total) by mouth daily at 6 PM., Disp: 30 tablet, Rfl: 3 .  Cholecalciferol (VITAMIN D PO), Take 1,000 Units by mouth every morning. , Disp: , Rfl:  .  DILT-XR 240 MG 24 hr capsule, , Disp: , Rfl:  .  indapamide (LOZOL) 1.25 MG tablet, , Disp: , Rfl:  .  metoprolol tartrate (LOPRESSOR) 25 MG tablet, Take 1 tablet (25 mg total) by mouth 2 (two) times daily. (Patient taking differently: Take 25 mg by mouth 3 (three) times daily. ), Disp: 60 tablet, Rfl: 3 .  nitroGLYCERIN (NITROSTAT) 0.4 MG SL tablet, Place 1 tablet (0.4 mg total) under the tongue every 5 (five) minutes x 3 doses as needed for chest pain., Disp: 25 tablet, Rfl: 12 .  propranolol (INDERAL) 20 MG tablet, , Disp: , Rfl:  .  Psyllium (METAMUCIL PO), Take 3 capsules by mouth every morning., Disp: , Rfl:  .  ticagrelor (BRILINTA) 90 MG TABS tablet, Take 1 tablet (90 mg total) by mouth every 12 (twelve) hours., Disp: 60 tablet, Rfl: 11  Past Medical History: Past Medical History  Diagnosis Date  . Hypertension   . Constipation   . Cancer (HCC)     Skin cancer legs, basal cell.    Tobacco Use: History  Smoking status  . Never Smoker   Smokeless tobacco  . Not on file    Labs: Recent Review Flowsheet Data    Labs for ITP Cardiac and Pulmonary Rehab Latest Ref Rng 06/11/2015 06/11/2015 06/11/2015 06/11/2015 06/12/2015   Cholestrol 0 - 200 mg/dL - 156 - - 173   LDLCALC 0 - 99 mg/dL -  82 - - 90   HDL >40 mg/dL - 63 - - 74   Trlycerides <150 mg/dL - 56 - - 44   Hemoglobin A1c 4.8 - 5.6 % - - 5.8(H) 5.8(H) -   TCO2 0 - 100 mmol/L 14 - - - -       Exercise Target Goals:    Exercise Program Goal: Individual exercise prescription set with THRR, safety & activity barriers. Participant demonstrates ability to understand and report RPE using BORG scale, to self-measure pulse accurately, and to acknowledge the importance of the exercise prescription.  Exercise Prescription Goal: Starting with aerobic activity 30 plus minutes a day, 3 days per week for initial exercise prescription. Provide home exercise prescription and guidelines that participant acknowledges understanding prior to discharge.  Activity Barriers & Risk Stratification:     Activity Barriers & Cardiac Risk Stratification - 07/01/15 1531    Activity Barriers & Cardiac Risk Stratification   Activity Barriers Arthritis;Back Problems   Cardiac Risk Stratification Moderate      6 Minute Walk:     6 Minute Walk      07/01/15 1452       6 Minute Walk   Phase Initial     Distance 1300 feet     Walk Time 6 minutes  RPE 11     Symptoms No     Resting HR 87 bpm     Resting BP 128/84 mmHg     Max Ex. HR 106 bpm     Max Ex. BP 142/74 mmHg        Initial Exercise Prescription:     Initial Exercise Prescription - 07/01/15 1400    Date of Initial Exercise Prescription   Date 07/01/15   Treadmill   MPH 2.5   Grade 0   Minutes 10   Recumbant Bike   Level 2   RPM 40   Watts 20   Minutes 10   NuStep   Level 2   Watts 40   Minutes 10   Arm Ergometer   Level 1   Watts 10   Minutes 10   Recumbant Elliptical   Level 2   RPM 40   Watts 20   Minutes 10   Elliptical   Level 1   Speed 3   Minutes 1   REL-XR   Level 2   Watts 50   Minutes 10   T5 Nustep   Level 1   Watts 15   Minutes 10   Biostep-RELP   Level 2   Watts 40   Minutes 10   Prescription Details   Frequency  (times per week) 3   Duration Progress to 30 minutes of continuous aerobic without signs/symptoms of physical distress   Intensity   THRR REST +  30   Ratings of Perceived Exertion 11-15   Perceived Dyspnea 2-4   Progression   Progression Continue progressive overload as per policy without signs/symptoms or physical distress.   Resistance Training   Training Prescription Yes   Weight 2   Reps 10-15      Exercise Prescription Changes:     Exercise Prescription Changes      07/19/15 1500 07/21/15 0900 07/30/15 0900 08/02/15 1000     Exercise Review   Progression Yes Yes Yes Yes    Response to Exercise   Blood Pressure (Admit) 128/88 mmHg       Blood Pressure (Exercise) 130/86 mmHg       Blood Pressure (Exit) 110/62 mmHg       Heart Rate (Admit) 96 bpm       Heart Rate (Exercise) 87 bpm       Heart Rate (Exit) 89 bpm       Rating of Perceived Exertion (Exercise) 13       Symptoms None None None None    Comments Mandy Gilbert had a small set back with her blood pressure and heart rate and was out for a few sessions. She hopes to have more consistent attendance from now on. Despite only having 3 exercise sessions she is already feeling more comfortable on the equipment and has made some increases to her workloads. Added the TM to her routine and did very well! Added the TM to her routine and did very well! Reviewed individualized exercise prescription and made increases per departmental policy. Exercise increases were discussed with the patient and they were able to perform the new work loads without issue (no signs or symptoms).     Duration Progress to 30 minutes of continuous aerobic without signs/symptoms of physical distress Progress to 30 minutes of continuous aerobic without signs/symptoms of physical distress Progress to 30 minutes of continuous aerobic without signs/symptoms of physical distress Progress to 30 minutes of continuous aerobic without signs/symptoms of physical distress  Intensity Rest + 30 Rest + 30 Rest + 30 Rest + 30    Progression   Progression Continue progressive overload as per policy without signs/symptoms or physical distress. Continue progressive overload as per policy without signs/symptoms or physical distress. Continue progressive overload as per policy without signs/symptoms or physical distress. Continue progressive overload as per policy without signs/symptoms or physical distress.    Resistance Training   Training Prescription (read-only) Yes Yes Yes Yes    Weight (read-only) _0 Reps (read-only) 10-15 10-15 10-15 10-15    Interval Training   Interval Training No No No No    Treadmill   MPH (read-only)  1.5 1.5 1.7    Grade (read-only)  0 0 0    Minutes (read-only)  _1 NuStep   Level (read-only) _2 Watts (read-only) 85 45 11 50    Minutes (read-only) _3 REL-XR   Level (read-only) _4 Watts (read-only) 50 50 50 50    Minutes (read-only) _5 Discharge Exercise Prescription (Final Exercise Prescription Changes):     Exercise Prescription Changes - 08/02/15 1000    Exercise Review   Progression Yes   Response to Exercise   Symptoms None   Comments Reviewed individualized exercise prescription and made increases per departmental policy. Exercise increases were discussed with the patient and they were able to perform the new work loads without issue (no signs or symptoms).    Duration Progress to 30 minutes of continuous aerobic without signs/symptoms of physical distress   Intensity Rest + 30   Progression   Progression Continue progressive overload as per policy without signs/symptoms or physical distress.   Resistance Training   Training Prescription (read-only) Yes   Weight (read-only) 3   Reps (read-only) 10-15   Interval Training   Interval Training No   Treadmill   MPH (read-only) 1.7   Grade (read-only) 0   Minutes (read-only) 15   NuStep   Level (read-only) 4    Watts (read-only) 50   Minutes (read-only) 20   REL-XR   Level (read-only) 3   Watts (read-only) 50   Minutes (read-only) 20      Nutrition:  Target Goals: Understanding of nutrition guidelines, daily intake of sodium <1553m, cholesterol <2071m calories 30% from fat and 7% or less from saturated fats, daily to have 5 or more servings of fruits and vegetables.  Biometrics:     Pre Biometrics - 07/01/15 1455    Pre Biometrics   Height 5' 5.5" (1.664 m)   Weight 143 lb (64.864 kg)   Waist Circumference 37 inches   Hip Circumference 42.5 inches   Waist to Hip Ratio 0.87 %   BMI (Calculated) 23.5       Nutrition Therapy Plan and Nutrition Goals:     Nutrition Therapy & Goals - 07/01/15 1535    Nutrition Therapy   Drug/Food Interactions Statins/Certain Fruits   Intervention Plan   Intervention (read-only) Using nutrition plan and personal goals to gain a healthy nutrition lifestyle. Add exercise as prescribed.      Nutrition Discharge: Rate Your Plate Scores:   Nutrition Goals Re-Evaluation:   Psychosocial: Target Goals: Acknowledge presence or absence of depression, maximize coping skills, provide positive support system. Participant is able to verbalize types and ability to use techniques and  skills needed for reducing stress and depression.  Initial Review & Psychosocial Screening:     Initial Psych Review & Screening - 07/01/15 [place]? Yes   Comments Mandy Gilbert said she is busy all the time but expect for walking she has not done any formal exercise and is anxious what to expect. Mandy Gilbert is very active volunteering at her church and meals [employer] wheels etc. Very supportive husband. Very   Screening Interventions   Interventions Encouraged to exercise      Quality of Life Scores:     Quality of Life - 07/01/15 1537    Quality of Life Scores   Health/Function Pre 14.89 %   Socioeconomic Pre 25.8 %   Psych/Spiritual Pre 24.64 %    Family Pre 27.6 %   GLOBAL Pre 20.9 %      PHQ-9:     Recent Review Flowsheet Data    Depression screen [facility] 2/9 07/01/2015   Decreased Interest 1   Down, Depressed, Hopeless 1   PHQ - 2 Score 2   Altered sleeping 1   Tired, decreased energy 2   Change in appetite 2   Feeling bad or failure about yourself  1   Trouble concentrating 1   Moving slowly or fidgety/restless 0   Suicidal thoughts 0   PHQ-9 Score 9      Psychosocial Evaluation and Intervention:     Psychosocial Evaluation - 07/19/15 0948    Psychosocial Evaluation & Interventions   Interventions Encouraged to exercise with the program and follow exercise prescription   Comments Counselor met with Mandy Gilbert today for initial psychosocial evaluation.  She is a 75 year old well-adjusted individual who had a heart attack on 1/6.   She has a strong support system with a spouse of almost 62 years and several adult sons who are very supportive, as well as active involvement in her local church.  Mandy Gilbert reports she is sleeping "okay" since her heart attack and her appetite has decreased as well.  She denies a history of depression or anxiety or any current symptoms.  She states that her mood is typically positive and she stays very active in the community with volunteerism.  Her current stressors are her health and that of her spouse as well.  Mandy Gilbert has goals to increase her stamina and strength while in this program and continue to lose some weight as she learns more about healthy eating.  She plans to join the [employer] class following this program as well as walk to more consistently exercise.     Continued Psychosocial Services Needed Yes  Mandy Gilbert will benefit from meeting with the dietician to address her weight loss goals.  She will also benefit from consistent exercise.       Psychosocial Re-Evaluation:   Vocational Rehabilitation: Provide vocational rehab assistance to qualifying  candidates.   Vocational Rehab Evaluation & Intervention:     Vocational Rehab - 07/01/15 1533    Initial Vocational Rehab Evaluation & Intervention   Assessment shows need for Vocational Rehabilitation No      Education: Education Goals: Education classes will be provided on a weekly basis, covering required topics. Participant will state understanding/return demonstration of topics presented.  Learning Barriers/Preferences:     Learning Barriers/Preferences - 07/01/15 1532    Learning Barriers/Preferences   Learning Barriers None   Learning Preferences Video;Written Material      Education Topics: General  Nutrition Guidelines/Fats and Fiber: -Group instruction provided by verbal, written material, models and posters to present the general guidelines for heart healthy nutrition. Gives an explanation and review of dietary fats and fiber.   Controlling Sodium/Reading Food Labels: -Group verbal and written material supporting the discussion of sodium use in heart healthy nutrition. Review and explanation with models, verbal and written materials for utilization of the food label.          Cardiac Rehab from 08/02/2015 in [facility] Cardiac and Pulmonary Rehab   Date  07/19/15   Educator  CR   Instruction Review Code  2- meets goals/outcomes      Exercise Physiology & Risk Factors: - Group verbal and written instruction with models to review the exercise physiology of the cardiovascular system and associated critical values. Details cardiovascular disease risk factors and the goals associated with each risk factor.      Cardiac Rehab from 08/02/2015 in [facility] Cardiac and Pulmonary Rehab   Date  08/02/15   Educator  RM   Instruction Review Code  2- meets goals/outcomes      Aerobic Exercise & Resistance Training: - Gives group verbal and written discussion on the health impact of inactivity. On the components of aerobic and resistive training programs and the benefits of this  training and how to safely progress through these programs.   Flexibility, Balance, General Exercise Guidelines: - Provides group verbal and written instruction on the benefits of flexibility and balance training programs. Provides general exercise guidelines with specific guidelines to those with heart or lung disease. Demonstration and skill practice provided.   Stress Management: - Provides group verbal and written instruction about the health risks of elevated stress, cause of high stress, and healthy ways to reduce stress.   Depression: - Provides group verbal and written instruction on the correlation between heart/lung disease and depressed mood, treatment options, and the stigmas associated with seeking treatment.      Cardiac Rehab from 08/02/2015 in [facility] Cardiac and Pulmonary Rehab   Date  07/28/15   Educator  [facility]   Instruction Review Code  2- meets goals/outcomes      Anatomy & Physiology of the Heart: - Group verbal and written instruction and models provide basic cardiac anatomy and physiology, with the coronary electrical and arterial systems. Review of: AMI, Angina, Valve disease, Heart Failure, Cardiac Arrhythmia, Pacemakers, and the ICD.   Cardiac Procedures: - Group verbal and written instruction and models to describe the testing methods done to diagnose heart disease. Reviews the outcomes of the test results. Describes the treatment choices: Medical Management, Angioplasty, or Coronary Bypass Surgery.      Cardiac Rehab from 08/02/2015 in [facility] Cardiac and Pulmonary Rehab   Date  07/26/15   Educator  SB   Instruction Review Code  2- meets goals/outcomes      Cardiac Medications: - Group verbal and written instruction to review commonly prescribed medications for heart disease. Reviews the medication, class of the drug, and side effects. Includes the steps to properly store meds and maintain the prescription regimen.   Go Sex-Intimacy & Heart Disease, Get SMART -  Goal Setting: - Group verbal and written instruction through game format to discuss heart disease and the return to sexual intimacy. Provides group verbal and written material to discuss and apply goal setting through the application of the S.M.A.R.T. Method.      Cardiac Rehab from 08/02/2015 in [facility] Cardiac and Pulmonary Rehab   Date  07/26/15  Educator  SB   Instruction Review Code  2- meets goals/outcomes      Other Matters of the Heart: - Provides group verbal, written materials and models to describe Heart Failure, Angina, Valve Disease, and Diabetes in the realm of heart disease. Includes description of the disease process and treatment options available to the cardiac patient.   Exercise & Equipment Safety: - Individual verbal instruction and demonstration of equipment use and safety with use of the equipment.      Cardiac Rehab from 08/02/2015 in [facility] Cardiac and Pulmonary Rehab   Date  07/01/15   Educator  C. EnterkinRN   Instruction Review Code  1- partially meets, needs review/practice      Infection Prevention: - Provides verbal and written material to individual with discussion of infection control including proper hand washing and proper equipment cleaning during exercise session.      Cardiac Rehab from 08/02/2015 in [facility] Cardiac and Pulmonary Rehab   Date  07/01/15   Educator  C. EnterkinRN   Instruction Review Code  2- meets goals/outcomes      Falls Prevention: - Provides verbal and written material to individual with discussion of falls prevention and safety.      Cardiac Rehab from 08/02/2015 in [facility] Cardiac and Pulmonary Rehab   Date  07/01/15   Educator  C. Enterkin, RN   Instruction Review Code  2- meets goals/outcomes      Diabetes: - Individual verbal and written instruction to review signs/symptoms of diabetes, desired ranges of glucose level fasting, after meals and with exercise. Advice that pre and post exercise glucose checks will be done for 3  sessions at entry of program.    Knowledge Questionnaire Score:     Knowledge Questionnaire Score - 07/01/15 1532    Knowledge Questionnaire Score   Pre Score 24      Personal Goals and Risk Factors at Admission:     Personal Goals and Risk Factors at Admission - 07/01/15 1535    Core Components/Risk Factors/Patient Goals on Admission   Sedentary Yes   Intervention (read-only) While in program, learn and follow the exercise prescription taught. Start at a low level workload and increase workload after able to maintain previous level for 30 minutes. Increase time before increasing intensity.   Diabetes No   Hypertension Yes   Goal Participant will see blood pressure controlled within the values of 140/54m/Hg or within value directed by their physician.   Intervention (read-only) Provide nutrition & aerobic exercise along with prescribed medications to achieve BP 140/90 or less.   Lipids Yes   Goal Cholesterol controlled with medications as prescribed, with individualized exercise RX and with personalized nutrition plan. Value goals: LDL < 737m HDL > 4067mParticipant states understanding of desired cholesterol values and following prescriptions.   Intervention (read-only) Provide nutrition & aerobic exercise along with prescribed medications to achieve LDL <46m43mDL >40mg105mTake Less Medication Yes   Intervention Learn your risk factors and begin the lifestyle modifications for risk factor control during your time in the program.   Understand more about Heart/Pulmonary Disease. Yes      Personal Goals and Risk Factors Review:      Goals and Risk Factor Review      07/19/15 1533           Core Components/Risk Factors/Patient Goals Review   Personal Goals Review Increase Aerobic Exercise and Physical Activity       Increase Aerobic Exercise and Physical  Activity (read-only)   Goals Progress/Improvement seen  Yes       Comments Mandy Gilbert can exercise continuously for 30  minutes and in the next few weeks we hope to max that out at 45-50 minutes. She has increased her workloads on the NS and XR machine and is gaining strength and stamina. She is more comfortable on the machines and overall is very happy with the program and says she can see the exercise working. With more consistent attendance we hope to see further increases for Mandy Gilbert in the coming weeks and a translation of how this is improving her ADLs.           Personal Goals Discharge (Final Personal Goals and Risk Factors Review):      Goals and Risk Factor Review - 07/19/15 1533    Core Components/Risk Factors/Patient Goals Review   Personal Goals Review Increase Aerobic Exercise and Physical Activity   Increase Aerobic Exercise and Physical Activity (read-only)   Goals Progress/Improvement seen  Yes   Comments Mandy Gilbert can exercise continuously for 30 minutes and in the next few weeks we hope to max that out at 45-50 minutes. She has increased her workloads on the NS and XR machine and is gaining strength and stamina. She is more comfortable on the machines and overall is very happy with the program and says she can see the exercise working. With more consistent attendance we hope to see further increases for Mandy Gilbert in the coming weeks and a translation of how this is improving her ADLs.       ITP Comments:     ITP Comments      07/12/15 1038 07/12/15 1041 07/12/15 1044 07/26/15 0822 07/27/15 0833   ITP Comments Mandy Gilbert came to class todat stating she was not her normal self, She woke up last night not feeling right but no specfic concern. On arrival today she was noted to be in bigeminy rhythm. A new rhythm  to [place].  CAlled Dr Terrence Dupont and spoke to him aabout this rhythm, he spoke to Mandy Gilbert and advised her to increase her metoprolol to 3 times a day. He also will see her tomorrow at 2 pm in his office.  She was advised by Dr Terrence Dupont and myself to call 911 if she had increasing symptoms or concerns before her appointment.  She was told to have her husband drive her to the appointment.  This note is for 07/12/2015 arrival to the program. Mandy Gilbert wsa sent home to rest and relax until her appointment on 2/7 See ITP note placed in the 2/3 encounter for concerns on arrival today. No exercise today, education only, had to leave early due to appointment.  30 day review. Continue with ITP     07/27/15 0834 08/04/15 0723         ITP Comments Has attended a few sessions since orientation 30 day review   Continue with ITP         Comments:

## 2015-08-06 DIAGNOSIS — Z9861 Coronary angioplasty status: Secondary | ICD-10-CM

## 2015-08-06 DIAGNOSIS — I213 ST elevation (STEMI) myocardial infarction of unspecified site: Secondary | ICD-10-CM | POA: Diagnosis not present

## 2015-08-06 NOTE — Progress Notes (Signed)
Daily Session Note  Patient Details  Name: Mandy Gilbert MRN: [id number] Date of Birth: January 19, 1941 Referring Provider:  Charolette Forward, MD  Encounter Date: 08/06/2015  Check In:     Session Check In - 08/06/15 0945    Check-In   Location [facility]-Cardiac & Pulmonary Rehab   Staff Present Gerlene Burdock, RN, BSN;Susanne Bice, RN, BSN, CCRP;Earley Grobe, PT   Supervising physician immediately available to respond to emergencies See telemetry face sheet for immediately available ER MD   Medication changes reported     No   Fall or balance concerns reported    No   Warm-up and Cool-down Performed on first and last piece of equipment   Resistance Training Performed Yes   VAD Patient? No   Pain Assessment   Currently in Pain? No/denies         Goals Met:  Independence with exercise equipment Exercise tolerated well No report of cardiac concerns or symptoms Strength training completed today  Goals Unmet:  Not Applicable  Comments: Patient completed exercise prescription and all exercise goals during rehab session. The exercise was tolerated well and the patient is progressing in the program.   Dr. Emily Filbert is Medical Director for [facility].

## 2015-08-09 DIAGNOSIS — I213 ST elevation (STEMI) myocardial infarction of unspecified site: Secondary | ICD-10-CM | POA: Diagnosis not present

## 2015-08-09 DIAGNOSIS — Z9861 Coronary angioplasty status: Secondary | ICD-10-CM

## 2015-08-09 NOTE — Progress Notes (Signed)
Daily Session Note  Patient Details  Name: Mandy Gilbert MRN: [id number] Date of Birth: 1940/12/14 Referring Provider:  Charolette Forward, MD  Encounter Date: 08/09/2015  Check In:     Session Check In - 08/09/15 0852    Check-In   Location [facility]   Staff Present Heath Lark, RN, BSN, CCRP;Marquest Gunkel, Sherril Cong, BS, ACSM CEP, Exercise Physiologist   Supervising physician immediately available to respond to emergencies See telemetry face sheet for immediately available ER MD   Medication changes reported     No   Fall or balance concerns reported    No   Warm-up and Cool-down Performed on first and last piece of equipment   Resistance Training Performed Yes   VAD Patient? No   Pain Assessment   Currently in Pain? No/denies         Goals Met:  Independence with exercise equipment Exercise tolerated well No report of cardiac concerns or symptoms  Goals Unmet:  Not Applicable  Comments: Patient completed exercise prescription and all exercise goals during rehab session. The exercise was tolerated well and the patient is progressing in the program.    Dr. Emily Filbert is Medical Director for [place] and [facility].

## 2015-08-11 ENCOUNTER — Encounter: Payer: Medicare Other | Admitting: *Deleted

## 2015-08-11 DIAGNOSIS — I213 ST elevation (STEMI) myocardial infarction of unspecified site: Secondary | ICD-10-CM | POA: Diagnosis not present

## 2015-08-11 DIAGNOSIS — Z9861 Coronary angioplasty status: Secondary | ICD-10-CM

## 2015-08-11 NOTE — Progress Notes (Signed)
Daily Session Note  Patient Details  Name: Mandy Gilbert MRN: [id number] Date of Birth: 04-Apr-1941 Referring Provider:  Charolette Forward, MD  Encounter Date: 08/11/2015  Check In:     Session Check In - 08/11/15 0829    Check-In   Location [facility]   Staff Present Heath Lark, RN, BSN, CCRP;Renee Dillard Essex, MS, ACSM CEP, Exercise Physiologist;Stormi Vandevelde, BS, ACSM EP-C, Exercise Physiologist   Supervising physician immediately available to respond to emergencies See telemetry face sheet for immediately available ER MD   Medication changes reported     No   Fall or balance concerns reported    No   Warm-up and Cool-down Performed on first and last piece of equipment   Resistance Training Performed No   VAD Patient? No   Pain Assessment   Currently in Pain? No/denies         Goals Met:  Proper associated with RPD/PD & O2 Sat Exercise tolerated well No report of cardiac concerns or symptoms Strength training completed today  Goals Unmet:  Not Applicable  Comments:    Dr. Emily Filbert is Medical Director for [place] [facility].

## 2015-08-13 ENCOUNTER — Encounter: Payer: Medicare Other | Admitting: *Deleted

## 2015-08-13 DIAGNOSIS — I213 ST elevation (STEMI) myocardial infarction of unspecified site: Secondary | ICD-10-CM | POA: Diagnosis not present

## 2015-08-13 DIAGNOSIS — Z9861 Coronary angioplasty status: Secondary | ICD-10-CM

## 2015-08-13 NOTE — Progress Notes (Signed)
Daily Session Note  Patient Details  Name: Mandy Gilbert MRN: [id number] Date of Birth: 11/08/1940 Referring Provider:  Harwani, Mohan, MD  Encounter Date: 08/13/2015  Check In:     Session Check In - 08/13/15 0931    Check-In   Location [facility]   Staff Present Renee MacMillan, MS, ACSM CEP, Exercise Physiologist;Carroll Enterkin, RN, BSN;Stacey Joyce, RRT, RCP, Respiratory Therapist   Supervising physician immediately available to respond to emergencies See telemetry face sheet for immediately available ER MD   Medication changes reported     No   Fall or balance concerns reported    No   Warm-up and Cool-down Performed on first and last piece of equipment   Resistance Training Performed Yes   VAD Patient? No   Pain Assessment   Multiple Pain Sites No         Goals Met:  Independence with exercise equipment Exercise tolerated well No report of cardiac concerns or symptoms Strength training completed today  Goals Unmet:  Not Applicable  Comments: Patient completed exercise prescription and all exercise goals during rehab session. The exercise was tolerated well and the patient is progressing in the program.   Dr. Mark Miller is Medical Director for [facility] and [facility]. 

## 2015-08-16 ENCOUNTER — Encounter: Payer: Medicare Other | Admitting: *Deleted

## 2015-08-16 DIAGNOSIS — Z9861 Coronary angioplasty status: Secondary | ICD-10-CM

## 2015-08-16 DIAGNOSIS — I213 ST elevation (STEMI) myocardial infarction of unspecified site: Secondary | ICD-10-CM | POA: Diagnosis not present

## 2015-08-16 NOTE — Progress Notes (Signed)
Daily Session Note  Patient Details  Name: Mandy Gilbert MRN: [id number] Date of Birth: [birth year] Referring Provider:  Charolette Forward, MD  Encounter Date: 08/16/2015  Check In:     Session Check In - 08/16/15 0840    Check-In   Location [facility]   Staff Present Earlean Shawl, BS, ACSM CEP, Exercise Physiologist;Susanne Bice, RN, BSN, CCRP;Renee Dillard Essex, MS, ACSM CEP, Exercise Physiologist   Supervising physician immediately available to respond to emergencies See telemetry face sheet for immediately available ER MD   Medication changes reported     No   Fall or balance concerns reported    No   Warm-up and Cool-down Performed on first and last piece of equipment   Resistance Training Performed Yes   VAD Patient? No   Pain Assessment   Currently in Pain? No/denies   Multiple Pain Sites No         Goals Met:  Independence with exercise equipment Exercise tolerated well No report of cardiac concerns or symptoms Strength training completed today  Goals Unmet:  Not Applicable  Comments: Patient completed exercise prescription and all exercise goals during rehab session. The exercise was tolerated well and the patient is progressing in the program.     Dr. Emily Filbert is Medical Director for [place] [facility].

## 2015-08-18 DIAGNOSIS — I213 ST elevation (STEMI) myocardial infarction of unspecified site: Secondary | ICD-10-CM

## 2015-08-18 DIAGNOSIS — Z9861 Coronary angioplasty status: Secondary | ICD-10-CM

## 2015-08-18 NOTE — Progress Notes (Signed)
Daily Session Note  Patient Details  Name: Mandy Gilbert MRN: [id number] Date of Birth: 1940/11/04 Referring Provider:  Charolette Forward, MD  Encounter Date: 08/18/2015  Check In:     Session Check In - 08/18/15 0826    Check-In   Location [facility]   Staff Present Heath Lark, RN, BSN, CCRP;Renee Dillard Essex, MS, ACSM CEP, Exercise Physiologist;Camela Wich, BS, ACSM EP-C, Exercise Physiologist   Supervising physician immediately available to respond to emergencies See telemetry face sheet for immediately available ER MD   Medication changes reported     No   Fall or balance concerns reported    No   Warm-up and Cool-down Performed on first and last piece of equipment   Resistance Training Performed No   VAD Patient? No   Pain Assessment   Currently in Pain? No/denies         Goals Met:  Proper associated with RPD/PD & O2 Sat Exercise tolerated well No report of cardiac concerns or symptoms Strength training completed today  Goals Unmet:  Not Applicable  Comments:    Dr. Emily Filbert is Medical Director for [place] and [facility].

## 2015-08-20 ENCOUNTER — Encounter: Payer: Medicare Other | Admitting: *Deleted

## 2015-08-20 DIAGNOSIS — I213 ST elevation (STEMI) myocardial infarction of unspecified site: Secondary | ICD-10-CM | POA: Diagnosis not present

## 2015-08-20 DIAGNOSIS — Z9861 Coronary angioplasty status: Secondary | ICD-10-CM

## 2015-08-20 NOTE — Progress Notes (Signed)
Daily Session Note  Patient Details  Name: Mandy Gilbert MRN: [id number] Date of Birth: Jun 21, 1940 Referring Provider:  Charolette Forward, MD  Encounter Date: 08/20/2015  Check In:     Session Check In - 08/20/15 0815    Check-In   Location [facility]   Staff Present Candiss Norse, MS, ACSM CEP, Exercise Physiologist;Carroll Enterkin, RN, BSN;Susanne Bice, RN, BSN, CCRP   Supervising physician immediately available to respond to emergencies See telemetry face sheet for immediately available ER MD   Medication changes reported     No   Fall or balance concerns reported    No   Warm-up and Cool-down Performed on first and last piece of equipment   Resistance Training Performed Yes   VAD Patient? No   Pain Assessment   Currently in Pain? No/denies   Multiple Pain Sites No         Goals Met:  Independence with exercise equipment Exercise tolerated well No report of cardiac concerns or symptoms Strength training completed today  Goals Unmet:  Not Applicable  Comments: Patient completed exercise prescription and all exercise goals during rehab session. The exercise was tolerated well and the patient is progressing in the program.    Dr. Emily Filbert is Medical Director for [place] [facility].

## 2015-08-23 ENCOUNTER — Encounter: Payer: Medicare Other | Admitting: *Deleted

## 2015-08-23 DIAGNOSIS — I213 ST elevation (STEMI) myocardial infarction of unspecified site: Secondary | ICD-10-CM | POA: Diagnosis not present

## 2015-08-23 DIAGNOSIS — Z9861 Coronary angioplasty status: Secondary | ICD-10-CM

## 2015-08-23 NOTE — Progress Notes (Signed)
Daily Session Note  Patient Details  Name: Mandy Gilbert MRN: [id number] Date of Birth: 01-21-1941 Referring Provider:  Charolette Forward, MD  Encounter Date: 08/23/2015  Check In:     Session Check In - 08/23/15 0815    Check-In   Location [facility]   Staff Present Candiss Norse, MS, ACSM CEP, Exercise Physiologist;Chaslyn Eisen Amedeo Plenty, BS, ACSM CEP, Exercise Physiologist;Susanne Bice, RN, BSN, CCRP   Supervising physician immediately available to respond to emergencies See telemetry face sheet for immediately available ER MD   Medication changes reported     No   Fall or balance concerns reported    No   Warm-up and Cool-down Performed on first and last piece of equipment   Resistance Training Performed Yes   VAD Patient? No   Pain Assessment   Currently in Pain? No/denies   Multiple Pain Sites No         Goals Met:  Independence with exercise equipment Exercise tolerated well No report of cardiac concerns or symptoms Strength training completed today  Goals Unmet:  Not Applicable  Comments: Patient completed exercise prescription and all exercise goals during rehab session. The exercise was tolerated well and the patient is progressing in the program.     Dr. Emily Filbert is Medical Director for [place] [facility].

## 2015-08-25 DIAGNOSIS — I2510 Atherosclerotic heart disease of native coronary artery without angina pectoris: Secondary | ICD-10-CM | POA: Insufficient documentation

## 2015-08-25 DIAGNOSIS — I213 ST elevation (STEMI) myocardial infarction of unspecified site: Secondary | ICD-10-CM

## 2015-08-25 DIAGNOSIS — Z0000 Encounter for general adult medical examination without abnormal findings: Secondary | ICD-10-CM | POA: Insufficient documentation

## 2015-08-25 DIAGNOSIS — Z9861 Coronary angioplasty status: Secondary | ICD-10-CM

## 2015-08-25 NOTE — Progress Notes (Addendum)
Daily Session Note  Patient Details  Name: Mandy Gilbert MRN: [id number] Date of Birth: 07/25/1940 Referring Provider:  Harwani, Mohan, MD  Encounter Date: 08/25/2015  Check In:     Session Check In - 08/25/15 0826    Check-In   Location [facility]   Staff Present Susanne Bice, RN, BSN, CCRP;Renee MacMillan, MS, ACSM CEP, Exercise Physiologist;Steven Way, BS, ACSM EP-C, Exercise Physiologist   Supervising physician immediately available to respond to emergencies See telemetry face sheet for immediately available ER MD   Medication changes reported     No   Fall or balance concerns reported    No   Warm-up and Cool-down Performed on first and last piece of equipment   Resistance Training Performed No   VAD Patient? No   Pain Assessment   Currently in Pain? No/denies         Goals Met:  Proper associated with RPD/PD & O2 Sat Exercise tolerated well No report of cardiac concerns or symptoms Strength training completed today  Goals Unmet:  Not Applicable  Comments: Mandy Gilbert attended the morning class (8:00am) education session and the afternoon (3:45) exercise session. Her total time in each of these classes totaled 95 minutes.   Dr. Mark Miller is Medical Director for [facility] and [facility]. 

## 2015-08-26 ENCOUNTER — Encounter: Payer: Self-pay | Admitting: *Deleted

## 2015-08-26 DIAGNOSIS — I213 ST elevation (STEMI) myocardial infarction of unspecified site: Secondary | ICD-10-CM

## 2015-08-26 DIAGNOSIS — Z9861 Coronary angioplasty status: Secondary | ICD-10-CM

## 2015-08-26 NOTE — Progress Notes (Signed)
Cardiac Individual Treatment Plan  Patient Details  Name: Mandy Gilbert MRN: [id number] Date of Birth: Oct 28, 1940 Referring Provider:  Charolette Forward, MD  Initial Encounter Date:       Cardiac Rehab from 07/01/2015 in [facility] Cardiac and Pulmonary Rehab   Date  07/01/15      Visit Diagnosis: S/P PTCA (percutaneous transluminal coronary angioplasty)  ST elevation myocardial infarction (STEMI), unspecified artery ([place])  Patient's Home Medications on Admission:  Current outpatient prescriptions:  .  aspirin EC 81 MG EC tablet, Take 1 tablet (81 mg total) by mouth daily., Disp: 30 tablet, Rfl: 3 .  atorvastatin (LIPITOR) 20 MG tablet, , Disp: , Rfl:  .  atorvastatin (LIPITOR) 80 MG tablet, Take 1 tablet (80 mg total) by mouth daily at 6 PM., Disp: 30 tablet, Rfl: 3 .  Cholecalciferol (VITAMIN D PO), Take 1,000 Units by mouth every morning. , Disp: , Rfl:  .  DILT-XR 240 MG 24 hr capsule, , Disp: , Rfl:  .  indapamide (LOZOL) 1.25 MG tablet, , Disp: , Rfl:  .  metoprolol tartrate (LOPRESSOR) 25 MG tablet, Take 1 tablet (25 mg total) by mouth 2 (two) times daily. (Patient taking differently: Take 25 mg by mouth 3 (three) times daily. ), Disp: 60 tablet, Rfl: 3 .  nitroGLYCERIN (NITROSTAT) 0.4 MG SL tablet, Place 1 tablet (0.4 mg total) under the tongue every 5 (five) minutes x 3 doses as needed for chest pain., Disp: 25 tablet, Rfl: 12 .  propranolol (INDERAL) 20 MG tablet, , Disp: , Rfl:  .  Psyllium (METAMUCIL PO), Take 3 capsules by mouth every morning., Disp: , Rfl:  .  ticagrelor (BRILINTA) 90 MG TABS tablet, Take 1 tablet (90 mg total) by mouth every 12 (twelve) hours., Disp: 60 tablet, Rfl: 11  Past Medical History: Past Medical History  Diagnosis Date  . Hypertension   . Constipation   . Cancer (HCC)     Skin cancer legs, basal cell.    Tobacco Use: History  Smoking status  . Never Smoker   Smokeless tobacco  . Not on file    Labs: Recent Review Flowsheet Data    Labs  for ITP Cardiac and Pulmonary Rehab Latest Ref Rng 06/11/2015 06/11/2015 06/11/2015 06/11/2015 06/12/2015   Cholestrol 0 - 200 mg/dL - 156 - - 173   LDLCALC 0 - 99 mg/dL - 82 - - 90   HDL >40 mg/dL - 63 - - 74   Trlycerides <150 mg/dL - 56 - - 44   Hemoglobin A1c 4.8 - 5.6 % - - 5.8(H) 5.8(H) -   TCO2 0 - 100 mmol/L 14 - - - -       Exercise Target Goals:    Exercise Program Goal: Individual exercise prescription set with THRR, safety & activity barriers. Participant demonstrates ability to understand and report RPE using BORG scale, to self-measure pulse accurately, and to acknowledge the importance of the exercise prescription.  Exercise Prescription Goal: Starting with aerobic activity 30 plus minutes a day, 3 days per week for initial exercise prescription. Provide home exercise prescription and guidelines that participant acknowledges understanding prior to discharge.  Activity Barriers & Risk Stratification:     Activity Barriers & Cardiac Risk Stratification - 07/01/15 1531    Activity Barriers & Cardiac Risk Stratification   Activity Barriers Arthritis;Back Problems   Cardiac Risk Stratification Moderate      6 Minute Walk:     6 Minute Walk  07/01/15 1452       6 Minute Walk   Phase Initial     Distance 1300 feet     Walk Time 6 minutes     RPE 11     Symptoms No     Resting HR 87 bpm     Resting BP 128/84 mmHg     Max Ex. HR 106 bpm     Max Ex. BP 142/74 mmHg        Initial Exercise Prescription:     Initial Exercise Prescription - 07/01/15 1400    Date of Initial Exercise Prescription   Date 07/01/15   Treadmill   MPH 2.5   Grade 0   Minutes 10   Recumbant Bike   Level 2   RPM 40   Watts 20   Minutes 10   NuStep   Level 2   Watts 40   Minutes 10   Arm Ergometer   Level 1   Watts 10   Minutes 10   Recumbant Elliptical   Level 2   RPM 40   Watts 20   Minutes 10   Elliptical   Level 1   Speed 3   Minutes 1   REL-XR   Level 2    Watts 50   Minutes 10   T5 Nustep   Level 1   Watts 15   Minutes 10   Biostep-RELP   Level 2   Watts 40   Minutes 10   Prescription Details   Frequency (times per week) 3   Duration Progress to 30 minutes of continuous aerobic without signs/symptoms of physical distress   Intensity   THRR REST +  30   Ratings of Perceived Exertion 11-15   Perceived Dyspnea 2-4   Progression   Progression Continue progressive overload as per policy without signs/symptoms or physical distress.   Resistance Training   Training Prescription Yes   Weight 2   Reps 10-15      Perform Capillary Blood Glucose checks as needed.  Exercise Prescription Changes:     Exercise Prescription Changes      07/19/15 1500 07/21/15 0900 07/30/15 0900 08/02/15 1000 08/11/15 0800   Exercise Review   Progression Yes Yes Yes Yes Yes   Response to Exercise   Blood Pressure (Admit) 128/88 mmHg       Blood Pressure (Exercise) 130/86 mmHg       Blood Pressure (Exit) 110/62 mmHg       Heart Rate (Admit) 96 bpm       Heart Rate (Exercise) 87 bpm       Heart Rate (Exit) 89 bpm       Rating of Perceived Exertion (Exercise) 13       Symptoms None None None None None   Comments Alanda had a small set back with her blood pressure and heart rate and was out for a few sessions. She hopes to have more consistent attendance from now on. Despite only having 3 exercise sessions she is already feeling more comfortable on the equipment and has made some increases to her workloads. Added the TM to her routine and did very well! Added the TM to her routine and did very well! Reviewed individualized exercise prescription and made increases per departmental policy. Exercise increases were discussed with the patient and they were able to perform the new work loads without issue (no signs or symptoms).  Adjusted ex. rx. due to increase in stamina and strength.  Duration Progress to 30 minutes of continuous aerobic without signs/symptoms of  physical distress Progress to 30 minutes of continuous aerobic without signs/symptoms of physical distress Progress to 30 minutes of continuous aerobic without signs/symptoms of physical distress Progress to 30 minutes of continuous aerobic without signs/symptoms of physical distress Progress to 30 minutes of continuous aerobic without signs/symptoms of physical distress   Intensity Rest + 30 Rest + 30 Rest + 30 Rest + 30 Rest + 30   Progression   Progression Continue progressive overload as per policy without signs/symptoms or physical distress. Continue progressive overload as per policy without signs/symptoms or physical distress. Continue progressive overload as per policy without signs/symptoms or physical distress. Continue progressive overload as per policy without signs/symptoms or physical distress. Continue progressive overload as per policy without signs/symptoms or physical distress.   Resistance Training   Training Prescription     Yes   Weight     3   Reps     10-15   Training Prescription (read-only) Yes Yes Yes Yes    Weight (read-only) 2 2 3 3     Reps (read-only) 10-15 10-15 10-15 10-15    Interval Training   Interval Training No No No No No   Treadmill   MPH     1.7   Grade     0   Minutes     15   MPH (read-only)  1.5 1.5 1.7    Grade (read-only)  0 0 0    Minutes (read-only)  10 10 15     NuStep   Level     5   Watts     90   Minutes     20   Level (read-only) 3 3 3 4     Watts (read-only) 45 45 45 50    Minutes (read-only) 20 20 20 20     REL-XR   Level     3   Watts     50   Minutes     20   Level (read-only) 3 3 3 3     Watts (read-only) 50 50 50 50    Minutes (read-only) 20 20 20 20       08/16/15 0900 08/18/15 0800 08/23/15 0900       Exercise Review   Progression Yes Yes Yes     Response to Exercise   Blood Pressure (Admit) 120/70 mmHg       Blood Pressure (Exercise) 144/68 mmHg       Blood Pressure (Exit) 120/78 mmHg       Heart Rate (Admit) 89 bpm        Heart Rate (Exercise) 120 bpm       Heart Rate (Exit) 85 bpm       Rating of Perceived Exertion (Exercise) 13       Symptoms None None None     Comments Helped Jaryn with her goal of having 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 We will start her on interval training and continuously progress her workloads in an effort to increase her stamina. Itha will still maintain her 45 minutes of aerobic work.        Duration Progress to 30 minutes of continuous aerobic without signs/symptoms of physical distress Progress to 30 minutes of continuous aerobic without signs/symptoms of physical distress Progress to 50 minutes of aerobic without signs/symptoms of physical distress     Intensity Rest + 30 Rest + 30 Rest + 30  Progression   Progression Continue progressive overload as per policy without signs/symptoms or physical distress. Continue progressive overload as per policy without signs/symptoms or physical distress. Continue progressive overload as per policy without signs/symptoms or physical distress.     Resistance Training   Training Prescription Yes Yes Yes     Weight 3 3 3      Reps 10-15 10-15 10-15     Interval Training   Interval Training No Yes Yes     Equipment  REL-XR REL-XR     Comments  L5-7 90-100 watts L5-7 90-100 watts     Treadmill   MPH 2 2 2      Grade 3 3 3      Minutes 15 15 15      NuStep   Level 5 5 5      Watts 90 90 90     Minutes 20 20 20      REL-XR   Level 3 3 3      Watts 50 50 50     Minutes 20 20 20         Exercise Comments:   Discharge Exercise Prescription (Final Exercise Prescription Changes):     Exercise Prescription Changes - 08/23/15 0900    Exercise Review   Progression Yes   Response to Exercise   Symptoms None   Duration Progress to 50 minutes of aerobic without signs/symptoms of physical distress   Intensity Rest + 30   Progression   Progression Continue progressive overload as per policy without signs/symptoms or  physical distress.   Resistance Training   Training Prescription Yes   Weight 3   Reps 10-15   Interval Training   Interval Training Yes   Equipment REL-XR   Comments L5-7 90-100 watts   Treadmill   MPH 2   Grade 3   Minutes 15   NuStep   Level 5   Watts 90   Minutes 20   REL-XR   Level 3   Watts 50   Minutes 20      Nutrition:  Target Goals: Understanding of nutrition guidelines, daily intake of sodium <1553m, cholesterol <2047m calories 30% from fat and 7% or less from saturated fats, daily to have 5 or more servings of fruits and vegetables.  Biometrics:     Pre Biometrics - 07/01/15 1455    Pre Biometrics   Height 5' 5.5" (1.664 m)   Weight 143 lb (64.864 kg)   Waist Circumference 37 inches   Hip Circumference 42.5 inches   Waist to Hip Ratio 0.87 %   BMI (Calculated) 23.5       Nutrition Therapy Plan and Nutrition Goals:     Nutrition Therapy & Goals - 07/01/15 1535    Nutrition Therapy   Drug/Food Interactions Statins/Certain Fruits   Intervention Plan   Intervention (read-only) Using nutrition plan and personal goals to gain a healthy nutrition lifestyle. Add exercise as prescribed.      Nutrition Discharge: Rate Your Plate Scores:   Nutrition Goals Re-Evaluation:   Psychosocial: Target Goals: Acknowledge presence or absence of depression, maximize coping skills, provide positive support system. Participant is able to verbalize types and ability to use techniques and skills needed for reducing stress and depression.  Initial Review & Psychosocial Screening:     Initial Psych Review & Screening - 07/01/15 15RenovoYes   Comments EvCarlaaid she is busy all the time but expect for walking she has not  done any formal exercise and is anxious what to expect. Samariah is very active volunteering at her church and meals on wheels etc. Very supportive husband. Very   Screening Interventions   Interventions Encouraged  to exercise      Quality of Life Scores:     Quality of Life - 07/01/15 1537    Quality of Life Scores   Health/Function Pre 14.89 %   Socioeconomic Pre 25.8 %   Psych/Spiritual Pre 24.64 %   Family Pre 27.6 %   GLOBAL Pre 20.9 %      PHQ-9:     Recent Review Flowsheet Data    Depression screen [facility] 2/9 07/01/2015   Decreased Interest 1   Down, Depressed, Hopeless 1   PHQ - 2 Score 2   Altered sleeping 1   Tired, decreased energy 2   Change in appetite 2   Feeling bad or failure about yourself  1   Trouble concentrating 1   Moving slowly or fidgety/restless 0   Suicidal thoughts 0   PHQ-9 Score 9      Psychosocial Evaluation and Intervention:     Psychosocial Evaluation - 07/19/15 0948    Psychosocial Evaluation & Interventions   Interventions Encouraged to exercise with the program and follow exercise prescription   Comments Counselor met with Ms. Ramseur today for initial psychosocial evaluation.  She is a 75 year old well-adjusted individual who had a heart attack on 1/6.   She has a strong support system with a spouse of almost 9 years and several adult sons who are very supportive, as well as active involvement in her local church.  Ms. Stehr reports she is sleeping "okay" since her heart attack and her appetite has decreased as well.  She denies a history of depression or anxiety or any current symptoms.  She states that her mood is typically positive and she stays very active in the community with volunteerism.  Her current stressors are her health and that of her spouse as well.  Ms. Sipe has goals to increase her stamina and strength while in this program and continue to lose some weight as she learns more about healthy eating.  She plans to join the [employer] class following this program as well as walk to more consistently exercise.     Continued Psychosocial Services Needed Yes  Ms. Dobrowolski will benefit from meeting with the dietician to address her  weight loss goals.  She will also benefit from consistent exercise.       Psychosocial Re-Evaluation:   Vocational Rehabilitation: Provide vocational rehab assistance to qualifying candidates.   Vocational Rehab Evaluation & Intervention:     Vocational Rehab - 07/01/15 1533    Initial Vocational Rehab Evaluation & Intervention   Assessment shows need for Vocational Rehabilitation No      Education: Education Goals: Education classes will be provided on a weekly basis, covering required topics. Participant will state understanding/return demonstration of topics presented.  Learning Barriers/Preferences:     Learning Barriers/Preferences - 07/01/15 1532    Learning Barriers/Preferences   Learning Barriers None   Learning Preferences Video;Written Material      Education Topics: General Nutrition Guidelines/Fats and Fiber: -Group instruction provided by verbal, written material, models and posters to present the general guidelines for heart healthy nutrition. Gives an explanation and review of dietary fats and fiber.   Controlling Sodium/Reading Food Labels: -Group verbal and written material supporting the discussion of sodium use in heart healthy nutrition.  Review and explanation with models, verbal and written materials for utilization of the food label.          Cardiac Rehab from 08/25/2015 in [facility] Cardiac and Pulmonary Rehab   Date  08/09/15   Educator  [facility]   Instruction Review Code  2- meets goals/outcomes      Exercise Physiology & Risk Factors: - Group verbal and written instruction with models to review the exercise physiology of the cardiovascular system and associated critical values. Details cardiovascular disease risk factors and the goals associated with each risk factor.      Cardiac Rehab from 08/25/2015 in [facility] Cardiac and Pulmonary Rehab   Date  08/02/15   Educator  RM   Instruction Review Code  2- meets goals/outcomes      Aerobic Exercise &  Resistance Training: - Gives group verbal and written discussion on the health impact of inactivity. On the components of aerobic and resistive training programs and the benefits of this training and how to safely progress through these programs.      Cardiac Rehab from 08/25/2015 in [facility] Cardiac and Pulmonary Rehab   Date  08/04/15   Educator  SW   Instruction Review Code  2- meets goals/outcomes      Flexibility, Balance, General Exercise Guidelines: - Provides group verbal and written instruction on the benefits of flexibility and balance training programs. Provides general exercise guidelines with specific guidelines to those with heart or lung disease. Demonstration and skill practice provided.      Cardiac Rehab from 08/25/2015 in [facility] Cardiac and Pulmonary Rehab   Date  08/09/15   Educator  [facility]   Instruction Review Code  2- meets goals/outcomes      Stress Management: - Provides group verbal and written instruction about the health risks of elevated stress, cause of high stress, and healthy ways to reduce stress.      Cardiac Rehab from 08/25/2015 in [facility] Cardiac and Pulmonary Rehab   Date  08/11/15   Educator  [facility]   Instruction Review Code  2- meets goals/outcomes      Depression: - Provides group verbal and written instruction on the correlation between heart/lung disease and depressed mood, treatment options, and the stigmas associated with seeking treatment.      Cardiac Rehab from 08/25/2015 in [facility] Cardiac and Pulmonary Rehab   Date  07/28/15   Educator  [facility]   Instruction Review Code  2- meets goals/outcomes      Anatomy & Physiology of the Heart: - Group verbal and written instruction and models provide basic cardiac anatomy and physiology, with the coronary electrical and arterial systems. Review of: AMI, Angina, Valve disease, Heart Failure, Cardiac Arrhythmia, Pacemakers, and the ICD.      Cardiac Rehab from 08/25/2015 in [facility] Cardiac and Pulmonary Rehab   Date   08/16/15   Educator  SB   Instruction Review Code  2- meets goals/outcomes      Cardiac Procedures: - Group verbal and written instruction and models to describe the testing methods done to diagnose heart disease. Reviews the outcomes of the test results. Describes the treatment choices: Medical Management, Angioplasty, or Coronary Bypass Surgery.      Cardiac Rehab from 08/25/2015 in [facility] Cardiac and Pulmonary Rehab   Date  08/23/15   Educator  SB   Instruction Review Code  2- meets goals/outcomes      Cardiac Medications: - Group verbal and written instruction to review commonly prescribed medications for heart disease.  Reviews the medication, class of the drug, and side effects. Includes the steps to properly store meds and maintain the prescription regimen.      Cardiac Rehab from 08/25/2015 in [facility] Cardiac and Pulmonary Rehab   Date  08/25/15   Educator  SB   Instruction Review Code  2- meets goals/outcomes      Go Sex-Intimacy & Heart Disease, Get SMART - Goal Setting: - Group verbal and written instruction through game format to discuss heart disease and the return to sexual intimacy. Provides group verbal and written material to discuss and apply goal setting through the application of the S.M.A.R.T. Method.      Cardiac Rehab from 08/25/2015 in [facility] Cardiac and Pulmonary Rehab   Date  08/23/15   Educator  SB   Instruction Review Code  2- meets goals/outcomes      Other Matters of the Heart: - Provides group verbal, written materials and models to describe Heart Failure, Angina, Valve Disease, and Diabetes in the realm of heart disease. Includes description of the disease process and treatment options available to the cardiac patient.   Exercise & Equipment Safety: - Individual verbal instruction and demonstration of equipment use and safety with use of the equipment.      Cardiac Rehab from 08/25/2015 in [facility] Cardiac and Pulmonary Rehab   Date  07/01/15   Educator  C.  EnterkinRN   Instruction Review Code  1- partially meets, needs review/practice      Infection Prevention: - Provides verbal and written material to individual with discussion of infection control including proper hand washing and proper equipment cleaning during exercise session.      Cardiac Rehab from 08/25/2015 in [facility] Cardiac and Pulmonary Rehab   Date  07/01/15   Educator  C. EnterkinRN   Instruction Review Code  2- meets goals/outcomes      Falls Prevention: - Provides verbal and written material to individual with discussion of falls prevention and safety.      Cardiac Rehab from 08/25/2015 in [facility] Cardiac and Pulmonary Rehab   Date  07/01/15   Educator  C. Enterkin, RN   Instruction Review Code  2- meets goals/outcomes      Diabetes: - Individual verbal and written instruction to review signs/symptoms of diabetes, desired ranges of glucose level fasting, after meals and with exercise. Advice that pre and post exercise glucose checks will be done for 3 sessions at entry of program.    Knowledge Questionnaire Score:     Knowledge Questionnaire Score - 07/01/15 1532    Knowledge Questionnaire Score   Pre Score 24      Personal Goals and Risk Factors at Admission:     Personal Goals and Risk Factors at Admission - 07/01/15 1535    Core Components/Risk Factors/Patient Goals on Admission   Sedentary Yes   Intervention (read-only) While in program, learn and follow the exercise prescription taught. Start at a low level workload and increase workload after able to maintain previous level for 30 minutes. Increase time before increasing intensity.   Diabetes No   Hypertension Yes   Goal Participant will see blood pressure controlled within the values of 140/41m/Hg or within value directed by their physician.   Intervention (read-only) Provide nutrition & aerobic exercise along with prescribed medications to achieve BP 140/90 or less.   Lipids Yes   Goal Cholesterol  controlled with medications as prescribed, with individualized exercise RX and with personalized nutrition plan. Value goals: LDL < 746m HDL >  52m. Participant states understanding of desired cholesterol values and following prescriptions.   Intervention (read-only) Provide nutrition & aerobic exercise along with prescribed medications to achieve LDL <715m HDL >4034m  Take Less Medication Yes   Intervention Learn your risk factors and begin the lifestyle modifications for risk factor control during your time in the program.   Understand more about Heart/Pulmonary Disease. Yes      Personal Goals and Risk Factors Review:      Goals and Risk Factor Review      07/19/15 1533 08/16/15 0935         Core Components/Risk Factors/Patient Goals Review   Personal Goals Review Increase Aerobic Exercise and Physical Activity Weight Management/Obesity;Hypertension;Lipids;Other      Review  EvaKymberlee having blood work done on 08/18/15 and will update us Koreater she receives her results on her cholesterol levels. Ellyn's BP has been in acceptable ranges during her time in cardiac rehab and continues to take her BP medication, there is no talk from her MD to come off of her BP meds. EvaAleishauld like to come off her blood thinner and will talk to her MD in April about this matter, EvaLois within an acceptable body composition but would like to lose weight. She has lost a few pounds during the program but would still like to lose a few more. 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 She would like to increase her exercise work to gain strength and stamina to work this shift.       Increase Aerobic Exercise and Physical Activity (read-only)   Goals Progress/Improvement seen  Yes       Comments EvaAudreyannan exercise continuously for 30 minutes and in the next few weeks we hope to max that out at 45-50 minutes. She has increased  her workloads on the NS and XR machine and is gaining strength and stamina. She is more comfortable on the machines and overall is very happy with the program and says she can see the exercise working. With more consistent attendance we hope to see further increases for EvaTwylla the coming weeks and a translation of how this is improving her ADLs.           Personal Goals Discharge (Final Personal Goals and Risk Factors Review):      Goals and Risk Factor Review - 08/16/15 0935    Core Components/Risk Factors/Patient Goals Review   Personal Goals Review Weight Management/Obesity;Hypertension;Lipids;Other   Review EvaJuliahna having blood work done on 08/18/15 and will update us Koreater she receives her results on her cholesterol levels. Avy's BP has been in acceptable ranges during her time in cardiac rehab and continues to take her BP medication, there is no talk from her MD to come off of her BP meds. EvaAnastashauld like to come off her blood thinner and will talk to her MD in April about this matter, EvaNaureen within an acceptable body composition but would like to lose weight. She has lost a few pounds during the program but would still like to lose a few more. 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 She would like to increase her exercise work to gain strength and stamina to work this shift.  ITP Comments:     ITP Comments      07/12/15 1038 07/12/15 1041 07/12/15 1044 07/26/15 0822 07/27/15 0833   ITP Comments Villa came to class todat stating she was not her normal self, She woke up last night not feeling right but no specfic concern. On arrival today she was noted to be in bigeminy rhythm. A new rhythm  to [place].  CAlled Dr Terrence Dupont and spoke to him aabout this rhythm, he spoke to Joseph and advised her to increase her metoprolol to 3 times a day. He also will see her tomorrow at 2 pm in his office.  She was  advised by Dr Terrence Dupont and myself to call 911 if she had increasing symptoms or concerns before her appointment. She was told to have her husband drive her to the appointment.  This note is for 07/12/2015 arrival to the program. Harmon Pier wsa sent home to rest and relax until her appointment on 2/7 See ITP note placed in the 2/3 encounter for concerns on arrival today. No exercise today, education only, had to leave early due to appointment.  30 day review. Continue with ITP     07/27/15 0834 08/04/15 0723 08/26/15 1318       ITP Comments Has attended a few sessions since orientation 30 day review   Continue with ITP 30 Day Review. Continue with the ITP.        Comments:

## 2015-08-27 ENCOUNTER — Encounter: Payer: Medicare Other | Admitting: *Deleted

## 2015-08-27 DIAGNOSIS — I213 ST elevation (STEMI) myocardial infarction of unspecified site: Secondary | ICD-10-CM

## 2015-08-27 DIAGNOSIS — Z9861 Coronary angioplasty status: Secondary | ICD-10-CM

## 2015-08-27 NOTE — Progress Notes (Signed)
Daily Session Note  Patient Details  Name: [name] MRN: [id number] Date of Birth: 1941-02-08 Referring Provider:  Charolette Forward, MD  Encounter Date: 08/27/2015  Check In:     Session Check In - 08/27/15 0908    Check-In   Location [facility]   Staff Present Heath Lark, RN, BSN, CCRP;Lenita Peregrina, RN, Michaela Corner, RRT, RCP, Respiratory Therapist   Supervising physician immediately available to respond to emergencies See telemetry face sheet for immediately available ER MD   Medication changes reported     Yes   Fall or balance concerns reported    No   Warm-up and Cool-down Performed on first and last piece of equipment   Resistance Training Performed Yes   VAD Patient? No   Pain Assessment   Currently in Pain? No/denies         Goals Met:  Proper associated with RPD/PD & O2 Sat Exercise tolerated well  Goals Unmet:  Not Applicable  Comments:    Dr. Emily Filbert is Medical Director for [place] and [facility].

## 2015-08-30 ENCOUNTER — Encounter: Payer: Medicare Other | Admitting: *Deleted

## 2015-08-30 DIAGNOSIS — Z9861 Coronary angioplasty status: Secondary | ICD-10-CM

## 2015-08-30 DIAGNOSIS — I213 ST elevation (STEMI) myocardial infarction of unspecified site: Secondary | ICD-10-CM

## 2015-08-30 NOTE — Progress Notes (Signed)
Daily Session Note  Patient Details  Name: Mandy Gilbert MRN: [id number] Date of Birth: July 25, 1940 Referring Provider:  Charolette Forward, MD  Encounter Date: 08/30/2015  Check In:     Session Check In - 08/30/15 0818    Check-In   Location [facility]   Staff Present Earlean Shawl, BS, ACSM CEP, Exercise Physiologist;Susanne Bice, RN, BSN, CCRP;Renee Dillard Essex, MS, ACSM CEP, Exercise Physiologist   Supervising physician immediately available to respond to emergencies See telemetry face sheet for immediately available ER MD   Medication changes reported     No   Fall or balance concerns reported    No   Warm-up and Cool-down Performed on first and last piece of equipment   Resistance Training Performed Yes   VAD Patient? No   Pain Assessment   Currently in Pain? No/denies   Multiple Pain Sites No         Goals Met:  Independence with exercise equipment Exercise tolerated well No report of cardiac concerns or symptoms Strength training completed today  Goals Unmet:  Not Applicable  Comments: Patient completed exercise prescription and all exercise goals during rehab session. The exercise was tolerated well and the patient is progressing in the program.     Dr. Emily Filbert is Medical Director for [place] and [facility].

## 2015-09-01 DIAGNOSIS — Z9861 Coronary angioplasty status: Secondary | ICD-10-CM

## 2015-09-01 DIAGNOSIS — I213 ST elevation (STEMI) myocardial infarction of unspecified site: Secondary | ICD-10-CM | POA: Diagnosis not present

## 2015-09-01 NOTE — Progress Notes (Signed)
Daily Session Note  Patient Details  Name: Mandy Gilbert MRN: [id number] Date of Birth: [birth year] Referring Provider:  Charolette Forward, MD  Encounter Date: 09/01/2015  Check In:     Session Check In - 09/01/15 0831    Check-In   Location [facility]   Staff Present Heath Lark, RN, BSN, CCRP;Renee Dillard Essex, MS, ACSM CEP, Exercise Physiologist;Brandilee Pies, BS, ACSM EP-C, Exercise Physiologist   Supervising physician immediately available to respond to emergencies See telemetry face sheet for immediately available ER MD   Medication changes reported     No   Fall or balance concerns reported    No   Warm-up and Cool-down Performed on first and last piece of equipment   Resistance Training Performed No   VAD Patient? No   Pain Assessment   Currently in Pain? No/denies         Goals Met:  Proper associated with RPD/PD & O2 Sat Exercise tolerated well No report of cardiac concerns or symptoms Strength training completed today  Goals Unmet:  Not Applicable  Comments:    Dr. Emily Filbert is Medical Director for [place] and [facility].

## 2015-09-03 ENCOUNTER — Encounter: Payer: Medicare Other | Admitting: *Deleted

## 2015-09-03 DIAGNOSIS — I213 ST elevation (STEMI) myocardial infarction of unspecified site: Secondary | ICD-10-CM | POA: Diagnosis not present

## 2015-09-03 DIAGNOSIS — Z9861 Coronary angioplasty status: Secondary | ICD-10-CM

## 2015-09-03 NOTE — Progress Notes (Signed)
Daily Session Note  Patient Details  Name: Mandy Gilbert MRN: [id number] Date of Birth: 14-Feb-1941 Referring Provider:  Charolette Forward, MD  Encounter Date: 09/03/2015  Check In:     Session Check In - 09/03/15 0839    Check-In   Location [facility]   Staff Present Candiss Norse, MS, ACSM CEP, Exercise Physiologist;Carroll Enterkin, RN, BSN   Supervising physician immediately available to respond to emergencies See telemetry face sheet for immediately available ER MD   Medication changes reported     No   Fall or balance concerns reported    No   Warm-up and Cool-down Performed on first and last piece of equipment   Resistance Training Performed Yes   VAD Patient? No   Pain Assessment   Currently in Pain? No/denies   Multiple Pain Sites No         Goals Met:  Independence with exercise equipment Exercise tolerated well No report of cardiac concerns or symptoms Strength training completed today  Goals Unmet:  Not Applicable  Comments: Patient completed exercise prescription and all exercise goals during rehab session. The exercise was tolerated well and the patient is progressing in the program.    Dr. Emily Filbert is Medical Director for [place] and [facility].

## 2015-09-06 ENCOUNTER — Encounter: Payer: Medicare Other | Attending: Cardiology | Admitting: *Deleted

## 2015-09-06 ENCOUNTER — Other Ambulatory Visit: Payer: Self-pay | Admitting: Internal Medicine

## 2015-09-06 VITALS — Ht 65.5 in | Wt 143.0 lb

## 2015-09-06 DIAGNOSIS — I213 ST elevation (STEMI) myocardial infarction of unspecified site: Secondary | ICD-10-CM | POA: Insufficient documentation

## 2015-09-06 DIAGNOSIS — Z9861 Coronary angioplasty status: Secondary | ICD-10-CM | POA: Diagnosis present

## 2015-09-06 DIAGNOSIS — Z1231 Encounter for screening mammogram for malignant neoplasm of breast: Secondary | ICD-10-CM

## 2015-09-06 NOTE — Progress Notes (Signed)
Daily Session Note  Patient Details  Name: Mandy [facility] MRN: [id number] Date of Birth: 1940-09-04 Referring Provider:  Charolette Forward, MD  Encounter Date: 09/06/2015  Check In:     Session Check In - 09/06/15 0842    Check-In   Location [facility]   Staff Present Nyoka Cowden, Mauricia Area, BS, ACSM CEP, Exercise Physiologist;Steven Way, BS, ACSM EP-C, Exercise Physiologist   Supervising physician immediately available to respond to emergencies See telemetry face sheet for immediately available ER MD   Medication changes reported     No   Fall or balance concerns reported    No   Warm-up and Cool-down Performed on first and last piece of equipment   Resistance Training Performed Yes   VAD Patient? No   Pain Assessment   Currently in Pain? No/denies   Multiple Pain Sites No           Exercise Prescription Changes - 09/06/15 0800    Exercise Review   Progression Yes   Response to Exercise   Symptoms None   Duration Progress to 50 minutes of aerobic without signs/symptoms of physical distress   Intensity Rest + 30   Progression   Progression Continue progressive overload as per policy without signs/symptoms or physical distress.   Resistance Training   Training Prescription Yes   Weight 3   Reps 10-15   Interval Training   Interval Training Yes   Equipment REL-XR   Comments L5-7 90-100 watts   Treadmill   MPH 2.2   Grade 2   Minutes 15   NuStep   Level 5   Watts 90   Minutes 20   REL-XR   Level 3   Watts 50   Minutes 20      Goals Met:  Independence with exercise equipment Exercise tolerated well Personal goals reviewed No report of cardiac concerns or symptoms Strength training completed today  Goals Unmet:  Not Applicable  Comments: Reviewed individualized exercise prescription and made increases per departmental policy. Exercise increases were discussed with the patient and they were able to perform the new work loads  without issue (no signs or symptoms).     Dr. Emily Filbert is Medical Director for [place] and [facility].

## 2015-09-06 NOTE — Patient Instructions (Signed)
Discharge Instructions  Patient Details  Name: Mandy Gilbert MRN: [id number] Date of Birth: 1940/09/05 Referring Provider:  Charolette Forward, MD   Number of Visits:   Reason for Discharge:  Patient reached a stable level of exercise. Patient independent in their exercise.  Smoking History:  History  Smoking status  . Never Smoker   Smokeless tobacco  . Not on file    Diagnosis:  S/P PTCA (percutaneous transluminal coronary angioplasty)  ST elevation myocardial infarction (STEMI), unspecified artery [facility], [place])  Initial Exercise Prescription:     Initial Exercise Prescription - 07/01/15 1400    Date of Initial Exercise Prescription   Date 07/01/15   Treadmill   MPH 2.5   Grade 0   Minutes 10   Recumbant Bike   Level 2   RPM 40   Watts 20   Minutes 10   NuStep   Level 2   Watts 40   Minutes 10   Arm Ergometer   Level 1   Watts 10   Minutes 10   Recumbant Elliptical   Level 2   RPM 40   Watts 20   Minutes 10   Elliptical   Level 1   Speed 3   Minutes 1   REL-XR   Level 2   Watts 50   Minutes 10   T5 Nustep   Level 1   Watts 15   Minutes 10   Biostep-RELP   Level 2   Watts 40   Minutes 10   Prescription Details   Frequency (times per week) 3   Duration Progress to 30 minutes of continuous aerobic without signs/symptoms of physical distress   Intensity   THRR REST +  30   Ratings of Perceived Exertion 11-15   Perceived Dyspnea 2-4   Progression   Progression Continue progressive overload as per policy without signs/symptoms or physical distress.   Resistance Training   Training Prescription Yes   Weight 2   Reps 10-15      Discharge Exercise Prescription (Final Exercise Prescription Changes):     Exercise Prescription Changes - 09/06/15 0800    Exercise Review   Progression Yes   Response to Exercise   Symptoms None   Duration Progress to 50 minutes of aerobic without signs/symptoms of physical distress   Intensity Rest + 30   Progression   Progression Continue progressive overload as per policy without signs/symptoms or physical distress.   Resistance Training   Training Prescription Yes   Weight 3   Reps 10-15   Interval Training   Interval Training Yes   Equipment REL-XR   Comments L5-7 90-100 watts   Treadmill   MPH 2.2   Grade 2   Minutes 15   NuStep   Level 5   Watts 90   Minutes 20   REL-XR   Level 3   Watts 50   Minutes 20   Home Exercise Plan   Plans to continue exercise at [facility] (comment)      Functional Capacity:     6 Minute Walk      07/01/15 1452 09/06/15 0952     6 Minute Walk   Phase Initial Discharge    Distance 1300 feet 1650 feet    Distance % Change  27 %    Walk Time 6 minutes 6 minutes    # of Rest Breaks  0    RPE 11 13    Symptoms No No    Resting  HR 87 bpm 98 bpm    Resting BP 128/84 mmHg 126/70 mmHg    Max Ex. HR 106 bpm 111 bpm    Max Ex. BP 142/74 mmHg 144/74 mmHg       Quality of Life:     Quality of Life - 09/06/15 1743    Quality of Life Scores   Health/Function Post 23.6 %   Socioeconomic Post 30 %   Psych/Spiritual Post 24.86 %   Family Post 28.8 %   GLOBAL Post 25.82 %      Personal Goals: Goals established at orientation with interventions provided to work toward goal.     Personal Goals and Risk Factors at Admission - 07/01/15 1535    Core Components/Risk Factors/Patient Goals on Admission   Sedentary Yes   Intervention (read-only) While in program, learn and follow the exercise prescription taught. Start at a low level workload and increase workload after able to maintain previous level for 30 minutes. Increase time before increasing intensity.   Diabetes No   Hypertension Yes   Goal Participant will see blood pressure controlled within the values of 140/11mm/Hg or within value directed by their physician.   Intervention (read-only) Provide nutrition & aerobic exercise along with prescribed medications to achieve BP  140/90 or less.   Lipids Yes   Goal Cholesterol controlled with medications as prescribed, with individualized exercise RX and with personalized nutrition plan. Value goals: LDL < 70mg , HDL > 40mg . Participant states understanding of desired cholesterol values and following prescriptions.   Intervention (read-only) Provide nutrition & aerobic exercise along with prescribed medications to achieve LDL 70mg , HDL >40mg .   Take Less Medication Yes   Intervention Learn your risk factors and begin the lifestyle modifications for risk factor control during your time in the program.   Understand more about Heart/Pulmonary Disease. Yes       Personal Goals Discharge:     Goals and Risk Factor Review - 08/16/15 0935    Core Components/Risk Factors/Patient Goals Review   Personal Goals Review Weight Management/Obesity;Hypertension;Lipids;Other   Review Mandy Gilbert is having blood work done on 08/18/15 and will update [place] after she receives her results on her cholesterol levels. Mandy Gilbert's BP has been in acceptable ranges during her time in cardiac rehab and continues to take her BP medication, there is no talk from her MD to come off of her BP meds. Mandy Gilbert would like to come off her blood thinner and will talk to her MD in April about this matter, Mandy Gilbert is within an acceptable body composition but would like to lose weight. She has lost a few pounds during the program but would still like to lose a few more. 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 She would like to increase her exercise work to gain strength and stamina to work this shift.       Nutrition & Weight - Outcomes:     Pre Biometrics - 07/01/15 1455    Pre Biometrics   Height 5' 5.5" (1.664 m)   Weight 143 lb (64.864 kg)   Waist Circumference 37 inches   Hip Circumference 42.5 inches   Waist to Hip Ratio 0.87 %   BMI (Calculated) 23.5         Post  Biometrics - 09/06/15 0953     Post  Biometrics   Height 5' 5.5" (1.664 m)   Weight  143 lb (64.864 kg)   Waist Circumference 28 inches   Hip Circumference 38.5 inches   Waist to Hip Ratio 0.73 %   BMI (Calculated) 23.5      Nutrition:     Nutrition Therapy & Goals - 07/01/15 1535    Nutrition Therapy   Drug/Food Interactions Statins/Certain Fruits   Intervention Plan   Intervention (read-only) Using nutrition plan and personal goals to gain a healthy nutrition lifestyle. Add exercise as prescribed.      Nutrition Discharge:     Nutrition Assessments - 09/06/15 1206    Rate Your Plate Scores   Post Score 63   Post Score % 70 %      Education Questionnaire Score:     Knowledge Questionnaire Score - 07/01/15 1532    Knowledge Questionnaire Score   Pre Score 24      Goals reviewed with patient; copy given to patient.

## 2015-09-08 ENCOUNTER — Encounter: Payer: Medicare Other | Admitting: *Deleted

## 2015-09-08 DIAGNOSIS — I213 ST elevation (STEMI) myocardial infarction of unspecified site: Secondary | ICD-10-CM

## 2015-09-08 DIAGNOSIS — Z9861 Coronary angioplasty status: Secondary | ICD-10-CM

## 2015-09-08 NOTE — Progress Notes (Signed)
Cardiac Individual Treatment Plan  Patient Details  Name: Mandy Gilbert MRN: [id number] Date of Birth: December 08, 1940 Referring Provider:  Charolette Forward, MD  Initial Encounter Date:       Cardiac Rehab from 07/01/2015 in [facility] Cardiac and Pulmonary Rehab   Date  07/01/15      Visit Diagnosis: S/P PTCA (percutaneous transluminal coronary angioplasty)  ST elevation myocardial infarction (STEMI), unspecified artery ([place])  Patient's Home Medications on Admission:  Current outpatient prescriptions:  .  aspirin EC 81 MG EC tablet, Take 1 tablet (81 mg total) by mouth daily., Disp: 30 tablet, Rfl: 3 .  atorvastatin (LIPITOR) 20 MG tablet, , Disp: , Rfl:  .  atorvastatin (LIPITOR) 80 MG tablet, Take 1 tablet (80 mg total) by mouth daily at 6 PM., Disp: 30 tablet, Rfl: 3 .  Cholecalciferol (VITAMIN D PO), Take 1,000 Units by mouth every morning. , Disp: , Rfl:  .  DILT-XR 240 MG 24 hr capsule, , Disp: , Rfl:  .  indapamide (LOZOL) 1.25 MG tablet, , Disp: , Rfl:  .  metoprolol succinate (TOPROL-XL) 100 MG 24 hr tablet, Take by mouth., Disp: , Rfl:  .  nitroGLYCERIN (NITROSTAT) 0.4 MG SL tablet, Place 1 tablet (0.4 mg total) under the tongue every 5 (five) minutes x 3 doses as needed for chest pain., Disp: 25 tablet, Rfl: 12 .  propranolol (INDERAL) 20 MG tablet, , Disp: , Rfl:  .  Psyllium (METAMUCIL PO), Take 3 capsules by mouth every morning., Disp: , Rfl:  .  ticagrelor (BRILINTA) 90 MG TABS tablet, Take 1 tablet (90 mg total) by mouth every 12 (twelve) hours., Disp: 60 tablet, Rfl: 11  Past Medical History: Past Medical History  Diagnosis Date  . Hypertension   . Constipation   . Cancer (HCC)     Skin cancer legs, basal cell.    Tobacco Use: History  Smoking status  . Never Smoker   Smokeless tobacco  . Not on file    Labs: Recent Review Flowsheet Data    Labs for ITP Cardiac and Pulmonary Rehab Latest Ref Rng 06/11/2015 06/11/2015 06/11/2015 06/11/2015 06/12/2015   Cholestrol 0 - 200  mg/dL - 156 - - 173   LDLCALC 0 - 99 mg/dL - 82 - - 90   HDL >40 mg/dL - 63 - - 74   Trlycerides <150 mg/dL - 56 - - 44   Hemoglobin A1c 4.8 - 5.6 % - - 5.8(H) 5.8(H) -   TCO2 0 - 100 mmol/L 14 - - - -       Exercise Target Goals:    Exercise Program Goal: Individual exercise prescription set with THRR, safety & activity barriers. Participant demonstrates ability to understand and report RPE using BORG scale, to self-measure pulse accurately, and to acknowledge the importance of the exercise prescription.  Exercise Prescription Goal: Starting with aerobic activity 30 plus minutes a day, 3 days per week for initial exercise prescription. Provide home exercise prescription and guidelines that participant acknowledges understanding prior to discharge.  Activity Barriers & Risk Stratification:     Activity Barriers & Cardiac Risk Stratification - 07/01/15 1531    Activity Barriers & Cardiac Risk Stratification   Activity Barriers Arthritis;Back Problems   Cardiac Risk Stratification Moderate      6 Minute Walk:     6 Minute Walk      07/01/15 1452 09/06/15 0952     6 Minute Walk   Phase Initial Discharge    Distance 1300  feet 1650 feet    Distance % Change  27 %    Walk Time 6 minutes 6 minutes    # of Rest Breaks  0    RPE 11 13    Symptoms No No    Resting HR 87 bpm 98 bpm    Resting BP 128/84 mmHg 126/70 mmHg    Max Ex. HR 106 bpm 111 bpm    Max Ex. BP 142/74 mmHg 144/74 mmHg       Initial Exercise Prescription:     Initial Exercise Prescription - 07/01/15 1400    Date of Initial Exercise Prescription   Date 07/01/15   Treadmill   MPH 2.5   Grade 0   Minutes 10   Recumbant Bike   Level 2   RPM 40   Watts 20   Minutes 10   NuStep   Level 2   Watts 40   Minutes 10   Arm Ergometer   Level 1   Watts 10   Minutes 10   Recumbant Elliptical   Level 2   RPM 40   Watts 20   Minutes 10   Elliptical   Level 1   Speed 3   Minutes 1   REL-XR    Level 2   Watts 50   Minutes 10   T5 Nustep   Level 1   Watts 15   Minutes 10   Biostep-RELP   Level 2   Watts 40   Minutes 10   Prescription Details   Frequency (times per week) 3   Duration Progress to 30 minutes of continuous aerobic without signs/symptoms of physical distress   Intensity   THRR REST +  30   Ratings of Perceived Exertion 11-15   Perceived Dyspnea 2-4   Progression   Progression Continue progressive overload as per policy without signs/symptoms or physical distress.   Resistance Training   Training Prescription Yes   Weight 2   Reps 10-15      Perform Capillary Blood Glucose checks as needed.  Exercise Prescription Changes:     Exercise Prescription Changes      07/19/15 1500 07/21/15 0900 07/30/15 0900 08/02/15 1000 08/11/15 0800   Exercise Review   Progression Yes Yes Yes Yes Yes   Response to Exercise   Blood Pressure (Admit) 128/88 mmHg       Blood Pressure (Exercise) 130/86 mmHg       Blood Pressure (Exit) 110/62 mmHg       Heart Rate (Admit) 96 bpm       Heart Rate (Exercise) 87 bpm       Heart Rate (Exit) 89 bpm       Rating of Perceived Exertion (Exercise) 13       Symptoms None None None None None   Comments Mandy Gilbert had a small set back with her blood pressure and heart rate and was out for a few sessions. She hopes to have more consistent attendance from now on. Despite only having 3 exercise sessions she is already feeling more comfortable on the equipment and has made some increases to her workloads. Added the TM to her routine and did very well! Added the TM to her routine and did very well! Reviewed individualized exercise prescription and made increases per departmental policy. Exercise increases were discussed with the patient and they were able to perform the new work loads without issue (no signs or symptoms).  Adjusted ex. rx. due to increase in stamina  and strength.    Duration Progress to 30 minutes of continuous aerobic without  signs/symptoms of physical distress Progress to 30 minutes of continuous aerobic without signs/symptoms of physical distress Progress to 30 minutes of continuous aerobic without signs/symptoms of physical distress Progress to 30 minutes of continuous aerobic without signs/symptoms of physical distress Progress to 30 minutes of continuous aerobic without signs/symptoms of physical distress   Intensity Rest + 30 Rest + 30 Rest + 30 Rest + 30 Rest + 30   Progression   Progression Continue progressive overload as per policy without signs/symptoms or physical distress. Continue progressive overload as per policy without signs/symptoms or physical distress. Continue progressive overload as per policy without signs/symptoms or physical distress. Continue progressive overload as per policy without signs/symptoms or physical distress. Continue progressive overload as per policy without signs/symptoms or physical distress.   Resistance Training   Training Prescription     Yes   Weight     3   Reps     10-15   Training Prescription (read-only) Yes Yes Yes Yes    Weight (read-only) 2 2 3 3     Reps (read-only) 10-15 10-15 10-15 10-15    Interval Training   Interval Training No No No No No   Treadmill   MPH     1.7   Grade     0   Minutes     15   MPH (read-only)  1.5 1.5 1.7    Grade (read-only)  0 0 0    Minutes (read-only)  10 10 15     NuStep   Level     5   Watts     90   Minutes     20   Level (read-only) 3 3 3 4     Watts (read-only) 45 45 45 50    Minutes (read-only) 20 20 20 20     REL-XR   Level     3   Watts     50   Minutes     20   Level (read-only) 3 3 3 3     Watts (read-only) 50 50 50 50    Minutes (read-only) 20 20 20 20       08/16/15 0900 08/18/15 0800 08/23/15 0900 09/06/15 0800     Exercise Review   Progression Yes Yes Yes Yes    Response to Exercise   Blood Pressure (Admit) 120/70 mmHg       Blood Pressure (Exercise) 144/68 mmHg       Blood Pressure (Exit) 120/78 mmHg        Heart Rate (Admit) 89 bpm       Heart Rate (Exercise) 120 bpm       Heart Rate (Exit) 85 bpm       Rating of Perceived Exertion (Exercise) 13       Symptoms None None None None    Comments Helped Lilyonna with her goal of having 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 We will start her on interval training and continuously progress her workloads in an effort to increase her stamina. Berkley will still maintain her 45 minutes of aerobic work.        Duration Progress to 30 minutes of continuous aerobic without signs/symptoms of physical distress Progress to 30 minutes of continuous aerobic without signs/symptoms of physical distress Progress to 50 minutes of aerobic without signs/symptoms of physical distress Progress to 50 minutes of aerobic without signs/symptoms of  physical distress    Intensity Rest + 30 Rest + 30 Rest + 30 Rest + 30    Progression   Progression Continue progressive overload as per policy without signs/symptoms or physical distress. Continue progressive overload as per policy without signs/symptoms or physical distress. Continue progressive overload as per policy without signs/symptoms or physical distress. Continue progressive overload as per policy without signs/symptoms or physical distress.    Resistance Training   Training Prescription Yes Yes Yes Yes    Weight 3 3 3 3     Reps 10-15 10-15 10-15 10-15    Interval Training   Interval Training No Yes Yes Yes    Equipment  REL-XR REL-XR REL-XR    Comments  L5-7 90-100 watts L5-7 90-100 watts L5-7 90-100 watts    Treadmill   MPH 2 2 2  2.2    Grade 3 3 3 2     Minutes 15 15 15 15     NuStep   Level 5 5 5 5     Watts 90 90 90 90    Minutes 20 20 20 20     REL-XR   Level 3 3 3 3     Watts 50 50 50 50    Minutes 20 20 20 20     Home Exercise Plan   Plans to continue exercise at    [facility] (comment)       Exercise Comments:     Exercise Comments      09/06/15 0846 09/08/15 0945          Exercise Comments Reviewed individualized exercise prescription and made increases per departmental policy. Exercise increases were discussed with the patient and they were able to perform the new work loads without issue (no signs or symptoms).  Karigan is about ready to finish 36/36 sessions of Cardiac Rehab. She is checking about exercise options to exercise with her husband. Her husband has a history of medical problems so I suggested the [employer] class.          Discharge Exercise Prescription (Final Exercise Prescription Changes):     Exercise Prescription Changes - 09/06/15 0800    Exercise Review   Progression Yes   Response to Exercise   Symptoms None   Duration Progress to 50 minutes of aerobic without signs/symptoms of physical distress   Intensity Rest + 30   Progression   Progression Continue progressive overload as per policy without signs/symptoms or physical distress.   Resistance Training   Training Prescription Yes   Weight 3   Reps 10-15   Interval Training   Interval Training Yes   Equipment REL-XR   Comments L5-7 90-100 watts   Treadmill   MPH 2.2   Grade 2   Minutes 15   NuStep   Level 5   Watts 90   Minutes 20   REL-XR   Level 3   Watts 50   Minutes 20   Home Exercise Plan   Plans to continue exercise at [employer] (comment)      Nutrition:  Target Goals: Understanding of nutrition guidelines, daily intake of sodium <1549m, cholesterol <2015m calories 30% from fat and 7% or less from saturated fats, daily to have 5 or more servings of fruits and vegetables.  Biometrics:     Pre Biometrics - 07/01/15 1455    Pre Biometrics   Height 5' 5.5" (1.664 m)   Weight 143 lb (64.864 kg)   Waist Circumference 37 inches   Hip Circumference 42.5  inches   Waist to Hip Ratio 0.87 %   BMI (Calculated) 23.5         Post Biometrics - 09/06/15 0953     Post  Biometrics   Height 5' 5.5" (1.664 m)   Weight 143 lb (64.864 kg)    Waist Circumference 28 inches   Hip Circumference 38.5 inches   Waist to Hip Ratio 0.73 %   BMI (Calculated) 23.5      Nutrition Therapy Plan and Nutrition Goals:     Nutrition Therapy & Goals - 07/01/15 1535    Nutrition Therapy   Drug/Food Interactions Statins/Certain Fruits   Intervention Plan   Intervention (read-only) Using nutrition plan and personal goals to gain a healthy nutrition lifestyle. Add exercise as prescribed.      Nutrition Discharge: Rate Your Plate Scores:     Nutrition Assessments - 09/06/15 1206    Rate Your Plate Scores   Post Score 63   Post Score % 70 %      Nutrition Goals Re-Evaluation:     Nutrition Goals Re-Evaluation      09/08/15 0945           Personal Goal #1 Re-Evaluation   Personal Goal #1 Deari feels she eats healthy already. She has a very good BMI.        Goal Progress Seen Yes       Comments Marietta said she met individually with the Cardiac Rehab registered dietician. Blimi said that went fine. Mikael said I already knew the joke "anything that tastes good to spit it out".          Psychosocial: Target Goals: Acknowledge presence or absence of depression, maximize coping skills, provide positive support system. Participant is able to verbalize types and ability to use techniques and skills needed for reducing stress and depression.  Initial Review & Psychosocial Screening:     Initial Psych Review & Screening - 07/01/15 [place]? Yes   Comments Valetta said she is busy all the time but expect for walking she has not done any formal exercise and is anxious what to expect. Lachae is very active volunteering at her church and meals on wheels etc. Very supportive husband. Very   Screening Interventions   Interventions Encouraged to exercise      Quality of Life Scores:     Quality of Life - 09/06/15 1743    Quality of Life Scores   Health/Function Post 23.6 %   Socioeconomic Post 30 %    Psych/Spiritual Post 24.86 %   Family Post 28.8 %   GLOBAL Post 25.82 %      PHQ-9:     Recent Review Flowsheet Data    Depression screen [facility] 2/9 09/06/2015 07/01/2015   Decreased Interest 0 1   Down, Depressed, Hopeless 0 1   PHQ - 2 Score 0 2   Altered sleeping - 1   Tired, decreased energy 1 2   Change in appetite 1 2   Feeling bad or failure about yourself  0 1   Trouble concentrating 1 1   Moving slowly or fidgety/restless 0 0   Suicidal thoughts 0 0   PHQ-9 Score - 9      Psychosocial Evaluation and Intervention:     Psychosocial Evaluation - 09/06/15 1029    Discharge Psychosocial Assessment & Intervention   Comments Counselor met with Ms. Slaymaker today for discharge evaluation.  She reports positive  benefits experienced from this program with increased stamina and strength and healthier eating.  As a result, she has noticed some continued weight loss since joining this program.  She and her spouse are planning to either join the [employer] or the [facility] and work out together following completion of this program.  Ms. Forsberg has never enjoyed exercising and this program has changed her attitude.  Counselor commended Ms. Triska for her commitment and hard work in completing this program.  She has been a pleasure to work with and her attitude has always been positive and she has practiced healthy stress management throughout.         Psychosocial Re-Evaluation:     Psychosocial Re-Evaluation      09/08/15 0948           Psychosocial Re-Evaluation   Interventions Stress management education;Relaxation education       Comments Renessa said Cardiac Rehab has helped her since exercise helps with stress and helps her to sleep better.           Vocational Rehabilitation: Provide vocational rehab assistance to qualifying candidates.   Vocational Rehab Evaluation & Intervention:     Vocational Rehab - 07/01/15 1533    Initial Vocational Rehab Evaluation &  Intervention   Assessment shows need for Vocational Rehabilitation No      Education: Education Goals: Education classes will be provided on a weekly basis, covering required topics. Participant will state understanding/return demonstration of topics presented.  Learning Barriers/Preferences:     Learning Barriers/Preferences - 07/01/15 1532    Learning Barriers/Preferences   Learning Barriers None   Learning Preferences Video;Written Material      Education Topics: General Nutrition Guidelines/Fats and Fiber: -Group instruction provided by verbal, written material, models and posters to present the general guidelines for heart healthy nutrition. Gives an explanation and review of dietary fats and fiber.          Cardiac Rehab from 09/08/2015 in [facility] Cardiac and Pulmonary Rehab   Date  09/06/15   Educator  CR   Instruction Review Code  2- meets goals/outcomes      Controlling Sodium/Reading Food Labels: -Group verbal and written material supporting the discussion of sodium use in heart healthy nutrition. Review and explanation with models, verbal and written materials for utilization of the food label.      Cardiac Rehab from 09/08/2015 in [facility] Cardiac and Pulmonary Rehab   Date  08/09/15   Educator  [facility]   Instruction Review Code  2- meets goals/outcomes      Exercise Physiology & Risk Factors: - Group verbal and written instruction with models to review the exercise physiology of the cardiovascular system and associated critical values. Details cardiovascular disease risk factors and the goals associated with each risk factor.      Cardiac Rehab from 09/08/2015 in [facility] Cardiac and Pulmonary Rehab   Date  08/02/15   Educator  RM   Instruction Review Code  2- meets goals/outcomes      Aerobic Exercise & Resistance Training: - Gives group verbal and written discussion on the health impact of inactivity. On the components of aerobic and resistive training programs and the  benefits of this training and how to safely progress through these programs.      Cardiac Rehab from 09/08/2015 in [facility] Cardiac and Pulmonary Rehab   Date  08/04/15   Educator  SW   Instruction Review Code  2- meets goals/outcomes  Flexibility, Balance, General Exercise Guidelines: - Provides group verbal and written instruction on the benefits of flexibility and balance training programs. Provides general exercise guidelines with specific guidelines to those with heart or lung disease. Demonstration and skill practice provided.      Cardiac Rehab from 09/08/2015 in [facility] Cardiac and Pulmonary Rehab   Date  08/09/15   Educator  [facility]   Instruction Review Code  2- meets goals/outcomes      Stress Management: - Provides group verbal and written instruction about the health risks of elevated stress, cause of high stress, and healthy ways to reduce stress.      Cardiac Rehab from 09/08/2015 in [facility] Cardiac and Pulmonary Rehab   Date  08/11/15   Educator  [facility]   Instruction Review Code  2- meets goals/outcomes      Depression: - Provides group verbal and written instruction on the correlation between heart/lung disease and depressed mood, treatment options, and the stigmas associated with seeking treatment.      Cardiac Rehab from 09/08/2015 in [facility] Cardiac and Pulmonary Rehab   Date  09/08/15   Educator  [facility]   Instruction Review Code  2- meets goals/outcomes      Anatomy & Physiology of the Heart: - Group verbal and written instruction and models provide basic cardiac anatomy and physiology, with the coronary electrical and arterial systems. Review of: AMI, Angina, Valve disease, Heart Failure, Cardiac Arrhythmia, Pacemakers, and the ICD.      Cardiac Rehab from 09/08/2015 in [facility] Cardiac and Pulmonary Rehab   Date  08/16/15   Educator  SB   Instruction Review Code  2- meets goals/outcomes      Cardiac Procedures: - Group verbal and written instruction and models to describe the testing  methods done to diagnose heart disease. Reviews the outcomes of the test results. Describes the treatment choices: Medical Management, Angioplasty, or Coronary Bypass Surgery.      Cardiac Rehab from 09/08/2015 in [facility] Cardiac and Pulmonary Rehab   Date  08/23/15   Educator  SB   Instruction Review Code  2- meets goals/outcomes      Cardiac Medications: - Group verbal and written instruction to review commonly prescribed medications for heart disease. Reviews the medication, class of the drug, and side effects. Includes the steps to properly store meds and maintain the prescription regimen.      Cardiac Rehab from 09/08/2015 in [facility] Cardiac and Pulmonary Rehab   Date  08/25/15   Educator  SB   Instruction Review Code  2- meets goals/outcomes      Go Sex-Intimacy & Heart Disease, Get SMART - Goal Setting: - Group verbal and written instruction through game format to discuss heart disease and the return to sexual intimacy. Provides group verbal and written material to discuss and apply goal setting through the application of the S.M.A.R.T. Method.      Cardiac Rehab from 09/08/2015 in [facility] Cardiac and Pulmonary Rehab   Date  08/23/15   Educator  SB   Instruction Review Code  2- meets goals/outcomes      Other Matters of the Heart: - Provides group verbal, written materials and models to describe Heart Failure, Angina, Valve Disease, and Diabetes in the realm of heart disease. Includes description of the disease process and treatment options available to the cardiac patient.   Exercise & Equipment Safety: - Individual verbal instruction and demonstration of equipment use and safety with use of the equipment.  Cardiac Rehab from 09/08/2015 in [facility] Cardiac and Pulmonary Rehab   Date  07/01/15   Educator  C. EnterkinRN   Instruction Review Code  1- partially meets, needs review/practice      Infection Prevention: - Provides verbal and written material to individual with discussion of  infection control including proper hand washing and proper equipment cleaning during exercise session.      Cardiac Rehab from 09/08/2015 in [facility] Cardiac and Pulmonary Rehab   Date  07/01/15   Educator  C. EnterkinRN   Instruction Review Code  2- meets goals/outcomes      Falls Prevention: - Provides verbal and written material to individual with discussion of falls prevention and safety.      Cardiac Rehab from 09/08/2015 in [facility] Cardiac and Pulmonary Rehab   Date  07/01/15   Educator  C. Mayra Jolliffe, RN   Instruction Review Code  2- meets goals/outcomes      Diabetes: - Individual verbal and written instruction to review signs/symptoms of diabetes, desired ranges of glucose level fasting, after meals and with exercise. Advice that pre and post exercise glucose checks will be done for 3 sessions at entry of program.    Knowledge Questionnaire Score:     Knowledge Questionnaire Score - 07/01/15 1532    Knowledge Questionnaire Score   Pre Score 24      Core Components/Risk Factors/Patient Goals at Admission:     Personal Goals and Risk Factors at Admission - 07/01/15 1535    Core Components/Risk Factors/Patient Goals on Admission   Sedentary Yes   Intervention (read-only) While in program, learn and follow the exercise prescription taught. Start at a low level workload and increase workload after able to maintain previous level for 30 minutes. Increase time before increasing intensity.   Diabetes No   Hypertension Yes   Goal Participant will see blood pressure controlled within the values of 140/54m/Hg or within value directed by their physician.   Intervention (read-only) Provide nutrition & aerobic exercise along with prescribed medications to achieve BP 140/90 or less.   Lipids Yes   Goal Cholesterol controlled with medications as prescribed, with individualized exercise RX and with personalized nutrition plan. Value goals: LDL < 764m HDL > 4058mParticipant states  understanding of desired cholesterol values and following prescriptions.   Intervention (read-only) Provide nutrition & aerobic exercise along with prescribed medications to achieve LDL <36m27mDL >40mg73mTake Less Medication Yes   Intervention Learn your risk factors and begin the lifestyle modifications for risk factor control during your time in the program.   Understand more about Heart/Pulmonary Disease. Yes      Core Components/Risk Factors/Patient Goals Review:      Goals and Risk Factor Review      07/19/15 1533 08/16/15 0935 09/08/15 0948       Core Components/Risk Factors/Patient Goals Review   Personal Goals Review Increase Aerobic Exercise and Physical Activity Weight Management/Obesity;Hypertension;Lipids;Other      Review  Amiree iCathernaving blood work done on 08/18/15 and will update us afKorear she receives her results on her cholesterol levels. Addelyn's BP has been in acceptable ranges during her time in cardiac rehab and continues to take her BP medication, there is no talk from her MD to come off of her BP meds. Chiyeko wShenequad like to come off her blood thinner and will talk to her MD in April about this matter, Cylee iPeytonithin an acceptable body composition but would like to lose weight. She  has lost a few pounds during the program but would still like to lose a few more. 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 She would like to increase her exercise work to gain strength and stamina to work this shift.  Coti is about ready to finish 36/36 sessions of Cardiac Rehab. She is checking about exercise options to exercise with her husband. Her husband has a history of medical problems so I suggested the [facility] class. Mabelle blood pressure is very stable.      Increase Aerobic Exercise and Physical Activity (read-only)   Goals Progress/Improvement seen  Yes       Comments Raynesha can  exercise continuously for 30 minutes and in the next few weeks we hope to max that out at 45-50 minutes. She has increased her workloads on the NS and XR machine and is gaining strength and stamina. She is more comfortable on the machines and overall is very happy with the program and says she can see the exercise working. With more consistent attendance we hope to see further increases for Brandee in the coming weeks and a translation of how this is improving her ADLs.           Core Components/Risk Factors/Patient Goals at Discharge (Final Review):      Goals and Risk Factor Review - 09/08/15 0948    Core Components/Risk Factors/Patient Goals Review   Review Howard is about ready to finish 36/36 sessions of Cardiac Rehab. She is checking about exercise options to exercise with her husband. Her husband has a history of medical problems so I suggested the [facility] class. Arita blood pressure is very stable.       ITP Comments:     ITP Comments      07/12/15 1038 07/12/15 1041 07/12/15 1044 07/26/15 0822 07/27/15 0833   ITP Comments Kaicee came to class todat stating she was not her normal self, She woke up last night not feeling right but no specfic concern. On arrival today she was noted to be in bigeminy rhythm. A new rhythm  to [place].  CAlled Dr Terrence Dupont and spoke to him aabout this rhythm, he spoke to Imaya and advised her to increase her metoprolol to 3 times a day. He also will see her tomorrow at 2 pm in his office.  She was advised by Dr Terrence Dupont and myself to call 911 if she had increasing symptoms or concerns before her appointment. She was told to have her husband drive her to the appointment.  This note is for 07/12/2015 arrival to the program. Harmon Pier wsa sent home to rest and relax until her appointment on 2/7 See ITP note placed in the 2/3 encounter for concerns on arrival today. No exercise today, education only, had to leave early due to appointment.  30 day review. Continue with ITP      07/27/15 0834 08/04/15 0723 08/26/15 1318 09/08/15 0944     ITP Comments Has attended a few sessions since orientation 30 day review   Continue with ITP 30 Day Review. Continue with the ITP. Leea is about ready to finish 36/36 sessions of Cardiac Rehab. She is checking about exercise options to exercise with her husband. Her husband has a history of medical problems so I suggested the [facility] class.        Comments:

## 2015-09-08 NOTE — Progress Notes (Signed)
Cardiac Individual Treatment Plan  Patient Details  Name: Mandy Gilbert MRN: [id number] Date of Birth: 1940-09-26 Referring Provider:  Charolette Forward, MD  Initial Encounter Date:       Cardiac Rehab from 07/01/2015 in [facility] Cardiac and Pulmonary Rehab   Date  07/01/15      Visit Diagnosis: S/P PTCA (percutaneous transluminal coronary angioplasty)  ST elevation myocardial infarction (STEMI), unspecified artery ([place])  Patient's Home Medications on Admission:  Current outpatient prescriptions:  .  aspirin EC 81 MG EC tablet, Take 1 tablet (81 mg total) by mouth daily., Disp: 30 tablet, Rfl: 3 .  atorvastatin (LIPITOR) 20 MG tablet, , Disp: , Rfl:  .  atorvastatin (LIPITOR) 80 MG tablet, Take 1 tablet (80 mg total) by mouth daily at 6 PM., Disp: 30 tablet, Rfl: 3 .  Cholecalciferol (VITAMIN D PO), Take 1,000 Units by mouth every morning. , Disp: , Rfl:  .  DILT-XR 240 MG 24 hr capsule, , Disp: , Rfl:  .  indapamide (LOZOL) 1.25 MG tablet, , Disp: , Rfl:  .  metoprolol succinate (TOPROL-XL) 100 MG 24 hr tablet, Take by mouth., Disp: , Rfl:  .  nitroGLYCERIN (NITROSTAT) 0.4 MG SL tablet, Place 1 tablet (0.4 mg total) under the tongue every 5 (five) minutes x 3 doses as needed for chest pain., Disp: 25 tablet, Rfl: 12 .  propranolol (INDERAL) 20 MG tablet, , Disp: , Rfl:  .  Psyllium (METAMUCIL PO), Take 3 capsules by mouth every morning., Disp: , Rfl:  .  ticagrelor (BRILINTA) 90 MG TABS tablet, Take 1 tablet (90 mg total) by mouth every 12 (twelve) hours., Disp: 60 tablet, Rfl: 11  Past Medical History: Past Medical History  Diagnosis Date  . Hypertension   . Constipation   . Cancer (HCC)     Skin cancer legs, basal cell.    Tobacco Use: History  Smoking status  . Never Smoker   Smokeless tobacco  . Not on file    Labs: Recent Review Flowsheet Data    Labs for ITP Cardiac and Pulmonary Rehab Latest Ref Rng 06/11/2015 06/11/2015 06/11/2015 06/11/2015 06/12/2015   Cholestrol 0 - 200  mg/dL - 156 - - 173   LDLCALC 0 - 99 mg/dL - 82 - - 90   HDL >40 mg/dL - 63 - - 74   Trlycerides <150 mg/dL - 56 - - 44   Hemoglobin A1c 4.8 - 5.6 % - - 5.8(H) 5.8(H) -   TCO2 0 - 100 mmol/L 14 - - - -       Exercise Target Goals:    Exercise Program Goal: Individual exercise prescription set with THRR, safety & activity barriers. Participant demonstrates ability to understand and report RPE using BORG scale, to self-measure pulse accurately, and to acknowledge the importance of the exercise prescription.  Exercise Prescription Goal: Starting with aerobic activity 30 plus minutes a day, 3 days per week for initial exercise prescription. Provide home exercise prescription and guidelines that participant acknowledges understanding prior to discharge.  Activity Barriers & Risk Stratification:     Activity Barriers & Cardiac Risk Stratification - 07/01/15 1531    Activity Barriers & Cardiac Risk Stratification   Activity Barriers Arthritis;Back Problems   Cardiac Risk Stratification Moderate      6 Minute Walk:     6 Minute Walk      07/01/15 1452 09/06/15 0952     6 Minute Walk   Phase Initial Discharge    Distance 1300  feet 1650 feet    Distance % Change  27 %    Walk Time 6 minutes 6 minutes    # of Rest Breaks  0    RPE 11 13    Symptoms No No    Resting HR 87 bpm 98 bpm    Resting BP 128/84 mmHg 126/70 mmHg    Max Ex. HR 106 bpm 111 bpm    Max Ex. BP 142/74 mmHg 144/74 mmHg       Initial Exercise Prescription:     Initial Exercise Prescription - 07/01/15 1400    Date of Initial Exercise Prescription   Date 07/01/15   Treadmill   MPH 2.5   Grade 0   Minutes 10   Recumbant Bike   Level 2   RPM 40   Watts 20   Minutes 10   NuStep   Level 2   Watts 40   Minutes 10   Arm Ergometer   Level 1   Watts 10   Minutes 10   Recumbant Elliptical   Level 2   RPM 40   Watts 20   Minutes 10   Elliptical   Level 1   Speed 3   Minutes 1   REL-XR    Level 2   Watts 50   Minutes 10   T5 Nustep   Level 1   Watts 15   Minutes 10   Biostep-RELP   Level 2   Watts 40   Minutes 10   Prescription Details   Frequency (times per week) 3   Duration Progress to 30 minutes of continuous aerobic without signs/symptoms of physical distress   Intensity   THRR REST +  30   Ratings of Perceived Exertion 11-15   Perceived Dyspnea 2-4   Progression   Progression Continue progressive overload as per policy without signs/symptoms or physical distress.   Resistance Training   Training Prescription Yes   Weight 2   Reps 10-15      Perform Capillary Blood Glucose checks as needed.  Exercise Prescription Changes:     Exercise Prescription Changes      07/19/15 1500 07/21/15 0900 07/30/15 0900 08/02/15 1000 08/11/15 0800   Exercise Review   Progression Yes Yes Yes Yes Yes   Response to Exercise   Blood Pressure (Admit) 128/88 mmHg       Blood Pressure (Exercise) 130/86 mmHg       Blood Pressure (Exit) 110/62 mmHg       Heart Rate (Admit) 96 bpm       Heart Rate (Exercise) 87 bpm       Heart Rate (Exit) 89 bpm       Rating of Perceived Exertion (Exercise) 13       Symptoms None None None None None   Comments Mandy Gilbert had a small set back with her blood pressure and heart rate and was out for a few sessions. She hopes to have more consistent attendance from now on. Despite only having 3 exercise sessions she is already feeling more comfortable on the equipment and has made some increases to her workloads. Added the TM to her routine and did very well! Added the TM to her routine and did very well! Reviewed individualized exercise prescription and made increases per departmental policy. Exercise increases were discussed with the patient and they were able to perform the new work loads without issue (no signs or symptoms).  Adjusted ex. rx. due to increase in stamina  and strength.    Duration Progress to 30 minutes of continuous aerobic without  signs/symptoms of physical distress Progress to 30 minutes of continuous aerobic without signs/symptoms of physical distress Progress to 30 minutes of continuous aerobic without signs/symptoms of physical distress Progress to 30 minutes of continuous aerobic without signs/symptoms of physical distress Progress to 30 minutes of continuous aerobic without signs/symptoms of physical distress   Intensity Rest + 30 Rest + 30 Rest + 30 Rest + 30 Rest + 30   Progression   Progression Continue progressive overload as per policy without signs/symptoms or physical distress. Continue progressive overload as per policy without signs/symptoms or physical distress. Continue progressive overload as per policy without signs/symptoms or physical distress. Continue progressive overload as per policy without signs/symptoms or physical distress. Continue progressive overload as per policy without signs/symptoms or physical distress.   Resistance Training   Training Prescription     Yes   Weight     3   Reps     10-15   Training Prescription (read-only) Yes Yes Yes Yes    Weight (read-only) 2 2 3 3     Reps (read-only) 10-15 10-15 10-15 10-15    Interval Training   Interval Training No No No No No   Treadmill   MPH     1.7   Grade     0   Minutes     15   MPH (read-only)  1.5 1.5 1.7    Grade (read-only)  0 0 0    Minutes (read-only)  10 10 15     NuStep   Level     5   Watts     90   Minutes     20   Level (read-only) 3 3 3 4     Watts (read-only) 45 45 45 50    Minutes (read-only) 20 20 20 20     REL-XR   Level     3   Watts     50   Minutes     20   Level (read-only) 3 3 3 3     Watts (read-only) 50 50 50 50    Minutes (read-only) 20 20 20 20       08/16/15 0900 08/18/15 0800 08/23/15 0900 09/06/15 0800     Exercise Review   Progression Yes Yes Yes Yes    Response to Exercise   Blood Pressure (Admit) 120/70 mmHg       Blood Pressure (Exercise) 144/68 mmHg       Blood Pressure (Exit) 120/78 mmHg        Heart Rate (Admit) 89 bpm       Heart Rate (Exercise) 120 bpm       Heart Rate (Exit) 85 bpm       Rating of Perceived Exertion (Exercise) 13       Symptoms None None None None    Comments Helped Tishanna with her goal of having 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 We will start her on interval training and continuously progress her workloads in an effort to increase her stamina. Arabella will still maintain her 45 minutes of aerobic work.        Duration Progress to 30 minutes of continuous aerobic without signs/symptoms of physical distress Progress to 30 minutes of continuous aerobic without signs/symptoms of physical distress Progress to 50 minutes of aerobic without signs/symptoms of physical distress Progress to 50 minutes of aerobic without signs/symptoms of  physical distress    Intensity Rest + 30 Rest + 30 Rest + 30 Rest + 30    Progression   Progression Continue progressive overload as per policy without signs/symptoms or physical distress. Continue progressive overload as per policy without signs/symptoms or physical distress. Continue progressive overload as per policy without signs/symptoms or physical distress. Continue progressive overload as per policy without signs/symptoms or physical distress.    Resistance Training   Training Prescription Yes Yes Yes Yes    Weight 3 3 3 3     Reps 10-15 10-15 10-15 10-15    Interval Training   Interval Training No Yes Yes Yes    Equipment  REL-XR REL-XR REL-XR    Comments  L5-7 90-100 watts L5-7 90-100 watts L5-7 90-100 watts    Treadmill   MPH 2 2 2  2.2    Grade 3 3 3 2     Minutes 15 15 15 15     NuStep   Level 5 5 5 5     Watts 90 90 90 90    Minutes 20 20 20 20     REL-XR   Level 3 3 3 3     Watts 50 50 50 50    Minutes 20 20 20 20     Home Exercise Plan   Plans to continue exercise at    [facility] (comment)       Exercise Comments:     Exercise Comments      09/06/15 0846           Exercise  Comments Reviewed individualized exercise prescription and made increases per departmental policy. Exercise increases were discussed with the patient and they were able to perform the new work loads without issue (no signs or symptoms).           Discharge Exercise Prescription (Final Exercise Prescription Changes):     Exercise Prescription Changes - 09/06/15 0800    Exercise Review   Progression Yes   Response to Exercise   Symptoms None   Duration Progress to 50 minutes of aerobic without signs/symptoms of physical distress   Intensity Rest + 30   Progression   Progression Continue progressive overload as per policy without signs/symptoms or physical distress.   Resistance Training   Training Prescription Yes   Weight 3   Reps 10-15   Interval Training   Interval Training Yes   Equipment REL-XR   Comments L5-7 90-100 watts   Treadmill   MPH 2.2   Grade 2   Minutes 15   NuStep   Level 5   Watts 90   Minutes 20   REL-XR   Level 3   Watts 50   Minutes 20   Home Exercise Plan   Plans to continue exercise at [employer] (comment)      Nutrition:  Target Goals: Understanding of nutrition guidelines, daily intake of sodium <1536m, cholesterol <2034m calories 30% from fat and 7% or less from saturated fats, daily to have 5 or more servings of fruits and vegetables.  Biometrics:     Pre Biometrics - 07/01/15 1455    Pre Biometrics   Height 5' 5.5" (1.664 m)   Weight 143 lb (64.864 kg)   Waist Circumference 37 inches   Hip Circumference 42.5 inches   Waist to Hip Ratio 0.87 %   BMI (Calculated) 23.5         Post Biometrics - 09/06/15 0953     Post  Biometrics   Height 5' 5.5" (  1.664 m)   Weight 143 lb (64.864 kg)   Waist Circumference 28 inches   Hip Circumference 38.5 inches   Waist to Hip Ratio 0.73 %   BMI (Calculated) 23.5      Nutrition Therapy Plan and Nutrition Goals:     Nutrition Therapy & Goals - 07/01/15 1535    Nutrition Therapy    Drug/Food Interactions Statins/Certain Fruits   Intervention Plan   Intervention (read-only) Using nutrition plan and personal goals to gain a healthy nutrition lifestyle. Add exercise as prescribed.      Nutrition Discharge: Rate Your Plate Scores:     Nutrition Assessments - 09/06/15 1206    Rate Your Plate Scores   Post Score 63   Post Score % 70 %      Nutrition Goals Re-Evaluation:   Psychosocial: Target Goals: Acknowledge presence or absence of depression, maximize coping skills, provide positive support system. Participant is able to verbalize types and ability to use techniques and skills needed for reducing stress and depression.  Initial Review & Psychosocial Screening:     Initial Psych Review & Screening - 07/01/15 [place]? Yes   Comments Makeila said she is busy all the time but expect for walking she has not done any formal exercise and is anxious what to expect. Akeya is very active volunteering at her church and meals on wheels etc. Very supportive husband. Very   Screening Interventions   Interventions Encouraged to exercise      Quality of Life Scores:     Quality of Life - 09/06/15 1743    Quality of Life Scores   Health/Function Post 23.6 %   Socioeconomic Post 30 %   Psych/Spiritual Post 24.86 %   Family Post 28.8 %   GLOBAL Post 25.82 %      PHQ-9:     Recent Review Flowsheet Data    Depression screen [facility] 2/9 09/06/2015 07/01/2015   Decreased Interest 0 1   Down, Depressed, Hopeless 0 1   PHQ - 2 Score 0 2   Altered sleeping - 1   Tired, decreased energy 1 2   Change in appetite 1 2   Feeling bad or failure about yourself  0 1   Trouble concentrating 1 1   Moving slowly or fidgety/restless 0 0   Suicidal thoughts 0 0   PHQ-9 Score - 9      Psychosocial Evaluation and Intervention:     Psychosocial Evaluation - 09/06/15 1029    Discharge Psychosocial Assessment & Intervention   Comments  Counselor met with Ms. Frison today for discharge evaluation.  She reports positive benefits experienced from this program with increased stamina and strength and healthier eating.  As a result, she has noticed some continued weight loss since joining this program.  She and her spouse are planning to either join the [employer] or the [employer] and work out together following completion of this program.  Ms. Wormley has never enjoyed exercising and this program has changed her attitude.  Counselor commended Ms. Bayona for her commitment and hard work in completing this program.  She has been a pleasure to work with and her attitude has always been positive and she has practiced healthy stress management throughout.         Psychosocial Re-Evaluation:   Vocational Rehabilitation: Provide vocational rehab assistance to qualifying candidates.   Vocational Rehab Evaluation & Intervention:     Vocational  Rehab - 07/01/15 1533    Initial Vocational Rehab Evaluation & Intervention   Assessment shows need for Vocational Rehabilitation No      Education: Education Goals: Education classes will be provided on a weekly basis, covering required topics. Participant will state understanding/return demonstration of topics presented.  Learning Barriers/Preferences:     Learning Barriers/Preferences - 07/01/15 1532    Learning Barriers/Preferences   Learning Barriers None   Learning Preferences Video;Written Material      Education Topics: General Nutrition Guidelines/Fats and Fiber: -Group instruction provided by verbal, written material, models and posters to present the general guidelines for heart healthy nutrition. Gives an explanation and review of dietary fats and fiber.          Cardiac Rehab from 09/08/2015 in [facility] Cardiac and Pulmonary Rehab   Date  09/06/15   Educator  CR   Instruction Review Code  2- meets goals/outcomes      Controlling Sodium/Reading Food Labels: -Group  verbal and written material supporting the discussion of sodium use in heart healthy nutrition. Review and explanation with models, verbal and written materials for utilization of the food label.      Cardiac Rehab from 09/08/2015 in [facility] Cardiac and Pulmonary Rehab   Date  08/09/15   Educator  [facility]   Instruction Review Code  2- meets goals/outcomes      Exercise Physiology & Risk Factors: - Group verbal and written instruction with models to review the exercise physiology of the cardiovascular system and associated critical values. Details cardiovascular disease risk factors and the goals associated with each risk factor.      Cardiac Rehab from 09/08/2015 in [facility] Cardiac and Pulmonary Rehab   Date  08/02/15   Educator  RM   Instruction Review Code  2- meets goals/outcomes      Aerobic Exercise & Resistance Training: - Gives group verbal and written discussion on the health impact of inactivity. On the components of aerobic and resistive training programs and the benefits of this training and how to safely progress through these programs.      Cardiac Rehab from 09/08/2015 in [facility] Cardiac and Pulmonary Rehab   Date  08/04/15   Educator  SW   Instruction Review Code  2- meets goals/outcomes      Flexibility, Balance, General Exercise Guidelines: - Provides group verbal and written instruction on the benefits of flexibility and balance training programs. Provides general exercise guidelines with specific guidelines to those with heart or lung disease. Demonstration and skill practice provided.      Cardiac Rehab from 09/08/2015 in [facility] Cardiac and Pulmonary Rehab   Date  08/09/15   Educator  [facility]   Instruction Review Code  2- meets goals/outcomes      Stress Management: - Provides group verbal and written instruction about the health risks of elevated stress, cause of high stress, and healthy ways to reduce stress.      Cardiac Rehab from 09/08/2015 in [facility] Cardiac and Pulmonary Rehab   Date   08/11/15   Educator  [facility]   Instruction Review Code  2- meets goals/outcomes      Depression: - Provides group verbal and written instruction on the correlation between heart/lung disease and depressed mood, treatment options, and the stigmas associated with seeking treatment.      Cardiac Rehab from 09/08/2015 in [facility] Cardiac and Pulmonary Rehab   Date  09/08/15   Educator  [facility]   Instruction Review Code  2- meets goals/outcomes  Anatomy & Physiology of the Heart: - Group verbal and written instruction and models provide basic cardiac anatomy and physiology, with the coronary electrical and arterial systems. Review of: AMI, Angina, Valve disease, Heart Failure, Cardiac Arrhythmia, Pacemakers, and the ICD.      Cardiac Rehab from 09/08/2015 in [facility] Cardiac and Pulmonary Rehab   Date  08/16/15   Educator  SB   Instruction Review Code  2- meets goals/outcomes      Cardiac Procedures: - Group verbal and written instruction and models to describe the testing methods done to diagnose heart disease. Reviews the outcomes of the test results. Describes the treatment choices: Medical Management, Angioplasty, or Coronary Bypass Surgery.      Cardiac Rehab from 09/08/2015 in [facility] Cardiac and Pulmonary Rehab   Date  08/23/15   Educator  SB   Instruction Review Code  2- meets goals/outcomes      Cardiac Medications: - Group verbal and written instruction to review commonly prescribed medications for heart disease. Reviews the medication, class of the drug, and side effects. Includes the steps to properly store meds and maintain the prescription regimen.      Cardiac Rehab from 09/08/2015 in [facility] Cardiac and Pulmonary Rehab   Date  08/25/15   Educator  SB   Instruction Review Code  2- meets goals/outcomes      Go Sex-Intimacy & Heart Disease, Get SMART - Goal Setting: - Group verbal and written instruction through game format to discuss heart disease and the return to sexual intimacy. Provides  group verbal and written material to discuss and apply goal setting through the application of the S.M.A.R.T. Method.      Cardiac Rehab from 09/08/2015 in [facility] Cardiac and Pulmonary Rehab   Date  08/23/15   Educator  SB   Instruction Review Code  2- meets goals/outcomes      Other Matters of the Heart: - Provides group verbal, written materials and models to describe Heart Failure, Angina, Valve Disease, and Diabetes in the realm of heart disease. Includes description of the disease process and treatment options available to the cardiac patient.   Exercise & Equipment Safety: - Individual verbal instruction and demonstration of equipment use and safety with use of the equipment.      Cardiac Rehab from 09/08/2015 in [facility] Cardiac and Pulmonary Rehab   Date  07/01/15   Educator  C. EnterkinRN   Instruction Review Code  1- partially meets, needs review/practice      Infection Prevention: - Provides verbal and written material to individual with discussion of infection control including proper hand washing and proper equipment cleaning during exercise session.      Cardiac Rehab from 09/08/2015 in [facility] Cardiac and Pulmonary Rehab   Date  07/01/15   Educator  C. EnterkinRN   Instruction Review Code  2- meets goals/outcomes      Falls Prevention: - Provides verbal and written material to individual with discussion of falls prevention and safety.      Cardiac Rehab from 09/08/2015 in [facility] Cardiac and Pulmonary Rehab   Date  07/01/15   Educator  C. Maleya Leever, RN   Instruction Review Code  2- meets goals/outcomes      Diabetes: - Individual verbal and written instruction to review signs/symptoms of diabetes, desired ranges of glucose level fasting, after meals and with exercise. Advice that pre and post exercise glucose checks will be done for 3 sessions at entry of program.    Knowledge Questionnaire Score:  Knowledge Questionnaire Score - 07/01/15 1532    Knowledge Questionnaire  Score   Pre Score 24      Core Components/Risk Factors/Patient Goals at Admission:     Personal Goals and Risk Factors at Admission - 07/01/15 1535    Core Components/Risk Factors/Patient Goals on Admission   Sedentary Yes   Intervention (read-only) While in program, learn and follow the exercise prescription taught. Start at a low level workload and increase workload after able to maintain previous level for 30 minutes. Increase time before increasing intensity.   Diabetes No   Hypertension Yes   Goal Participant will see blood pressure controlled within the values of 140/80m/Hg or within value directed by their physician.   Intervention (read-only) Provide nutrition & aerobic exercise along with prescribed medications to achieve BP 140/90 or less.   Lipids Yes   Goal Cholesterol controlled with medications as prescribed, with individualized exercise RX and with personalized nutrition plan. Value goals: LDL < 771m HDL > 4010mParticipant states understanding of desired cholesterol values and following prescriptions.   Intervention (read-only) Provide nutrition & aerobic exercise along with prescribed medications to achieve LDL <44m95mDL >40mg41mTake Less Medication Yes   Intervention Learn your risk factors and begin the lifestyle modifications for risk factor control during your time in the program.   Understand more about Heart/Pulmonary Disease. Yes      Core Components/Risk Factors/Patient Goals Review:      Goals and Risk Factor Review      07/19/15 1533 08/16/15 0935         Core Components/Risk Factors/Patient Goals Review   Personal Goals Review Increase Aerobic Exercise and Physical Activity Weight Management/Obesity;Hypertension;Lipids;Other      Review  Zyla iArmineaving blood work done on 08/18/15 and will update us afKorear she receives her results on her cholesterol levels. Latarra's BP has been in acceptable ranges during her time in cardiac rehab and continues to take her  BP medication, there is no talk from her MD to come off of her BP meds. Danique wKineshad like to come off her blood thinner and will talk to her MD in April about this matter, Oliwia iMadelinithin an acceptable body composition but would like to lose weight. She has lost a few pounds during the program but would still like to lose a few more. 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 She would like to increase her exercise work to gain strength and stamina to work this shift.       Increase Aerobic Exercise and Physical Activity (read-only)   Goals Progress/Improvement seen  Yes       Comments Kaileen cJeneferexercise continuously for 30 minutes and in the next few weeks we hope to max that out at 45-50 minutes. She has increased her workloads on the NS and XR machine and is gaining strength and stamina. She is more comfortable on the machines and overall is very happy with the program and says she can see the exercise working. With more consistent attendance we hope to see further increases for Shritha iJaydinhe coming weeks and a translation of how this is improving her ADLs.           Core Components/Risk Factors/Patient Goals at Discharge (Final Review):      Goals and Risk Factor Review - 08/16/15 0935    Core Components/Risk  Factors/Patient Goals Review   Personal Goals Review Weight Management/Obesity;Hypertension;Lipids;Other   Review Nell is having blood work done on 08/18/15 and will update [place] after she receives her results on her cholesterol levels. Jorgia's BP has been in acceptable ranges during her time in cardiac rehab and continues to take her BP medication, there is no talk from her MD to come off of her BP meds. Sylvie would like to come off her blood thinner and will talk to her MD in April about this matter, Alicja is within an acceptable body composition but would like to lose weight. She has lost a few pounds during the  program but would still like to lose a few more. 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 She would like to increase her exercise work to gain strength and stamina to work this shift.       ITP Comments:     ITP Comments      07/12/15 1038 07/12/15 1041 07/12/15 1044 07/26/15 0822 07/27/15 0833   ITP Comments Kielee came to class todat stating she was not her normal self, She woke up last night not feeling right but no specfic concern. On arrival today she was noted to be in bigeminy rhythm. A new rhythm  to [place].  CAlled Dr Terrence Dupont and spoke to him aabout this rhythm, he spoke to Laranda and advised her to increase her metoprolol to 3 times a day. He also will see her tomorrow at 2 pm in his office.  She was advised by Dr Terrence Dupont and myself to call 911 if she had increasing symptoms or concerns before her appointment. She was told to have her husband drive her to the appointment.  This note is for 07/12/2015 arrival to the program. Harmon Pier wsa sent home to rest and relax until her appointment on 2/7 See ITP note placed in the 2/3 encounter for concerns on arrival today. No exercise today, education only, had to leave early due to appointment.  30 day review. Continue with ITP     07/27/15 0834 08/04/15 0723 08/26/15 1318       ITP Comments Has attended a few sessions since orientation 30 day review   Continue with ITP 30 Day Review. Continue with the ITP.        Comments:

## 2015-09-08 NOTE — Progress Notes (Signed)
Daily Session Note  Patient Details  Name: Mandy Gilbert MRN: [id number] Date of Birth: 08/06/1940 Referring Provider:  Charolette Forward, MD  Encounter Date: 09/08/2015  Check In:     Session Check In - 09/08/15 0829    Check-In   Location [facility]   Staff Present Nyoka Cowden, RN;Boyde Grieco, RN, Moises Blood, BS, ACSM CEP, Exercise Physiologist   Supervising physician immediately available to respond to emergencies See telemetry face sheet for immediately available ER MD   Medication changes reported     No   Fall or balance concerns reported    No   Warm-up and Cool-down Performed on first and last piece of equipment   Resistance Training Performed Yes   VAD Patient? No   Pain Assessment   Currently in Pain? No/denies         Goals Met:  Proper associated with RPD/PD & O2 Sat Exercise tolerated well  Goals Unmet:  Not Applicable  Comments:    Dr. Emily Filbert is Medical Director for [facility].

## 2015-09-10 ENCOUNTER — Encounter: Payer: Medicare Other | Admitting: *Deleted

## 2015-09-10 DIAGNOSIS — I213 ST elevation (STEMI) myocardial infarction of unspecified site: Secondary | ICD-10-CM | POA: Diagnosis not present

## 2015-09-10 DIAGNOSIS — Z9861 Coronary angioplasty status: Secondary | ICD-10-CM

## 2015-09-10 NOTE — Progress Notes (Signed)
Daily Session Note  Patient Details  Name: Mandy Gilbert MRN: [id number] Date of Birth: [birth year] Referring Provider:  Charolette Forward, MD  Encounter Date: 09/10/2015  Check In:     Session Check In - 09/10/15 1007    Check-In   Location [facility]   Staff Present Heath Lark, RN, BSN, CCRP;Tryniti Laatsch, RN, Michaela Corner, RRT, RCP, Respiratory Therapist   Supervising physician immediately available to respond to emergencies See telemetry face sheet for immediately available ER MD   Medication changes reported     No   Fall or balance concerns reported    No   Warm-up and Cool-down Performed on first and last piece of equipment   Resistance Training Performed Yes   VAD Patient? No   Pain Assessment   Currently in Pain? No/denies         Goals Met:  Exercise tolerated well No report of cardiac concerns or symptoms  Goals Unmet:  Not Applicable  Comments:    Dr. Emily Filbert is Medical Director for [facility].

## 2015-09-13 ENCOUNTER — Encounter: Payer: Medicare Other | Admitting: *Deleted

## 2015-09-13 DIAGNOSIS — I213 ST elevation (STEMI) myocardial infarction of unspecified site: Secondary | ICD-10-CM | POA: Diagnosis not present

## 2015-09-13 DIAGNOSIS — Z9861 Coronary angioplasty status: Secondary | ICD-10-CM

## 2015-09-13 NOTE — Progress Notes (Signed)
Cardiac Individual Treatment Plan  Patient Details  Name: Mandy Gilbert MRN: [id number] Date of Birth: [birth year] Referring Provider:  Charolette Forward, MD  Initial Encounter Date:       Cardiac Rehab from 07/01/2015 in [facility] Cardiac and Pulmonary Rehab   Date  07/01/15      Visit Diagnosis: S/P PTCA (percutaneous transluminal coronary angioplasty)  Patient's Home Medications on Admission:  Current outpatient prescriptions:  .  aspirin EC 81 MG EC tablet, Take 1 tablet (81 mg total) by mouth daily., Disp: 30 tablet, Rfl: 3 .  atorvastatin (LIPITOR) 20 MG tablet, , Disp: , Rfl:  .  atorvastatin (LIPITOR) 80 MG tablet, Take 1 tablet (80 mg total) by mouth daily at 6 PM., Disp: 30 tablet, Rfl: 3 .  Cholecalciferol (VITAMIN D PO), Take 1,000 Units by mouth every morning. , Disp: , Rfl:  .  DILT-XR 240 MG 24 hr capsule, , Disp: , Rfl:  .  indapamide (LOZOL) 1.25 MG tablet, , Disp: , Rfl:  .  metoprolol succinate (TOPROL-XL) 100 MG 24 hr tablet, Take by mouth., Disp: , Rfl:  .  nitroGLYCERIN (NITROSTAT) 0.4 MG SL tablet, Place 1 tablet (0.4 mg total) under the tongue every 5 (five) minutes x 3 doses as needed for chest pain., Disp: 25 tablet, Rfl: 12 .  propranolol (INDERAL) 20 MG tablet, , Disp: , Rfl:  .  Psyllium (METAMUCIL PO), Take 3 capsules by mouth every morning., Disp: , Rfl:  .  ticagrelor (BRILINTA) 90 MG TABS tablet, Take 1 tablet (90 mg total) by mouth every 12 (twelve) hours., Disp: 60 tablet, Rfl: 11  Past Medical History: Past Medical History  Diagnosis Date  . Hypertension   . Constipation   . Cancer (HCC)     Skin cancer legs, basal cell.    Tobacco Use: History  Smoking status  . Never Smoker   Smokeless tobacco  . Not on file    Labs: Recent Review Flowsheet Data    Labs for ITP Cardiac and Pulmonary Rehab Latest Ref Rng 06/11/2015 06/11/2015 06/11/2015 06/11/2015 06/12/2015   Cholestrol 0 - 200 mg/dL - 156 - - 173   LDLCALC 0 - 99 mg/dL - 82 - - 90   HDL >40 mg/dL  - 63 - - 74   Trlycerides <150 mg/dL - 56 - - 44   Hemoglobin A1c 4.8 - 5.6 % - - 5.8(H) 5.8(H) -   TCO2 0 - 100 mmol/L 14 - - - -       Exercise Target Goals:    Exercise Program Goal: Individual exercise prescription set with THRR, safety & activity barriers. Participant demonstrates ability to understand and report RPE using BORG scale, to self-measure pulse accurately, and to acknowledge the importance of the exercise prescription.  Exercise Prescription Goal: Starting with aerobic activity 30 plus minutes a day, 3 days per week for initial exercise prescription. Provide home exercise prescription and guidelines that participant acknowledges understanding prior to discharge.  Activity Barriers & Risk Stratification:     Activity Barriers & Cardiac Risk Stratification - 07/01/15 1531    Activity Barriers & Cardiac Risk Stratification   Activity Barriers Arthritis;Back Problems   Cardiac Risk Stratification Moderate      6 Minute Walk:     6 Minute Walk      07/01/15 1452 09/06/15 0952     6 Minute Walk   Phase Initial Discharge    Distance 1300 feet 1650 feet    Distance % Change  27 %    Walk Time 6 minutes 6 minutes    # of Rest Breaks  0    RPE 11 13    Symptoms No No    Resting HR 87 bpm 98 bpm    Resting BP 128/84 mmHg 126/70 mmHg    Max Ex. HR 106 bpm 111 bpm    Max Ex. BP 142/74 mmHg 144/74 mmHg       Initial Exercise Prescription:     Initial Exercise Prescription - 07/01/15 1400    Date of Initial Exercise Prescription   Date 07/01/15   Treadmill   MPH 2.5   Grade 0   Minutes 10   Recumbant Bike   Level 2   RPM 40   Watts 20   Minutes 10   NuStep   Level 2   Watts 40   Minutes 10   Arm Ergometer   Level 1   Watts 10   Minutes 10   Recumbant Elliptical   Level 2   RPM 40   Watts 20   Minutes 10   Elliptical   Level 1   Speed 3   Minutes 1   REL-XR   Level 2   Watts 50   Minutes 10   T5 Nustep   Level 1   Watts 15    Minutes 10   Biostep-RELP   Level 2   Watts 40   Minutes 10   Prescription Details   Frequency (times per week) 3   Duration Progress to 30 minutes of continuous aerobic without signs/symptoms of physical distress   Intensity   THRR REST +  30   Ratings of Perceived Exertion 11-15   Perceived Dyspnea 2-4   Progression   Progression Continue progressive overload as per policy without signs/symptoms or physical distress.   Resistance Training   Training Prescription Yes   Weight 2   Reps 10-15      Perform Capillary Blood Glucose checks as needed.  Exercise Prescription Changes:     Exercise Prescription Changes      07/19/15 1500 07/21/15 0900 07/30/15 0900 08/02/15 1000 08/11/15 0800   Exercise Review   Progression Yes Yes Yes Yes Yes   Response to Exercise   Blood Pressure (Admit) 128/88 mmHg       Blood Pressure (Exercise) 130/86 mmHg       Blood Pressure (Exit) 110/62 mmHg       Heart Rate (Admit) 96 bpm       Heart Rate (Exercise) 87 bpm       Heart Rate (Exit) 89 bpm       Rating of Perceived Exertion (Exercise) 13       Symptoms None None None None None   Comments Rosita had a small set back with her blood pressure and heart rate and was out for a few sessions. She hopes to have more consistent attendance from now on. Despite only having 3 exercise sessions she is already feeling more comfortable on the equipment and has made some increases to her workloads. Added the TM to her routine and did very well! Added the TM to her routine and did very well! Reviewed individualized exercise prescription and made increases per departmental policy. Exercise increases were discussed with the patient and they were able to perform the new work loads without issue (no signs or symptoms).  Adjusted ex. rx. due to increase in stamina and strength.    Duration Progress to 30 minutes  of continuous aerobic without signs/symptoms of physical distress Progress to 30 minutes of continuous  aerobic without signs/symptoms of physical distress Progress to 30 minutes of continuous aerobic without signs/symptoms of physical distress Progress to 30 minutes of continuous aerobic without signs/symptoms of physical distress Progress to 30 minutes of continuous aerobic without signs/symptoms of physical distress   Intensity Rest + 30 Rest + 30 Rest + 30 Rest + 30 Rest + 30   Progression   Progression Continue progressive overload as per policy without signs/symptoms or physical distress. Continue progressive overload as per policy without signs/symptoms or physical distress. Continue progressive overload as per policy without signs/symptoms or physical distress. Continue progressive overload as per policy without signs/symptoms or physical distress. Continue progressive overload as per policy without signs/symptoms or physical distress.   Resistance Training   Training Prescription     Yes   Weight     3   Reps     10-15   Training Prescription (read-only) Yes Yes Yes Yes    Weight (read-only) 2 2 3 3     Reps (read-only) 10-15 10-15 10-15 10-15    Interval Training   Interval Training No No No No No   Treadmill   MPH     1.7   Grade     0   Minutes     15   MPH (read-only)  1.5 1.5 1.7    Grade (read-only)  0 0 0    Minutes (read-only)  10 10 15     NuStep   Level     5   Watts     90   Minutes     20   Level (read-only) 3 3 3 4     Watts (read-only) 45 45 45 50    Minutes (read-only) 20 20 20 20     REL-XR   Level     3   Watts     50   Minutes     20   Level (read-only) 3 3 3 3     Watts (read-only) 50 50 50 50    Minutes (read-only) 20 20 20 20       08/16/15 0900 08/18/15 0800 08/23/15 0900 09/06/15 0800     Exercise Review   Progression Yes Yes Yes Yes    Response to Exercise   Blood Pressure (Admit) 120/70 mmHg       Blood Pressure (Exercise) 144/68 mmHg       Blood Pressure (Exit) 120/78 mmHg       Heart Rate (Admit) 89 bpm       Heart Rate (Exercise) 120 bpm        Heart Rate (Exit) 85 bpm       Rating of Perceived Exertion (Exercise) 13       Symptoms None None None None    Comments Helped Nikesha with her goal of having 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 We will start her on interval training and continuously progress her workloads in an effort to increase her stamina. Lakira will still maintain her 45 minutes of aerobic work.        Duration Progress to 30 minutes of continuous aerobic without signs/symptoms of physical distress Progress to 30 minutes of continuous aerobic without signs/symptoms of physical distress Progress to 50 minutes of aerobic without signs/symptoms of physical distress Progress to 50 minutes of aerobic without signs/symptoms of physical distress    Intensity Rest + 30 Rest +  30 Rest + 30 Rest + 30    Progression   Progression Continue progressive overload as per policy without signs/symptoms or physical distress. Continue progressive overload as per policy without signs/symptoms or physical distress. Continue progressive overload as per policy without signs/symptoms or physical distress. Continue progressive overload as per policy without signs/symptoms or physical distress.    Resistance Training   Training Prescription Yes Yes Yes Yes    Weight 3 3 3 3     Reps 10-15 10-15 10-15 10-15    Interval Training   Interval Training No Yes Yes Yes    Equipment  REL-XR REL-XR REL-XR    Comments  L5-7 90-100 watts L5-7 90-100 watts L5-7 90-100 watts    Treadmill   MPH 2 2 2  2.2    Grade 3 3 3 2     Minutes 15 15 15 15     NuStep   Level 5 5 5 5     Watts 90 90 90 90    Minutes 20 20 20 20     REL-XR   Level 3 3 3 3     Watts 50 50 50 50    Minutes 20 20 20 20     Home Exercise Plan   Plans to continue exercise at    [facility] (comment)       Exercise Comments:     Exercise Comments      09/06/15 0846 09/08/15 0945         Exercise Comments Reviewed individualized exercise prescription and made  increases per departmental policy. Exercise increases were discussed with the patient and they were able to perform the new work loads without issue (no signs or symptoms).  Iverna is about ready to finish 36/36 sessions of Cardiac Rehab. She is checking about exercise options to exercise with her husband. Her husband has a history of medical problems so I suggested the [employer] class.          Discharge Exercise Prescription (Final Exercise Prescription Changes):     Exercise Prescription Changes - 09/06/15 0800    Exercise Review   Progression Yes   Response to Exercise   Symptoms None   Duration Progress to 50 minutes of aerobic without signs/symptoms of physical distress   Intensity Rest + 30   Progression   Progression Continue progressive overload as per policy without signs/symptoms or physical distress.   Resistance Training   Training Prescription Yes   Weight 3   Reps 10-15   Interval Training   Interval Training Yes   Equipment REL-XR   Comments L5-7 90-100 watts   Treadmill   MPH 2.2   Grade 2   Minutes 15   NuStep   Level 5   Watts 90   Minutes 20   REL-XR   Level 3   Watts 50   Minutes 20   Home Exercise Plan   Plans to continue exercise at [employer] (comment)      Nutrition:  Target Goals: Understanding of nutrition guidelines, daily intake of sodium <1516m, cholesterol <2034m calories 30% from fat and 7% or less from saturated fats, daily to have 5 or more servings of fruits and vegetables.  Biometrics:     Pre Biometrics - 07/01/15 1455    Pre Biometrics   Height 5' 5.5" (1.664 m)   Weight 143 lb (64.864 kg)   Waist Circumference 37 inches   Hip Circumference 42.5 inches   Waist to Hip Ratio 0.87 %  BMI (Calculated) 23.5         Post Biometrics - 09/06/15 0953     Post  Biometrics   Height 5' 5.5" (1.664 m)   Weight 143 lb (64.864 kg)   Waist Circumference 28 inches   Hip Circumference 38.5 inches   Waist  to Hip Ratio 0.73 %   BMI (Calculated) 23.5      Nutrition Therapy Plan and Nutrition Goals:     Nutrition Therapy & Goals - 09/10/15 1512    Nutrition Therapy   Diet Instructed on DASH diet principles    Drug/Food Interactions Statins/Certain Fruits   Protein (specify units) 6   Fiber 20 grams   Whole Grain Foods 3 servings   Saturated Fats 10 max. grams   Fruits and Vegetables 5 servings/day   Sodium 1500 grams   Personal Nutrition Goals   Personal Goal #1 To read labels for saturated fat, trans fat and sodium   Personal Goal #2 To include at least 5 servings of fruits/vegetables per day   Intervention Plan   Intervention Prescribe, educate and counsel regarding individualized specific dietary modifications aiming towards targeted core components such as weight, hypertension, lipid management, diabetes, heart failure and other comorbidities.;Nutrition handout(s) given to patient.   Expected Outcomes Short Term Goal: A plan has been developed with personal nutrition goals set during dietitian appointment.;Short Term Goal: Understand basic principles of dietary content, such as calories, fat, sodium, cholesterol and nutrients.;Long Term Goal: Adherence to prescribed nutrition plan.      Nutrition Discharge: Rate Your Plate Scores:     Nutrition Assessments - 09/10/15 1514    Rate Your Plate Scores   Pre Score 60   Pre Score % 67 %      Nutrition Goals Re-Evaluation:     Nutrition Goals Re-Evaluation      09/08/15 0945 09/13/15 1517         Personal Goal #1 Re-Evaluation   Personal Goal #1 Channa feels she eats healthy already. She has a very good BMI.        Goal Progress Seen Yes       Comments Avri said she met individually with the Cardiac Rehab registered dietician. Alawna said that went fine. Volanda said I already knew the joke "anything that tastes good to spit it out".       Personal Goal #2 Re-Evaluation   Goal Progress Seen  Yes         Psychosocial: Target  Goals: Acknowledge presence or absence of depression, maximize coping skills, provide positive support system. Participant is able to verbalize types and ability to use techniques and skills needed for reducing stress and depression.  Initial Review & Psychosocial Screening:     Initial Psych Review & Screening - 07/01/15 [place]? Yes   Comments Verbena said she is busy all the time but expect for walking she has not done any formal exercise and is anxious what to expect. 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 Very supportive husband. Very   Screening Interventions   Interventions Encouraged to exercise      Quality of Life Scores:     Quality of Life - 09/06/15 1743    Quality of Life Scores   Health/Function Post 23.6 %   Socioeconomic Post 30 %   Psych/Spiritual Post 24.86 %   Family Post 28.8 %   GLOBAL Post 25.82 %  PHQ-9:     Recent Review Flowsheet Data    Depression screen [facility] 2/9 09/06/2015 07/01/2015   Decreased Interest 0 1   Down, Depressed, Hopeless 0 1   PHQ - 2 Score 0 2   Altered sleeping - 1   Tired, decreased energy 1 2   Change in appetite 1 2   Feeling bad or failure about yourself  0 1   Trouble concentrating 1 1   Moving slowly or fidgety/restless 0 0   Suicidal thoughts 0 0   PHQ-9 Score - 9      Psychosocial Evaluation and Intervention:     Psychosocial Evaluation - 09/06/15 1029    Discharge Psychosocial Assessment & Intervention   Comments Counselor met with Ms. Moll today for discharge evaluation.  She reports positive benefits experienced from this program with increased stamina and strength and healthier eating.  As a result, she has noticed some continued weight loss since joining this program.  She and her spouse are planning to either join the [employer] or the [facility] and work out together following completion of this program.  Ms. Mabry has never enjoyed  exercising and this program has changed her attitude.  Counselor commended Ms. Tew for her commitment and hard work in completing this program.  She has been a pleasure to work with and her attitude has always been positive and she has practiced healthy stress management throughout.         Psychosocial Re-Evaluation:     Psychosocial Re-Evaluation      09/08/15 0948 09/13/15 1519         Psychosocial Re-Evaluation   Interventions Stress management education;Relaxation education       Comments Adan said Cardiac Rehab has helped her since exercise helps with stress and helps her to sleep better.  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         Vocational Rehabilitation: Provide vocational rehab assistance to qualifying candidates.   Vocational Rehab Evaluation & Intervention:     Vocational Rehab - 07/01/15 1533    Initial Vocational Rehab Evaluation & Intervention   Assessment shows need for Vocational Rehabilitation No      Education: Education Goals: Education classes will be provided on a weekly basis, covering required topics. Participant will state understanding/return demonstration of topics presented.  Learning Barriers/Preferences:     Learning Barriers/Preferences - 07/01/15 1532    Learning Barriers/Preferences   Learning Barriers None   Learning Preferences Video;Written Material      Education Topics: General Nutrition Guidelines/Fats and Fiber: -Group instruction provided by verbal, written material, models and posters to present the general guidelines for heart healthy nutrition. Gives an explanation and review of dietary fats and fiber.          Cardiac Rehab from 09/13/2015 in [facility] Cardiac and Pulmonary Rehab   Date  09/06/15   Educator  CR   Instruction Review Code  2- meets goals/outcomes      Controlling Sodium/Reading  Food Labels: -Group verbal and written material supporting the discussion of sodium use in heart healthy nutrition. Review and explanation with models, verbal and written materials for utilization of the food label.      Cardiac Rehab from 09/13/2015 in [facility] Cardiac and Pulmonary Rehab   Date  09/13/15   Educator  [facility]   Instruction Review Code  2-  meets goals/outcomes      Exercise Physiology & Risk Factors: - Group verbal and written instruction with models to review the exercise physiology of the cardiovascular system and associated critical values. Details cardiovascular disease risk factors and the goals associated with each risk factor.      Cardiac Rehab from 09/13/2015 in [facility] Cardiac and Pulmonary Rehab   Date  08/02/15   Educator  RM   Instruction Review Code  2- meets goals/outcomes      Aerobic Exercise & Resistance Training: - Gives group verbal and written discussion on the health impact of inactivity. On the components of aerobic and resistive training programs and the benefits of this training and how to safely progress through these programs.      Cardiac Rehab from 09/13/2015 in [facility] Cardiac and Pulmonary Rehab   Date  08/04/15   Educator  SW   Instruction Review Code  2- meets goals/outcomes      Flexibility, Balance, General Exercise Guidelines: - Provides group verbal and written instruction on the benefits of flexibility and balance training programs. Provides general exercise guidelines with specific guidelines to those with heart or lung disease. Demonstration and skill practice provided.      Cardiac Rehab from 09/13/2015 in [facility] Cardiac and Pulmonary Rehab   Date  08/09/15   Educator  [facility]   Instruction Review Code  2- meets goals/outcomes      Stress Management: - Provides group verbal and written instruction about the health risks of elevated stress, cause of high stress, and healthy ways to reduce stress.      Cardiac Rehab from 09/13/2015 in [facility] Cardiac and  Pulmonary Rehab   Date  08/11/15   Educator  [facility]   Instruction Review Code  2- meets goals/outcomes      Depression: - Provides group verbal and written instruction on the correlation between heart/lung disease and depressed mood, treatment options, and the stigmas associated with seeking treatment.      Cardiac Rehab from 09/13/2015 in [facility] Cardiac and Pulmonary Rehab   Date  09/08/15   Educator  [facility]   Instruction Review Code  2- meets goals/outcomes      Anatomy & Physiology of the Heart: - Group verbal and written instruction and models provide basic cardiac anatomy and physiology, with the coronary electrical and arterial systems. Review of: AMI, Angina, Valve disease, Heart Failure, Cardiac Arrhythmia, Pacemakers, and the ICD.      Cardiac Rehab from 09/13/2015 in [facility] Cardiac and Pulmonary Rehab   Date  08/16/15   Educator  SB   Instruction Review Code  2- meets goals/outcomes      Cardiac Procedures: - Group verbal and written instruction and models to describe the testing methods done to diagnose heart disease. Reviews the outcomes of the test results. Describes the treatment choices: Medical Management, Angioplasty, or Coronary Bypass Surgery.      Cardiac Rehab from 09/13/2015 in [facility] Cardiac and Pulmonary Rehab   Date  08/23/15   Educator  SB   Instruction Review Code  2- meets goals/outcomes      Cardiac Medications: - Group verbal and written instruction to review commonly prescribed medications for heart disease. Reviews the medication, class of the drug, and side effects. Includes the steps to properly store meds and maintain the prescription regimen.      Cardiac Rehab from 09/13/2015 in [facility] Cardiac and Pulmonary Rehab   Date  08/25/15   Educator  SB   Instruction Review Code  2- meets goals/outcomes      Go Sex-Intimacy & Heart Disease, Get SMART - Goal Setting: - Group verbal and written instruction through game format to discuss heart disease and the return  to sexual intimacy. Provides group verbal and written material to discuss and apply goal setting through the application of the S.M.A.R.T. Method.      Cardiac Rehab from 09/13/2015 in [facility] Cardiac and Pulmonary Rehab   Date  08/23/15   Educator  SB   Instruction Review Code  2- meets goals/outcomes      Other Matters of the Heart: - Provides group verbal, written materials and models to describe Heart Failure, Angina, Valve Disease, and Diabetes in the realm of heart disease. Includes description of the disease process and treatment options available to the cardiac patient.   Exercise & Equipment Safety: - Individual verbal instruction and demonstration of equipment use and safety with use of the equipment.      Cardiac Rehab from 09/13/2015 in [facility] Cardiac and Pulmonary Rehab   Date  07/01/15   Educator  C. EnterkinRN   Instruction Review Code  1- partially meets, needs review/practice      Infection Prevention: - Provides verbal and written material to individual with discussion of infection control including proper hand washing and proper equipment cleaning during exercise session.      Cardiac Rehab from 09/13/2015 in [facility] Cardiac and Pulmonary Rehab   Date  07/01/15   Educator  C. EnterkinRN   Instruction Review Code  2- meets goals/outcomes      Falls Prevention: - Provides verbal and written material to individual with discussion of falls prevention and safety.      Cardiac Rehab from 09/13/2015 in [facility] Cardiac and Pulmonary Rehab   Date  07/01/15   Educator  C. Tasha Diaz, RN   Instruction Review Code  2- meets goals/outcomes      Diabetes: - Individual verbal and written instruction to review signs/symptoms of diabetes, desired ranges of glucose level fasting, after meals and with exercise. Advice that pre and post exercise glucose checks will be done for 3 sessions at entry of program.    Knowledge Questionnaire Score:     Knowledge Questionnaire Score - 09/13/15  1516    Knowledge Questionnaire Score   Post Score 27      Core Components/Risk Factors/Patient Goals at Admission:     Personal Goals and Risk Factors at Admission - 07/01/15 1535    Core Components/Risk Factors/Patient Goals on Admission   Sedentary Yes   Intervention (read-only) While in program, learn and follow the exercise prescription taught. Start at a low level workload and increase workload after able to maintain previous level for 30 minutes. Increase time before increasing intensity.   Diabetes No   Hypertension Yes   Goal Participant will see blood pressure controlled within the values of 140/27m/Hg or within value directed by their physician.   Intervention (read-only) Provide nutrition & aerobic exercise along with prescribed medications to achieve BP 140/90 or less.   Lipids Yes   Goal Cholesterol controlled with medications as prescribed, with individualized exercise RX and with personalized nutrition plan. Value goals: LDL < 731m HDL > 4067mParticipant states understanding of desired cholesterol values and following prescriptions.   Intervention (read-only) Provide nutrition & aerobic exercise along with prescribed medications to achieve LDL <40m105mDL >40mg27mTake Less Medication Yes   Intervention Learn your risk factors and begin the lifestyle modifications for risk factor control during  your time in the program.   Understand more about Heart/Pulmonary Disease. Yes      Core Components/Risk Factors/Patient Goals Review:      Goals and Risk Factor Review      07/19/15 1533 08/16/15 0935 09/08/15 0948 09/13/15 1517     Core Components/Risk Factors/Patient Goals Review   Personal Goals Review Increase Aerobic Exercise and Physical Activity Weight Management/Obesity;Hypertension;Lipids;Other      Review  Edra is having blood work done on 08/18/15 and will update [place] after she receives her results on her cholesterol levels. Kataya's BP has been in acceptable ranges  during her time in cardiac rehab and continues to take her BP medication, there is no talk from her MD to come off of her BP meds. Eun would like to come off her blood thinner and will talk to her MD in April about this matter, Romona is within an acceptable body composition but would like to lose weight. She has lost a few pounds during the program but would still like to lose a few more. 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 She would like to increase her exercise work to gain strength and stamina to work this shift.  Signa is about ready to finish 36/36 sessions of Cardiac Rehab. She is checking about exercise options to exercise with her husband. Her husband has a history of medical problems so I suggested the [employer] class. Sevana blood pressure is very stable.  Beryl is still trying to decide where to exercise with her husband. Berle Mull also encouraged her to get an exercise plan quickly. 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    Increase Aerobic Exercise and Physical Activity (read-only)   Goals Progress/Improvement seen  Yes       Comments Olivianna can exercise continuously for 30 minutes and in the next few weeks we hope to max that out at 45-50 minutes. She has increased her workloads on the NS and XR machine and is gaining strength and stamina. She is more comfortable on the machines and overall is very happy with the program and says she can see the exercise working. With more consistent attendance we hope to see further increases for Chanda in the coming weeks and a translation of how this is improving her ADLs.           Core Components/Risk Factors/Patient Goals at Discharge (Final Review):      Goals and Risk Factor Review - 09/13/15 1517    Core Components/Risk Factors/Patient Goals  Review   Review Tiombe is still trying to decide where to exercise with her husband. Berle Mull also encouraged her to get an exercise plan quickly. 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      ITP Comments:     ITP Comments      07/12/15 1038 07/12/15 1041 07/12/15 1044 07/26/15 0822 07/27/15 0833   ITP Comments Mikelle came to class todat stating she was not her normal self, She woke up last night not feeling right but no specfic concern. On arrival today she was noted to be in bigeminy rhythm. A new rhythm  to [place].  CAlled Dr  HArwani and spoke to him aabout this rhythm, he spoke to Ren[place] and advised her to increase her metoprolol to 3 times a day. He also will see her tomorrow at 2 pm in his office.  She was advised by Dr Terrence Dupont and myself to call 911 if she had increasing symptoms or concerns before her appointment. She was told to have her husband drive her to the appointment.  This note is for 07/12/2015 arrival to the program. Harmon Pier wsa sent home to rest and relax until her appointment on 2/7 See ITP note placed in the 2/3 encounter for concerns on arrival today. No exercise today, education only, had to leave early due to appointment.  30 day review. Continue with ITP     07/27/15 0834 08/04/15 0723 08/26/15 1318 09/08/15 0944 09/13/15 1518   ITP Comments Has attended a few sessions since orientation 30 day review   Continue with ITP 30 Day Review. Continue with the ITP. Lundon is about ready to finish 36/36 sessions of Cardiac Rehab. She is checking about exercise options to exercise with her husband. Her husband has a history of medical problems so I suggested the [employer] class.  Cambree is still trying to decide where to exercise with her husband. Berle Mull also encouraged her to get an exercise plan quickly. 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      Comments: Sidnie is still trying to decide where to exercise with her husband. Berle Mull also encouraged her to get an exercise plan quickly. 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

## 2015-09-13 NOTE — Progress Notes (Signed)
Daily Session Note  Patient Details  Name: DUNIA PRINGLE MRN: [id number] Date of Birth: [birth year] Referring Provider:  Charolette Forward, MD  Encounter Date: 09/13/2015  Check In:     Session Check In - 09/13/15 0832    Check-In   Location [facility]   Staff Present Nyoka Cowden, RN;Elmo Rio, RN, Moises Blood, BS, ACSM CEP, Exercise Physiologist   Supervising physician immediately available to respond to emergencies See telemetry face sheet for immediately available ER MD   Medication changes reported     No   Fall or balance concerns reported    No   Resistance Training Performed Yes   VAD Patient? No   Pain Assessment   Currently in Pain? No/denies         Goals Met:  Proper associated with RPD/PD & O2 Sat Exercise tolerated well No report of cardiac concerns or symptoms  Goals Unmet:  Not Applicable  Comments:    Dr. Emily Filbert is Medical Director for [facility].

## 2015-09-13 NOTE — Progress Notes (Signed)
Discharge Summary  Patient Details  Name: Mandy Gilbert MRN: [id number] Date of Birth: 1940-11-19 Referring Provider:  Charolette Forward, MD   Number of Visits: 39  Reason for Discharge:  Patient reached a stable level of exercise. Patient independent in their exercise.  Smoking History:  History  Smoking status  . Never Smoker   Smokeless tobacco  . Not on file    Diagnosis:  S/P PTCA (percutaneous transluminal coronary angioplasty)  ADL UCSD:   Initial Exercise Prescription:     Initial Exercise Prescription - 07/01/15 1400    Date of Initial Exercise Prescription   Date 07/01/15   Treadmill   MPH 2.5   Grade 0   Minutes 10   Recumbant Bike   Level 2   RPM 40   Watts 20   Minutes 10   NuStep   Level 2   Watts 40   Minutes 10   Arm Ergometer   Level 1   Watts 10   Minutes 10   Recumbant Elliptical   Level 2   RPM 40   Watts 20   Minutes 10   Elliptical   Level 1   Speed 3   Minutes 1   REL-XR   Level 2   Watts 50   Minutes 10   T5 Nustep   Level 1   Watts 15   Minutes 10   Biostep-RELP   Level 2   Watts 40   Minutes 10   Prescription Details   Frequency (times per week) 3   Duration Progress to 30 minutes of continuous aerobic without signs/symptoms of physical distress   Intensity   THRR REST +  30   Ratings of Perceived Exertion 11-15   Perceived Dyspnea 2-4   Progression   Progression Continue progressive overload as per policy without signs/symptoms or physical distress.   Resistance Training   Training Prescription Yes   Weight 2   Reps 10-15      Discharge Exercise Prescription (Final Exercise Prescription Changes):     Exercise Prescription Changes - 09/06/15 0800    Exercise Review   Progression Yes   Response to Exercise   Symptoms None   Duration Progress to 50 minutes of aerobic without signs/symptoms of physical distress   Intensity Rest + 30   Progression   Progression Continue progressive overload as per  policy without signs/symptoms or physical distress.   Resistance Training   Training Prescription Yes   Weight 3   Reps 10-15   Interval Training   Interval Training Yes   Equipment REL-XR   Comments L5-7 90-100 watts   Treadmill   MPH 2.2   Grade 2   Minutes 15   NuStep   Level 5   Watts 90   Minutes 20   REL-XR   Level 3   Watts 50   Minutes 20   Home Exercise Plan   Plans to continue exercise at [facility] (comment)      Functional Capacity:     6 Minute Walk      07/01/15 1452 09/06/15 0952     6 Minute Walk   Phase Initial Discharge    Distance 1300 feet 1650 feet    Distance % Change  27 %    Walk Time 6 minutes 6 minutes    # of Rest Breaks  0    RPE 11 13    Symptoms No No    Resting HR 87 bpm 98  bpm    Resting BP 128/84 mmHg 126/70 mmHg    Max Ex. HR 106 bpm 111 bpm    Max Ex. BP 142/74 mmHg 144/74 mmHg       Psychological, QOL, Others - Outcomes: PHQ 2/9: Depression screen [facility] 2/9 09/06/2015 07/01/2015  Decreased Interest 0 1  Down, Depressed, Hopeless 0 1  PHQ - 2 Score 0 2  Altered sleeping - 1  Tired, decreased energy 1 2  Change in appetite 1 2  Feeling bad or failure about yourself  0 1  Trouble concentrating 1 1  Moving slowly or fidgety/restless 0 0  Suicidal thoughts 0 0  PHQ-9 Score - 9    Quality of Life:     Quality of Life - 09/06/15 1743    Quality of Life Scores   Health/Function Post 23.6 %   Socioeconomic Post 30 %   Psych/Spiritual Post 24.86 %   Family Post 28.8 %   GLOBAL Post 25.82 %      Personal Goals: Goals established at orientation with interventions provided to work toward goal.     Personal Goals and Risk Factors at Admission - 07/01/15 1535    Core Components/Risk Factors/Patient Goals on Admission   Sedentary Yes   Intervention (read-only) While in program, learn and follow the exercise prescription taught. Start at a low level workload and increase workload after able to maintain previous  level for 30 minutes. Increase time before increasing intensity.   Diabetes No   Hypertension Yes   Goal Participant will see blood pressure controlled within the values of 140/41mm/Hg or within value directed by their physician.   Intervention (read-only) Provide nutrition & aerobic exercise along with prescribed medications to achieve BP 140/90 or less.   Lipids Yes   Goal Cholesterol controlled with medications as prescribed, with individualized exercise RX and with personalized nutrition plan. Value goals: LDL < 70mg , HDL > 40mg . Participant states understanding of desired cholesterol values and following prescriptions.   Intervention (read-only) Provide nutrition & aerobic exercise along with prescribed medications to achieve LDL 70mg , HDL >40mg .   Take Less Medication Yes   Intervention Learn your risk factors and begin the lifestyle modifications for risk factor control during your time in the program.   Understand more about Heart/Pulmonary Disease. Yes       Personal Goals Discharge:     Goals and Risk Factor Review      07/19/15 1533 08/16/15 0935 09/08/15 0948 09/13/15 1517     Core Components/Risk Factors/Patient Goals Review   Personal Goals Review Increase Aerobic Exercise and Physical Activity Weight Management/Obesity;Hypertension;Lipids;Other      Review  Marnita is having blood work done on 08/18/15 and will update [place] after she receives her results on her cholesterol levels. Kristina's BP has been in acceptable ranges during her time in cardiac rehab and continues to take her BP medication, there is no talk from her MD to come off of her BP meds. Zula would like to come off her blood thinner and will talk to her MD in April about this matter, Jessee is within an acceptable body composition but would like to lose weight. She has lost a few pounds during the program but would still like to lose a few more. 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 She would like to increase her exercise work to gain strength  and stamina to work this shift.  Brett is about ready to finish 36/36 sessions of Cardiac Rehab. She is checking about exercise options to exercise with her husband. Her husband has a history of medical problems so I suggested the [employer] class. Saturn blood pressure is very stable.  Aunyae is still trying to decide where to exercise with her husband. Berle Mull also encouraged her to get an exercise plan quickly. 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    Increase Aerobic Exercise and Physical Activity (read-only)   Goals Progress/Improvement seen  Yes       Comments Yuvika can exercise continuously for 30 minutes and in the next few weeks we hope to max that out at 45-50 minutes. She has increased her workloads on the NS and XR machine and is gaining strength and stamina. She is more comfortable on the machines and overall is very happy with the program and says she can see the exercise working. With more consistent attendance we hope to see further increases for Cearra in the coming weeks and a translation of how this is improving her ADLs.           Nutrition & Weight - Outcomes:     Pre Biometrics - 07/01/15 1455    Pre Biometrics   Height 5' 5.5" (1.664 m)   Weight 143 lb (64.864 kg)   Waist Circumference 37 inches   Hip Circumference 42.5 inches   Waist to Hip Ratio 0.87 %   BMI (Calculated) 23.5         Post Biometrics - 09/06/15 0953     Post  Biometrics   Height 5' 5.5" (1.664 m)   Weight 143 lb (64.864 kg)   Waist Circumference 28 inches   Hip Circumference 38.5 inches   Waist to Hip Ratio 0.73 %   BMI (Calculated) 23.5      Nutrition:     Nutrition Therapy & Goals - 09/10/15 1512    Nutrition Therapy   Diet Instructed on DASH diet principles     Drug/Food Interactions Statins/Certain Fruits   Protein (specify units) 6   Fiber 20 grams   Whole Grain Foods 3 servings   Saturated Fats 10 max. grams   Fruits and Vegetables 5 servings/day   Sodium 1500 grams   Personal Nutrition Goals   Personal Goal #1 To read labels for saturated fat, trans fat and sodium   Personal Goal #2 To include at least 5 servings of fruits/vegetables per day   Intervention Plan   Intervention Prescribe, educate and counsel regarding individualized specific dietary modifications aiming towards targeted core components such as weight, hypertension, lipid management, diabetes, heart failure and other comorbidities.;Nutrition handout(s) given to patient.   Expected Outcomes Short Term Goal: A plan has been developed with personal nutrition goals set during dietitian appointment.;Short Term Goal: Understand basic principles of dietary content, such as calories, fat, sodium, cholesterol and nutrients.;Long Term Goal: Adherence to prescribed nutrition plan.      Nutrition Discharge:     Nutrition Assessments - 09/10/15 1514    Rate Your Plate Scores   Pre Score 60   Pre Score % 67 %      Education Questionnaire Score:     Knowledge Questionnaire Score - 09/13/15 1516    Knowledge Questionnaire Score   Post Score 27      Goals  reviewed with patient; copy given to patient.

## 2015-09-13 NOTE — Addendum Note (Signed)
Addended by: Gerlene Burdock on: 09/13/2015 03:23 PM   Modules accepted: Orders

## 2015-09-30 ENCOUNTER — Ambulatory Visit
Admission: RE | Admit: 2015-09-30 | Discharge: 2015-09-30 | Disposition: A | Discharge status: Home / Self Care | Payer: Medicare Other | Source: Ambulatory Visit | Attending: Internal Medicine | Admitting: Internal Medicine

## 2015-09-30 DIAGNOSIS — Z1231 Encounter for screening mammogram for malignant neoplasm of breast: Secondary | ICD-10-CM | POA: Diagnosis not present

## 2015-10-11 ENCOUNTER — Encounter: Payer: Self-pay | Admitting: Podiatry

## 2015-10-11 ENCOUNTER — Ambulatory Visit (INDEPENDENT_AMBULATORY_CARE_PROVIDER_SITE_OTHER): Payer: Medicare Other | Admitting: Podiatry

## 2015-10-11 DIAGNOSIS — B351 Tinea unguium: Secondary | ICD-10-CM

## 2015-10-11 DIAGNOSIS — Q828 Other specified congenital malformations of skin: Secondary | ICD-10-CM | POA: Diagnosis not present

## 2015-10-11 DIAGNOSIS — M79676 Pain in unspecified toe(s): Secondary | ICD-10-CM

## 2015-10-13 NOTE — Progress Notes (Signed)
She presents today she complained of painful elongated toenails 1 through 5 bilateral.  Objective: Vital signs are stable alert and oriented 3. Toenails are thick yellow dystrophic and mycotic and painful palpation.  Assessment: Pain and limp secondary to onychomycosis 1 through 5 bilateral.  Plan: Debridement of toenails 1 through 5 bilateral.

## 2015-12-20 ENCOUNTER — Ambulatory Visit (INDEPENDENT_AMBULATORY_CARE_PROVIDER_SITE_OTHER): Payer: Medicare Other | Admitting: Podiatry

## 2015-12-20 ENCOUNTER — Encounter: Payer: Self-pay | Admitting: Podiatry

## 2015-12-20 DIAGNOSIS — B351 Tinea unguium: Secondary | ICD-10-CM | POA: Diagnosis not present

## 2015-12-20 DIAGNOSIS — Q828 Other specified congenital malformations of skin: Secondary | ICD-10-CM | POA: Diagnosis not present

## 2015-12-20 DIAGNOSIS — M79676 Pain in unspecified toe(s): Secondary | ICD-10-CM

## 2015-12-20 NOTE — Progress Notes (Signed)
She presents today with a chief complaint of painful elongated toenails with corns and calluses.  Objective: Vital signs are stable she is alert and oriented 3 pulses are palpable. Toenails are thick yellow dystrophic onychomycotic painful palpation. Corns and calluses are present along the forefoot with porokeratotic lesions.  Assessment: Pain in limb secondary to onychomycosis and porokeratosis.  Plan: Debridement of toenails 1 through 5 bilateral from service secondary to pain.

## 2016-03-22 ENCOUNTER — Ambulatory Visit (INDEPENDENT_AMBULATORY_CARE_PROVIDER_SITE_OTHER): Payer: Medicare Other | Admitting: Podiatry

## 2016-03-22 DIAGNOSIS — Q828 Other specified congenital malformations of skin: Secondary | ICD-10-CM | POA: Diagnosis not present

## 2016-03-22 DIAGNOSIS — B351 Tinea unguium: Secondary | ICD-10-CM | POA: Diagnosis not present

## 2016-03-22 DIAGNOSIS — M79676 Pain in unspecified toe(s): Secondary | ICD-10-CM

## 2016-03-23 ENCOUNTER — Other Ambulatory Visit: Payer: Self-pay | Admitting: Cardiology

## 2016-03-23 DIAGNOSIS — R079 Chest pain, unspecified: Secondary | ICD-10-CM

## 2016-03-23 NOTE — Progress Notes (Signed)
She presents today with chief complaint of painful elongated toenails and calluses bilaterally.  Objective: Vital signs stable alert and oriented 3. Pulses are palpable. Neurologic sensorium is intact. Deep tendon reflexes are intact muscle strength is normal. She does have multiple reactive hyperkeratosis plantar aspect bilateral foot and toes. Otherwise toenails are thick yellow dystrophic with mycotic and painful palpation as well as debridement.  Assessment: Porokeratosis bilateral. Pain limited secondary to onychomycosis bilateral.  Plan: Remove all reactive hyperkeratotic lesions bilaterally debridement of toenails 1 through 5 bilateral.

## 2016-03-31 DIAGNOSIS — Z85828 Personal history of other malignant neoplasm of skin: Secondary | ICD-10-CM | POA: Insufficient documentation

## 2016-04-03 ENCOUNTER — Encounter (HOSPITAL_COMMUNITY)
Admission: RE | Admit: 2016-04-03 | Discharge: 2016-04-03 | Disposition: A | Discharge status: Home / Self Care | Payer: Medicare Other | Source: Ambulatory Visit | Attending: Cardiology | Admitting: Cardiology

## 2016-04-03 ENCOUNTER — Ambulatory Visit (HOSPITAL_COMMUNITY)
Admission: RE | Admit: 2016-04-03 | Discharge: 2016-04-03 | Disposition: A | Discharge status: Home / Self Care | Payer: Medicare Other | Source: Ambulatory Visit | Attending: Cardiology | Admitting: Cardiology

## 2016-04-03 DIAGNOSIS — R079 Chest pain, unspecified: Secondary | ICD-10-CM | POA: Diagnosis present

## 2016-04-03 MED ORDER — REGADENOSON 0.4 MG/5ML IV SOLN
INTRAVENOUS | Status: AC
  Filled 2016-04-03: qty 5

## 2016-04-03 MED ORDER — NITROGLYCERIN 0.4 MG SL SUBL
SUBLINGUAL_TABLET | SUBLINGUAL | Status: AC
  Filled 2016-04-03: qty 1

## 2016-04-03 MED ORDER — NITROGLYCERIN 0.4 MG SL SUBL
0.4000 mg | SUBLINGUAL_TABLET | SUBLINGUAL | Status: DC | PRN
  Administered 2016-04-03: 0.4 mg via SUBLINGUAL

## 2016-04-03 MED ORDER — REGADENOSON 0.4 MG/5ML IV SOLN
0.4000 mg | Freq: Once | INTRAVENOUS | Status: AC
  Administered 2016-04-03: 0.4 mg via INTRAVENOUS

## 2016-04-03 MED ORDER — TECHNETIUM TC 99M TETROFOSMIN IV KIT
10.0000 | PACK | Freq: Once | INTRAVENOUS | Status: AC | PRN
  Administered 2016-04-03: 10 via INTRAVENOUS

## 2016-04-03 MED ORDER — TECHNETIUM TC 99M TETROFOSMIN IV KIT
30.0000 | PACK | Freq: Once | INTRAVENOUS | Status: AC | PRN
  Administered 2016-04-03: 30 via INTRAVENOUS

## 2016-06-28 ENCOUNTER — Ambulatory Visit (INDEPENDENT_AMBULATORY_CARE_PROVIDER_SITE_OTHER): Payer: Medicare Other | Admitting: Podiatry

## 2016-06-28 ENCOUNTER — Encounter: Payer: Self-pay | Admitting: Podiatry

## 2016-06-28 DIAGNOSIS — M79676 Pain in unspecified toe(s): Secondary | ICD-10-CM

## 2016-06-28 DIAGNOSIS — Q828 Other specified congenital malformations of skin: Secondary | ICD-10-CM | POA: Diagnosis not present

## 2016-06-28 DIAGNOSIS — B351 Tinea unguium: Secondary | ICD-10-CM

## 2016-06-28 NOTE — Progress Notes (Signed)
She presents today chief complaint of painful elongated toenails and calluses bilateral.  Objective: Pulses remain palpable bilateral. Thick calluses plantar aspect of the forefoot bilateral distal aspect of the toes are noted. She also has thick yellow dystrophic with mycotic nails. No open lesions or wounds are noted.  Assessment: Pain in limb secondary to onychomycosis and porokeratosis.  Plan: Debridement of toenails and calluses bilateral. Follow up with her in 3 months

## 2016-09-13 ENCOUNTER — Other Ambulatory Visit: Payer: Self-pay | Admitting: Internal Medicine

## 2016-09-13 DIAGNOSIS — Z1231 Encounter for screening mammogram for malignant neoplasm of breast: Secondary | ICD-10-CM

## 2016-09-27 ENCOUNTER — Ambulatory Visit: Payer: Medicare Other | Admitting: Podiatry

## 2016-10-05 ENCOUNTER — Ambulatory Visit
Admission: RE | Admit: 2016-10-05 | Discharge: 2016-10-05 | Disposition: A | Discharge status: Home / Self Care | Payer: Medicare Other | Source: Ambulatory Visit | Attending: Internal Medicine | Admitting: Internal Medicine

## 2016-10-05 DIAGNOSIS — Z1231 Encounter for screening mammogram for malignant neoplasm of breast: Secondary | ICD-10-CM | POA: Diagnosis not present

## 2016-10-20 ENCOUNTER — Other Ambulatory Visit: Payer: Self-pay | Admitting: Neurology

## 2016-10-20 DIAGNOSIS — R42 Dizziness and giddiness: Secondary | ICD-10-CM

## 2016-10-20 DIAGNOSIS — R269 Unspecified abnormalities of gait and mobility: Secondary | ICD-10-CM

## 2016-11-02 ENCOUNTER — Ambulatory Visit: Payer: Medicare Other

## 2016-11-03 ENCOUNTER — Ambulatory Visit
Admission: RE | Admit: 2016-11-03 | Discharge: 2016-11-03 | Disposition: A | Discharge status: Home / Self Care | Payer: Medicare Other | Source: Ambulatory Visit | Attending: Neurology | Admitting: Neurology

## 2016-11-03 DIAGNOSIS — R9089 Other abnormal findings on diagnostic imaging of central nervous system: Secondary | ICD-10-CM | POA: Diagnosis not present

## 2016-11-03 DIAGNOSIS — R42 Dizziness and giddiness: Secondary | ICD-10-CM | POA: Diagnosis not present

## 2016-11-03 DIAGNOSIS — R269 Unspecified abnormalities of gait and mobility: Secondary | ICD-10-CM | POA: Insufficient documentation

## 2016-11-29 ENCOUNTER — Encounter: Payer: Self-pay | Admitting: *Deleted

## 2016-11-29 NOTE — Patient Instructions (Signed)
Your procedure is scheduled on:  Report to Same Day Surgery 2nd floor medical mall [facility] Entrance-take elevator on left to 2nd floor.  Check in with surgery information desk.) To find out your arrival time please call [phone] between 1PM - 3PM on   Remember: Instructions that are not followed completely may result in serious medical risk, up to and including death, or upon the discretion of your surgeon and anesthesiologist your surgery may need to be rescheduled.    _x___ 1. Do not eat food or drink liquids after midnight. No gum chewing or                              hard candies.     __x__ 2. No Alcohol for 24 hours before or after surgery.   __x__3. No Smoking for 24 prior to surgery.   ____  4. Bring all medications with you on the day of surgery if instructed.    __x__ 5. Notify your doctor if there is any change in your medical condition     (cold, fever, infections).     Do not wear jewelry, make-up, hairpins, clips or nail polish.  Do not wear lotions, powders, or perfumes. You may wear deodorant.  Do not shave 48 hours prior to surgery. Men may shave face and neck.  Do not bring valuables to the hospital.    [facility] is not responsible for any belongings or valuables.               Contacts, dentures or bridgework may not be worn into surgery.  Leave your suitcase in the car. After surgery it may be brought to your room.  For patients admitted to the hospital, discharge time is determined by your                       treatment team.   Patients discharged the day of surgery will not be allowed to drive home.  You will need someone to drive you home and stay with you the night of your procedure.    Please read over the following fact sheets that you were given:   [facility] Preparing for Surgery and or MRSA Information   _x___ Take anti-hypertensive (unless it includes a diuretic), cardiac, seizure, asthma,     anti-reflux and psychiatric medicines.  These include:  1.   2.  3.  4.  5.  6.  ____Fleets enema or Magnesium Citrate as directed.   _x___ Use CHG Soap or sage wipes as directed on instruction sheet   ____ Use inhalers on the day of surgery and bring to hospital day of surgery  ____ Stop Metformin and Janumet 2 days prior to surgery.    ____ Take 1/2 of usual insulin dose the night before surgery and none on the morning     surgery.   _x___ Follow recommendations from Cardiologist, Pulmonologist or PCP regarding          stopping Aspirin, Coumadin, Pllavix ,Eliquis, Effient, or Pradaxa, and Pletal.  X____Stop Anti-inflammatories such as Advil, Aleve, Ibuprofen, Motrin, Naproxen, Naprosyn, Goodies powders or aspirin products. OK to take Tylenol and                          Celebrex.   _x___ Stop supplements until after surgery.  But may continue Vitamin D, Vitamin B,  and multivitamin.   ____ Bring C-Pap to the hospital.

## 2016-12-07 ENCOUNTER — Ambulatory Visit
Admission: RE | Admit: 2016-12-07 | Discharge: 2016-12-07 | Disposition: A | Discharge status: Home / Self Care | Payer: Medicare Other | Source: Ambulatory Visit | Attending: Ophthalmology | Admitting: Ophthalmology

## 2016-12-07 ENCOUNTER — Ambulatory Visit: Payer: Medicare Other | Admitting: Anesthesiology

## 2016-12-07 ENCOUNTER — Encounter: Payer: Self-pay | Admitting: *Deleted

## 2016-12-07 ENCOUNTER — Encounter
Admission: RE | Disposition: A | Discharge status: Home / Self Care | Payer: Self-pay | Source: Ambulatory Visit | Attending: Ophthalmology

## 2016-12-07 DIAGNOSIS — I739 Peripheral vascular disease, unspecified: Secondary | ICD-10-CM | POA: Diagnosis not present

## 2016-12-07 DIAGNOSIS — H2512 Age-related nuclear cataract, left eye: Secondary | ICD-10-CM | POA: Insufficient documentation

## 2016-12-07 DIAGNOSIS — I10 Essential (primary) hypertension: Secondary | ICD-10-CM | POA: Insufficient documentation

## 2016-12-07 DIAGNOSIS — Z79899 Other long term (current) drug therapy: Secondary | ICD-10-CM | POA: Diagnosis not present

## 2016-12-07 DIAGNOSIS — I252 Old myocardial infarction: Secondary | ICD-10-CM | POA: Insufficient documentation

## 2016-12-07 DIAGNOSIS — I2510 Atherosclerotic heart disease of native coronary artery without angina pectoris: Secondary | ICD-10-CM | POA: Diagnosis not present

## 2016-12-07 DIAGNOSIS — Z7982 Long term (current) use of aspirin: Secondary | ICD-10-CM | POA: Diagnosis not present

## 2016-12-07 HISTORY — DX: Atherosclerotic heart disease of native coronary artery without angina pectoris: I25.10

## 2016-12-07 HISTORY — PX: CATARACT EXTRACTION W/PHACO: SHX586

## 2016-12-07 HISTORY — DX: Acute myocardial infarction, unspecified: I21.9

## 2016-12-07 SURGERY — PHACOEMULSIFICATION, CATARACT, WITH IOL INSERTION
Anesthesia: Monitor Anesthesia Care | Site: Eye | Laterality: Left | Wound class: Clean

## 2016-12-07 MED ORDER — POVIDONE-IODINE 5 % OP SOLN
OPHTHALMIC | Status: AC
  Filled 2016-12-07: qty 30

## 2016-12-07 MED ORDER — LIDOCAINE HCL (PF) 4 % IJ SOLN
INTRAOCULAR | Status: DC | PRN
  Administered 2016-12-07: 4 mL via OPHTHALMIC

## 2016-12-07 MED ORDER — CARBACHOL 0.01 % IO SOLN
INTRAOCULAR | Status: DC | PRN
  Administered 2016-12-07: 0.5 mL via INTRAOCULAR

## 2016-12-07 MED ORDER — EPINEPHRINE PF 1 MG/ML IJ SOLN
INTRAMUSCULAR | Status: AC
  Filled 2016-12-07: qty 2

## 2016-12-07 MED ORDER — ARMC OPHTHALMIC DILATING DROPS
OPHTHALMIC | Status: AC
  Filled 2016-12-07: qty 0.4

## 2016-12-07 MED ORDER — MIDAZOLAM HCL 5 MG/5ML IJ SOLN
INTRAMUSCULAR | Status: DC | PRN
  Administered 2016-12-07: 1 mg via INTRAVENOUS

## 2016-12-07 MED ORDER — SODIUM CHLORIDE 0.9 % IV SOLN
INTRAVENOUS | Status: DC
  Administered 2016-12-07 (×2): via INTRAVENOUS

## 2016-12-07 MED ORDER — MOXIFLOXACIN HCL 0.5 % OP SOLN
OPHTHALMIC | Status: AC
  Filled 2016-12-07: qty 3

## 2016-12-07 MED ORDER — EPINEPHRINE PF 1 MG/ML IJ SOLN
INTRAOCULAR | Status: DC | PRN
  Administered 2016-12-07: 10:00:00 via OPHTHALMIC

## 2016-12-07 MED ORDER — POVIDONE-IODINE 5 % OP SOLN
OPHTHALMIC | Status: DC | PRN
  Administered 2016-12-07: 1 via OPHTHALMIC

## 2016-12-07 MED ORDER — MOXIFLOXACIN HCL 0.5 % OP SOLN
1.0000 [drp] | OPHTHALMIC | Status: AC
  Administered 2016-12-07 (×3): 1 [drp] via OPHTHALMIC

## 2016-12-07 MED ORDER — NA HYALUR & NA CHOND-NA HYALUR 0.4-0.35 ML IO KIT
PACK | INTRAOCULAR | Status: DC | PRN
  Administered 2016-12-07: .35 mL via INTRAOCULAR

## 2016-12-07 MED ORDER — NA HYALUR & NA CHOND-NA HYALUR 0.55-0.5 ML IO KIT
PACK | INTRAOCULAR | Status: AC
  Filled 2016-12-07: qty 1.05

## 2016-12-07 MED ORDER — MIDAZOLAM HCL 2 MG/2ML IJ SOLN
INTRAMUSCULAR | Status: AC
  Filled 2016-12-07: qty 2

## 2016-12-07 MED ORDER — LIDOCAINE HCL (PF) 4 % IJ SOLN
INTRAMUSCULAR | Status: AC
  Filled 2016-12-07: qty 5

## 2016-12-07 MED ORDER — NEOMYCIN-POLYMYXIN-DEXAMETH 0.1 % OP OINT
TOPICAL_OINTMENT | OPHTHALMIC | Status: DC | PRN
  Administered 2016-12-07: 1 via OPHTHALMIC

## 2016-12-07 MED ORDER — NEOMYCIN-POLYMYXIN-DEXAMETH 3.5-10000-0.1 OP OINT
TOPICAL_OINTMENT | OPHTHALMIC | Status: AC
  Filled 2016-12-07: qty 3.5

## 2016-12-07 MED ORDER — ARMC OPHTHALMIC DILATING DROPS
1.0000 "application " | OPHTHALMIC | Status: AC
  Administered 2016-12-07 (×3): 1 via OPHTHALMIC

## 2016-12-07 SURGICAL SUPPLY — 16 items
DRAIN EYE SPONGE 80CC SGL (MISCELLANEOUS) ×3 IMPLANT
GLOVE BIO SURGEON STRL SZ8 (GLOVE) ×3 IMPLANT
GLOVE BIOGEL M 6.5 STRL (GLOVE) ×3 IMPLANT
GLOVE SURG LX 7.5 STRW (GLOVE) ×2
GLOVE SURG LX STRL 7.5 STRW (GLOVE) ×1 IMPLANT
GOWN STRL REUS W/ TWL LRG LVL3 (GOWN DISPOSABLE) ×2 IMPLANT
GOWN STRL REUS W/TWL LRG LVL3 (GOWN DISPOSABLE) ×4
LENS IOL TECNIS ITEC 17.0 (Intraocular Lens) ×3 IMPLANT
PACK CATARACT (MISCELLANEOUS) ×3 IMPLANT
PACK CATARACT BRASINGTON LX (MISCELLANEOUS) ×3 IMPLANT
PACK EYE AFTER SURG (MISCELLANEOUS) ×3 IMPLANT
SOL BSS BAG (MISCELLANEOUS) ×3
SOLUTION BSS BAG (MISCELLANEOUS) ×1 IMPLANT
SYR 5ML LL (SYRINGE) ×3 IMPLANT
WATER STERILE IRR 250ML POUR (IV SOLUTION) ×3 IMPLANT
WIPE NON LINTING 3.25X3.25 (MISCELLANEOUS) ×3 IMPLANT

## 2016-12-07 NOTE — Anesthesia Post-op Follow-up Note (Cosign Needed)
Anesthesia QCDR form completed.        

## 2016-12-07 NOTE — Anesthesia Preprocedure Evaluation (Addendum)
Anesthesia Evaluation  Patient identified by MRN, date of birth, ID band Patient awake    Reviewed: Allergy & Precautions, NPO status , Patient's Chart, lab work & pertinent test results, reviewed documented beta blocker date and time   Airway Mallampati: II  TM Distance: >3 FB     Dental  (+) Chipped   Pulmonary           Cardiovascular hypertension, Pt. on medications and Pt. on home beta blockers + CAD, + Past MI, + Cardiac Stents and + Peripheral Vascular Disease       Neuro/Psych  Headaches,    GI/Hepatic   Endo/Other    Renal/GU      Musculoskeletal  (+) Arthritis ,   Abdominal   Peds  Hematology   Anesthesia Other Findings   Reproductive/Obstetrics                            Anesthesia Physical Anesthesia Plan  ASA: III  Anesthesia Plan: MAC   Post-op Pain Management:    Induction:   PONV Risk Score and Plan:   Airway Management Planned:   Additional Equipment:   Intra-op Plan:   Post-operative Plan:   Informed Consent: I have reviewed the patients History and Physical, chart, labs and discussed the procedure including the risks, benefits and alternatives for the proposed anesthesia with the patient or authorized representative who has indicated his/her understanding and acceptance.     Plan Discussed with: CRNA  Anesthesia Plan Comments:         Anesthesia Quick Evaluation

## 2016-12-07 NOTE — H&P (Signed)
The History and Physical notes are on paper, have been signed, and are to be scanned. The patient remains stable and unchanged from the H&P.   Previous H&P reviewed, patient examined, and there are no changes.  Kristi Norment 12/07/2016 9:00 AM

## 2016-12-07 NOTE — Anesthesia Postprocedure Evaluation (Signed)
Anesthesia Post Note  Patient: Mandy Gilbert  Procedure(s) Performed: Procedure(s) (LRB): CATARACT EXTRACTION PHACO AND INTRAOCULAR LENS PLACEMENT (IOC) (Left)  Patient location during evaluation: PACU Anesthesia Type: MAC Level of consciousness: awake and alert Pain management: pain level controlled Vital Signs Assessment: post-procedure vital signs reviewed and stable Respiratory status: spontaneous breathing, nonlabored ventilation and respiratory function stable Cardiovascular status: stable and blood pressure returned to baseline Anesthetic complications: no     Last Vitals:  Vitals:   12/07/16 0852 12/07/16 1024  BP: (!) 160/79 (!) 145/71  Pulse: 64 64  Resp: 18   Temp: 36.7 C     Last Pain:  Vitals:   12/07/16 0852  TempSrc: Oral                 Silvana Newness A

## 2016-12-07 NOTE — Op Note (Signed)
OPERATIVE NOTE  Mandy Gilbert [id number] 12/07/2016   PREOPERATIVE DIAGNOSIS:  Nuclear sclerotic cataract left eye. H25.12   POSTOPERATIVE DIAGNOSIS:    Nuclear sclerotic cataract left eye.     PROCEDURE:  Phacoemusification with posterior chamber intraocular lens placement of the left eye   LENS:   Implant Name Type Inv. Item Serial No. Manufacturer Lot No. LRB No. Used  LENS IOL DIOP 17.0 - [id number] Intraocular Lens LENS IOL DIOP 17.0 [id number] AMO   Left 1        ULTRASOUND TIME: 16 % of 0 minutes, 44 seconds.  CDE 7.1   SURGEON:  Wyonia Hough, MD   ANESTHESIA:  Topical with tetracaine drops and 2% Xylocaine jelly, augmented with 1% preservative-free intracameral lidocaine.    COMPLICATIONS:  None.   DESCRIPTION OF PROCEDURE:  The patient was identified in the holding room and transported to the operating room and placed in the supine position under the operating microscope.  The left eye was identified as the operative eye and it was prepped and draped in the usual sterile ophthalmic fashion.   A 1 millimeter clear-corneal paracentesis was made at the 1:30 position. 0.5 ml of preservative-free 1% lidocaine was injected into the anterior chamber.  The anterior chamber was filled with Viscoat viscoelastic.  A 2.4 millimeter keratome was used to make a near-clear corneal incision at the 10:30 position.  .  A curvilinear capsulorrhexis was made with a cystotome and capsulorrhexis forceps.  Balanced salt solution was used to hydrodissect and hydrodelineate the nucleus.   Phacoemulsification was then used in stop and chop fashion to remove the lens nucleus and epinucleus.  The remaining cortex was then removed using the irrigation and aspiration handpiece. Provisc was then placed into the capsular bag to distend it for lens placement.  A lens was then injected into the capsular bag.  The remaining viscoelastic was aspirated.   Wounds were hydrated with balanced salt  solution.  The anterior chamber was inflated to a physiologic pressure with balanced salt solution. Vigamox 0.2 ml of a 1mg  per ml solution was injected into the anterior chamber for a dose of 0.2 mg of intracameral antibiotic at the completion of the case.  Miostat was placed into the anterior chamber to constrict the pupil.  No wound leaks were noted.  Topical Vigamox drops and Maxitrol ointment were applied to the eye.  The patient was taken to the recovery room in stable condition without complications of anesthesia or surgery  Mandy Gilbert 12/07/2016, 10:22 AM

## 2016-12-07 NOTE — Discharge Instructions (Signed)
Eye Surgery Discharge Instructions  Expect mild scratchy sensation or mild soreness. DO NOT RUB YOUR EYE!  The day of surgery:  Minimal physical activity, but bed rest is not required  No reading, computer work, or close hand work  No bending, lifting, or straining.  May watch TV  For 24 hours:  No driving, legal decisions, or alcoholic beverages  Safety precautions  Eat anything you prefer: It is better to start with liquids, then soup then solid foods.  _____ Eye patch should be worn until postoperative exam tomorrow.  ____ Solar shield eyeglasses should be worn for comfort in the sunlight/patch while sleeping  Resume all regular medications including aspirin or Coumadin if these were discontinued prior to surgery. You may shower, bathe, shave, or wash your hair. Tylenol may be taken for mild discomfort.  Call your doctor if you experience significant pain, nausea, or vomiting, fever > 101 or other signs of infection. [phone] or [phone] Specific instructions:  Follow-up Information    Leandrew Koyanagi, MD. Go on 12/08/2016.   Specialty:  Ophthalmology Why:  Appointment time is set for 10:05 am TOMORROW.  Contact information: [address]   [place] [address] [phone]

## 2016-12-07 NOTE — Transfer of Care (Signed)
Immediate Anesthesia Transfer of Care Note  Patient: Mandy Gilbert  Procedure(s) Performed: Procedure(s) with comments: CATARACT EXTRACTION PHACO AND INTRAOCULAR LENS PLACEMENT (IOC) (Left) - [place] 00:44.3 AP% 16.1 CDE 7.11 Fluid Pack lot # [id number]  Patient Location: PACU  Anesthesia Type:MAC  Level of Consciousness: awake, alert , oriented and patient cooperative  Airway & Oxygen Therapy: Patient Spontanous Breathing  Post-op Assessment: Report given to RN, Post -op Vital signs reviewed and stable and Patient moving all extremities X 4  Post vital signs: Reviewed and stable  Last Vitals:  Vitals:   12/07/16 0852 12/07/16 1024  BP: (!) 160/79 (!) 145/71  Pulse: 64 64  Resp: 18   Temp: 36.7 C     Last Pain:  Vitals:   12/07/16 0852  TempSrc: Oral         Complications: No apparent anesthesia complications

## 2016-12-08 ENCOUNTER — Encounter: Payer: Self-pay | Admitting: Ophthalmology

## 2017-01-04 ENCOUNTER — Encounter: Payer: Self-pay | Admitting: *Deleted

## 2017-01-11 ENCOUNTER — Ambulatory Visit: Payer: Medicare Other | Admitting: Anesthesiology

## 2017-01-11 ENCOUNTER — Encounter: Payer: Self-pay | Admitting: *Deleted

## 2017-01-11 ENCOUNTER — Ambulatory Visit
Admission: RE | Admit: 2017-01-11 | Discharge: 2017-01-11 | Disposition: A | Discharge status: Home / Self Care | Payer: Medicare Other | Source: Ambulatory Visit | Attending: Ophthalmology | Admitting: Ophthalmology

## 2017-01-11 ENCOUNTER — Encounter
Admission: RE | Disposition: A | Discharge status: Home / Self Care | Payer: Self-pay | Source: Ambulatory Visit | Attending: Ophthalmology

## 2017-01-11 DIAGNOSIS — H2511 Age-related nuclear cataract, right eye: Secondary | ICD-10-CM | POA: Diagnosis not present

## 2017-01-11 DIAGNOSIS — Z79899 Other long term (current) drug therapy: Secondary | ICD-10-CM | POA: Diagnosis not present

## 2017-01-11 DIAGNOSIS — Z955 Presence of coronary angioplasty implant and graft: Secondary | ICD-10-CM | POA: Insufficient documentation

## 2017-01-11 DIAGNOSIS — I252 Old myocardial infarction: Secondary | ICD-10-CM | POA: Diagnosis not present

## 2017-01-11 DIAGNOSIS — I739 Peripheral vascular disease, unspecified: Secondary | ICD-10-CM | POA: Diagnosis not present

## 2017-01-11 DIAGNOSIS — I10 Essential (primary) hypertension: Secondary | ICD-10-CM | POA: Diagnosis not present

## 2017-01-11 DIAGNOSIS — Z85828 Personal history of other malignant neoplasm of skin: Secondary | ICD-10-CM | POA: Insufficient documentation

## 2017-01-11 DIAGNOSIS — Z7982 Long term (current) use of aspirin: Secondary | ICD-10-CM | POA: Insufficient documentation

## 2017-01-11 DIAGNOSIS — Z9889 Other specified postprocedural states: Secondary | ICD-10-CM | POA: Diagnosis not present

## 2017-01-11 DIAGNOSIS — Z882 Allergy status to sulfonamides status: Secondary | ICD-10-CM | POA: Insufficient documentation

## 2017-01-11 DIAGNOSIS — E7800 Pure hypercholesterolemia, unspecified: Secondary | ICD-10-CM | POA: Insufficient documentation

## 2017-01-11 DIAGNOSIS — I2510 Atherosclerotic heart disease of native coronary artery without angina pectoris: Secondary | ICD-10-CM | POA: Diagnosis not present

## 2017-01-11 HISTORY — PX: CATARACT EXTRACTION W/PHACO: SHX586

## 2017-01-11 HISTORY — DX: Edema, unspecified: R60.9

## 2017-01-11 SURGERY — PHACOEMULSIFICATION, CATARACT, WITH IOL INSERTION
Anesthesia: Monitor Anesthesia Care | Site: Eye | Laterality: Right | Wound class: Clean

## 2017-01-11 MED ORDER — MIDAZOLAM HCL 2 MG/2ML IJ SOLN
INTRAMUSCULAR | Status: DC | PRN
  Administered 2017-01-11 (×2): 0.5 mg via INTRAVENOUS

## 2017-01-11 MED ORDER — EPINEPHRINE PF 1 MG/ML IJ SOLN
INTRAMUSCULAR | Status: DC | PRN
  Administered 2017-01-11: 200 mL via OPHTHALMIC

## 2017-01-11 MED ORDER — NA HYALUR & NA CHOND-NA HYALUR 0.55-0.5 ML IO KIT
PACK | INTRAOCULAR | Status: AC
  Filled 2017-01-11: qty 1.05

## 2017-01-11 MED ORDER — NA HYALUR & NA CHOND-NA HYALUR 0.4-0.35 ML IO KIT
PACK | INTRAOCULAR | Status: DC | PRN
  Administered 2017-01-11: .35 mL via INTRAOCULAR

## 2017-01-11 MED ORDER — ARMC OPHTHALMIC DILATING DROPS
1.0000 "application " | OPHTHALMIC | Status: AC
  Administered 2017-01-11 (×3): 1 via OPHTHALMIC

## 2017-01-11 MED ORDER — LIDOCAINE HCL (PF) 4 % IJ SOLN
INTRAMUSCULAR | Status: AC
  Filled 2017-01-11: qty 5

## 2017-01-11 MED ORDER — MIDAZOLAM HCL 2 MG/2ML IJ SOLN
INTRAMUSCULAR | Status: AC
  Filled 2017-01-11: qty 2

## 2017-01-11 MED ORDER — SODIUM CHLORIDE 0.9 % IV SOLN
INTRAVENOUS | Status: DC
  Administered 2017-01-11 (×2): via INTRAVENOUS

## 2017-01-11 MED ORDER — EPINEPHRINE PF 1 MG/ML IJ SOLN
INTRAMUSCULAR | Status: AC
  Filled 2017-01-11: qty 1

## 2017-01-11 MED ORDER — POVIDONE-IODINE 5 % OP SOLN
OPHTHALMIC | Status: AC
  Filled 2017-01-11: qty 30

## 2017-01-11 MED ORDER — MOXIFLOXACIN HCL 0.5 % OP SOLN
OPHTHALMIC | Status: AC
  Administered 2017-01-11: 1 [drp] via OPHTHALMIC
  Filled 2017-01-11: qty 3

## 2017-01-11 MED ORDER — ALFENTANIL 500 MCG/ML IJ INJ
INJECTION | INTRAVENOUS | Status: DC | PRN
  Administered 2017-01-11: 250 ug via INTRAVENOUS

## 2017-01-11 MED ORDER — POVIDONE-IODINE 5 % OP SOLN
OPHTHALMIC | Status: DC | PRN
  Administered 2017-01-11: 1 via OPHTHALMIC

## 2017-01-11 MED ORDER — NEOMYCIN-POLYMYXIN-DEXAMETH 3.5-10000-0.1 OP OINT
TOPICAL_OINTMENT | OPHTHALMIC | Status: AC
  Filled 2017-01-11: qty 3.5

## 2017-01-11 MED ORDER — ARMC OPHTHALMIC DILATING DROPS
OPHTHALMIC | Status: AC
  Administered 2017-01-11: 1 via OPHTHALMIC
  Filled 2017-01-11: qty 0.4

## 2017-01-11 MED ORDER — CARBACHOL 0.01 % IO SOLN
INTRAOCULAR | Status: DC | PRN
  Administered 2017-01-11: 0.5 mL via INTRAOCULAR

## 2017-01-11 MED ORDER — MOXIFLOXACIN HCL 0.5 % OP SOLN
1.0000 [drp] | OPHTHALMIC | Status: AC | PRN
  Administered 2017-01-11 (×3): 1 [drp] via OPHTHALMIC

## 2017-01-11 MED ORDER — LIDOCAINE HCL (PF) 4 % IJ SOLN
INTRAOCULAR | Status: DC | PRN
  Administered 2017-01-11: 4 mL via OPHTHALMIC

## 2017-01-11 MED ORDER — NEOMYCIN-POLYMYXIN-DEXAMETH 0.1 % OP OINT
TOPICAL_OINTMENT | OPHTHALMIC | Status: DC | PRN
  Administered 2017-01-11: 1 via OPHTHALMIC

## 2017-01-11 SURGICAL SUPPLY — 16 items
GLOVE BIO SURGEON STRL SZ8 (GLOVE) ×3 IMPLANT
GLOVE BIOGEL M 6.5 STRL (GLOVE) ×3 IMPLANT
GLOVE SURG LX 7.5 STRW (GLOVE) ×2
GLOVE SURG LX STRL 7.5 STRW (GLOVE) ×1 IMPLANT
GOWN STRL REUS W/ TWL LRG LVL3 (GOWN DISPOSABLE) ×2 IMPLANT
GOWN STRL REUS W/TWL LRG LVL3 (GOWN DISPOSABLE) ×4
LABEL CATARACT MEDS ST (LABEL) ×3 IMPLANT
LENS IOL TECNIS ITEC 17.0 (Intraocular Lens) ×3 IMPLANT
PACK CATARACT (MISCELLANEOUS) ×3 IMPLANT
PACK CATARACT BRASINGTON LX (MISCELLANEOUS) ×3 IMPLANT
PACK EYE AFTER SURG (MISCELLANEOUS) ×3 IMPLANT
SOL BSS BAG (MISCELLANEOUS) ×3
SOLUTION BSS BAG (MISCELLANEOUS) ×1 IMPLANT
SYR 5ML LL (SYRINGE) ×3 IMPLANT
WATER STERILE IRR 250ML POUR (IV SOLUTION) ×3 IMPLANT
WIPE NON LINTING 3.25X3.25 (MISCELLANEOUS) ×3 IMPLANT

## 2017-01-11 NOTE — H&P (Signed)
The History and Physical notes are on paper, have been signed, and are to be scanned. The patient remains stable and unchanged from the H&P.   Previous H&P reviewed, patient examined, and there are no changes.  Mandy Gilbert 01/11/2017 8:10 AM

## 2017-01-11 NOTE — Op Note (Signed)
OPERATIVE NOTE  KABAO LEITE [id number] 01/11/2017   PREOPERATIVE DIAGNOSIS:  Nuclear Sclerotic Cataract Right Eye H25.11   POSTOPERATIVE DIAGNOSIS: Nuclear Sclerotic Cataract Right Eye H25.11          PROCEDURE:  Phacoemusification with posterior chamber intraocular lens placement of the right eye   LENS:   Implant Name Type Inv. Item Serial No. Manufacturer Lot No. LRB No. Used  LENS IOL DIOP 17.0 - [id number] Intraocular Lens LENS IOL DIOP 17.0 [id number] AMO   Right 1       ULTRASOUND TIME: 18 %  of 0 minutes 47 seconds, CDE 8.6  SURGEON:  Wyonia Hough, MD   ANESTHESIA:  Topical with tetracaine drops and 2% Xylocaine jelly, augmented with 1% preservative-free intracameral lidocaine.    COMPLICATIONS:  None.   DESCRIPTION OF PROCEDURE:  The patient was identified in the holding room and transported to the operating room and placed in the supine position under the operating microscope. Theright eye was identified as the operative eye and it was prepped and draped in the usual sterile ophthalmic fashion.   A 1 millimeter clear-corneal paracentesis was made at the 12:00 position.  0.5 ml of preservative-free 1% lidocaine was injected into the anterior chamber. The anterior chamber was filled with Viscoat viscoelastic.  A 2.4 millimeter keratome was used to make a near-clear corneal incision at the 9:00 position. A curvilinear capsulorrhexis was made with a cystotome and capsulorrhexis forceps.  Balanced salt solution was used to hydrodissect and hydrodelineate the nucleus.   Phacoemulsification was then used in stop and chop fashion to remove the lens nucleus and epinucleus.  The remaining cortex was then removed using the irrigation and aspiration handpiece. Provisc was then placed into the capsular bag to distend it for lens placement.  A lens was then injected into the capsular bag.  The remaining viscoelastic was aspirated.  Wounds were hydrated with balanced salt  solution.  The anterior chamber was inflated to a physiologic pressure with balanced salt solution. Vigamox 0.2 ml of a 1mg  per ml solution was injected into the anterior chamber for a dose of 0.2 mg of intracameral antibiotic at the completion of the case. Miostat was placed into the anterior chamber to constrict the pupil.  No wound leaks were noted.  Topical Vigamox drops and Maxitrol ointment were applied to the eye.  The patient was taken to the recovery room in stable condition without complications of anesthesia or surgery.  Ishani Goldwasser 01/11/2017, 9:37 AM

## 2017-01-11 NOTE — Anesthesia Postprocedure Evaluation (Signed)
Anesthesia Post Note  Patient: SHERE EISENHART  Procedure(s) Performed: Procedure(s) (LRB): CATARACT EXTRACTION PHACO AND INTRAOCULAR LENS PLACEMENT (IOC) (Right)  Patient location during evaluation: PACU Anesthesia Type: MAC Level of consciousness: awake and alert Pain management: pain level controlled Vital Signs Assessment: post-procedure vital signs reviewed and stable Respiratory status: spontaneous breathing, nonlabored ventilation, respiratory function stable and patient connected to nasal cannula oxygen Cardiovascular status: stable and blood pressure returned to baseline Anesthetic complications: no     Last Vitals:  Vitals:   01/11/17 0940 01/11/17 0941  BP: 130/70 130/70  Pulse: (!) 59 (!) 57  Resp: 16 16  Temp: 36.6 C 36.6 C  SpO2: 100%     Last Pain:  Vitals:   01/11/17 0941  TempSrc: Oral                 Precious Haws Tova Vater

## 2017-01-11 NOTE — Anesthesia Post-op Follow-up Note (Signed)
Anesthesia QCDR form completed.        

## 2017-01-11 NOTE — Discharge Instructions (Signed)
Eye Surgery Discharge Instructions  Expect mild scratchy sensation or mild soreness. DO NOT RUB YOUR EYE!  The day of surgery:  Minimal physical activity, but bed rest is not required  No reading, computer work, or close hand work  No bending, lifting, or straining.  May watch TV  For 24 hours:  No driving, legal decisions, or alcoholic beverages  Safety precautions  Eat anything you prefer: It is better to start with liquids, then soup then solid foods.  _____ Eye patch should be worn until postoperative exam tomorrow.  ____ Solar shield eyeglasses should be worn for comfort in the sunlight/patch while sleeping  Resume all regular medications including aspirin or Coumadin if these were discontinued prior to surgery. You may shower, bathe, shave, or wash your hair. Tylenol may be taken for mild discomfort.  Call your doctor if you experience significant pain, nausea, or vomiting, fever > 101 or other signs of infection. [phone] or [phone] Specific instructions:  Follow-up Information    Leandrew Koyanagi, MD Follow up.   Specialty:  Ophthalmology Why:  August 10 at 10:45am Contact information: [address]   [place] [address] [phone]

## 2017-01-11 NOTE — Transfer of Care (Signed)
Immediate Anesthesia Transfer of Care Note  Patient: Mandy Gilbert  Procedure(s) Performed: Procedure(s) with comments: CATARACT EXTRACTION PHACO AND INTRAOCULAR LENS PLACEMENT (IOC) (Right) - Lot # [id number] H [place]: 00:46.6 AP%: 18.4 CDE: 8.56  Patient Location: PACU and Short Stay  Anesthesia Type:MAC  Level of Consciousness: awake, oriented and patient cooperative  Airway & Oxygen Therapy: Patient Spontanous Breathing  Post-op Assessment: Report given to RN and Post -op Vital signs reviewed and stable  Post vital signs: Reviewed and stable  Last Vitals:  Vitals:   01/11/17 0755  BP: (!) 176/82  Pulse: 71  Resp: 16  Temp: 36.4 C  SpO2: 99%    Last Pain:  Vitals:   01/11/17 0755  TempSrc: Oral         Complications: No apparent anesthesia complications

## 2017-01-11 NOTE — Anesthesia Preprocedure Evaluation (Signed)
Anesthesia Evaluation  Patient identified by MRN, date of birth, ID band Patient awake    Reviewed: Allergy & Precautions, NPO status , Patient's Chart, lab work & pertinent test results, reviewed documented beta blocker date and time   Airway Mallampati: II  TM Distance: >3 FB     Dental  (+) Chipped   Pulmonary           Cardiovascular hypertension, Pt. on medications and Pt. on home beta blockers + CAD, + Past MI, + Cardiac Stents and + Peripheral Vascular Disease       Neuro/Psych  Headaches,    GI/Hepatic   Endo/Other    Renal/GU      Musculoskeletal  (+) Arthritis ,   Abdominal   Peds  Hematology   Anesthesia Other Findings Past Medical History: No date: Cancer [facility])     Comment:  Skin cancer legs, basal cell. No date: Constipation No date: Coronary artery disease No date: Edema     Comment:  ANKLE OCCAS No date: Hypertension No date: Myocardial infarction [facility])     Comment:  1/17   Reproductive/Obstetrics                             Anesthesia Physical  Anesthesia Plan  ASA: III  Anesthesia Plan: MAC   Post-op Pain Management:    Induction: Intravenous  PONV Risk Score and Plan:   Airway Management Planned: Natural Airway and Nasal Cannula  Additional Equipment:   Intra-op Plan:   Post-operative Plan:   Informed Consent: I have reviewed the patients History and Physical, chart, labs and discussed the procedure including the risks, benefits and alternatives for the proposed anesthesia with the patient or authorized representative who has indicated his/her understanding and acceptance.   Dental Advisory Given  Plan Discussed with: CRNA  Anesthesia Plan Comments: (Patient consented for risks of anesthesia including but not limited to:  - adverse reactions to medications - damage to teeth, lips or other oral mucosa - sore throat or hoarseness - Damage to  heart, brain, lungs or loss of life  Patient voiced understanding.)        Anesthesia Quick Evaluation

## 2017-09-04 ENCOUNTER — Other Ambulatory Visit: Payer: Self-pay | Admitting: Internal Medicine

## 2017-09-04 DIAGNOSIS — Z1231 Encounter for screening mammogram for malignant neoplasm of breast: Secondary | ICD-10-CM

## 2017-10-09 ENCOUNTER — Ambulatory Visit
Admission: RE | Admit: 2017-10-09 | Discharge: 2017-10-09 | Disposition: A | Discharge status: Home / Self Care | Payer: Medicare Other | Source: Ambulatory Visit | Attending: Internal Medicine | Admitting: Internal Medicine

## 2017-10-09 DIAGNOSIS — Z1231 Encounter for screening mammogram for malignant neoplasm of breast: Secondary | ICD-10-CM | POA: Insufficient documentation

## 2018-02-01 ENCOUNTER — Ambulatory Visit: Payer: Medicare Other | Admitting: Anesthesiology

## 2018-02-01 ENCOUNTER — Encounter: Payer: Self-pay | Admitting: Anesthesiology

## 2018-02-01 ENCOUNTER — Encounter
Admission: RE | Disposition: A | Discharge status: Home / Self Care | Payer: Self-pay | Source: Ambulatory Visit | Attending: Unknown Physician Specialty

## 2018-02-01 ENCOUNTER — Ambulatory Visit
Admission: RE | Admit: 2018-02-01 | Discharge: 2018-02-01 | Disposition: A | Discharge status: Home / Self Care | Payer: Medicare Other | Source: Ambulatory Visit | Attending: Unknown Physician Specialty | Admitting: Unknown Physician Specialty

## 2018-02-01 DIAGNOSIS — K640 First degree hemorrhoids: Secondary | ICD-10-CM | POA: Insufficient documentation

## 2018-02-01 DIAGNOSIS — I2510 Atherosclerotic heart disease of native coronary artery without angina pectoris: Secondary | ICD-10-CM | POA: Diagnosis not present

## 2018-02-01 DIAGNOSIS — I252 Old myocardial infarction: Secondary | ICD-10-CM | POA: Insufficient documentation

## 2018-02-01 DIAGNOSIS — D124 Benign neoplasm of descending colon: Secondary | ICD-10-CM | POA: Diagnosis not present

## 2018-02-01 DIAGNOSIS — Z85828 Personal history of other malignant neoplasm of skin: Secondary | ICD-10-CM | POA: Diagnosis not present

## 2018-02-01 DIAGNOSIS — Z79899 Other long term (current) drug therapy: Secondary | ICD-10-CM | POA: Insufficient documentation

## 2018-02-01 DIAGNOSIS — Z1211 Encounter for screening for malignant neoplasm of colon: Secondary | ICD-10-CM | POA: Diagnosis present

## 2018-02-01 DIAGNOSIS — Z7982 Long term (current) use of aspirin: Secondary | ICD-10-CM | POA: Insufficient documentation

## 2018-02-01 DIAGNOSIS — I10 Essential (primary) hypertension: Secondary | ICD-10-CM | POA: Insufficient documentation

## 2018-02-01 HISTORY — PX: COLONOSCOPY WITH PROPOFOL: SHX5780

## 2018-02-01 SURGERY — COLONOSCOPY WITH PROPOFOL
Anesthesia: General

## 2018-02-01 MED ORDER — SODIUM CHLORIDE 0.9 % IV SOLN: INTRAVENOUS | Status: DC

## 2018-02-01 MED ORDER — PROPOFOL 10 MG/ML IV BOLUS
INTRAVENOUS | Status: DC | PRN
  Administered 2018-02-01: 14 mg via INTRAVENOUS
  Administered 2018-02-01: 80 mg via INTRAVENOUS

## 2018-02-01 MED ORDER — PROPOFOL 500 MG/50ML IV EMUL
INTRAVENOUS | Status: DC | PRN
  Administered 2018-02-01: 130 ug/kg/min via INTRAVENOUS

## 2018-02-01 MED ORDER — SODIUM CHLORIDE 0.9 % IV SOLN
INTRAVENOUS | Status: DC
  Administered 2018-02-01: 14:00:00 via INTRAVENOUS

## 2018-02-01 MED ORDER — LIDOCAINE HCL (PF) 2 % IJ SOLN
INTRAMUSCULAR | Status: AC
  Filled 2018-02-01: qty 10

## 2018-02-01 MED ORDER — PROPOFOL 500 MG/50ML IV EMUL
INTRAVENOUS | Status: AC
  Filled 2018-02-01: qty 50

## 2018-02-01 MED ORDER — LIDOCAINE HCL (CARDIAC) PF 100 MG/5ML IV SOSY
PREFILLED_SYRINGE | INTRAVENOUS | Status: DC | PRN
  Administered 2018-02-01: 50 mg via INTRAVENOUS

## 2018-02-01 NOTE — Transfer of Care (Signed)
Immediate Anesthesia Transfer of Care Note  Patient: Mandy Gilbert  Procedure(s) Performed: COLONOSCOPY WITH PROPOFOL (N/A )  Patient Location: PACU and Endoscopy Unit  Anesthesia Type:General  Level of Consciousness: drowsy  Airway & Oxygen Therapy: Patient Spontanous Breathing and Patient connected to nasal cannula oxygen  Post-op Assessment: Report given to RN and Post -op Vital signs reviewed and stable  Post vital signs: Reviewed and stable  Last Vitals:  Vitals Value Taken Time  BP 107/53 02/01/2018  3:03 PM  Temp    Pulse 75 02/01/2018  3:04 PM  Resp 12 02/01/2018  3:04 PM  SpO2 98 % 02/01/2018  3:04 PM  Vitals shown include unvalidated device data.  Last Pain:  Vitals:   02/01/18 1324  TempSrc: Tympanic  PainSc: 0-No pain         Complications: No apparent anesthesia complications

## 2018-02-01 NOTE — Anesthesia Postprocedure Evaluation (Signed)
Anesthesia Post Note  Patient: Mandy Gilbert  Procedure(s) Performed: COLONOSCOPY WITH PROPOFOL (N/A )  Patient location during evaluation: Endoscopy Anesthesia Type: General Level of consciousness: awake and alert Pain management: pain level controlled Vital Signs Assessment: post-procedure vital signs reviewed and stable Respiratory status: spontaneous breathing and respiratory function stable Cardiovascular status: stable Anesthetic complications: no     Last Vitals:  Vitals:   02/01/18 1324 02/01/18 1504  BP: 127/77 (!) 107/53  Pulse: 83 75  Resp: 16 16  Temp: (!) 36.4 C (!) 36.2 C  SpO2: 100% 98%    Last Pain:  Vitals:   02/01/18 1504  TempSrc: Tympanic  PainSc: 0-No pain                 KEPHART,WILLIAM K

## 2018-02-01 NOTE — H&P (Signed)
Primary Care Physician:  Kirk Ruths, MD Primary Gastroenterologist:  Dr. Vira Agar  Pre-Procedure History & Physical: HPI:  Mandy Gilbert is a 77 y.o. female is here for an colonoscopy.  For colon cancer screening.   Past Medical History:  Diagnosis Date  . Cancer (HCC)    Skin cancer legs, basal cell.  . Constipation   . Coronary artery disease   . Edema    ANKLE OCCAS  . Hypertension   . Myocardial infarction [facility])    1/17    Past Surgical History:  Procedure Laterality Date  . BACK SURGERY    . CARDIAC CATHETERIZATION N/A 06/11/2015   Procedure: Left Heart Cath and Coronary Angiography;  Surgeon: Charolette Forward, MD;  Location: [place] CV LAB;  Service: Cardiovascular;  Laterality: N/A;  . CARDIAC CATHETERIZATION N/A 06/11/2015   Procedure: Coronary Stent Intervention;  Surgeon: Charolette Forward, MD;  Location: [place] CV LAB;  Service: Cardiovascular;  Laterality: N/A;  Distal RCA- 3.5x33 Xience  . CATARACT EXTRACTION W/PHACO Left 12/07/2016   Procedure: CATARACT EXTRACTION PHACO AND INTRAOCULAR LENS PLACEMENT (IOC);  Surgeon: Leandrew Koyanagi, MD;  Location: [facility] ORS;  Service: Ophthalmology;  Laterality: Left;  [place] 00:44.3 AP% 16.1 CDE 7.11 Fluid Pack lot # [id number]  . CATARACT EXTRACTION W/PHACO Right 01/11/2017   Procedure: CATARACT EXTRACTION PHACO AND INTRAOCULAR LENS PLACEMENT (IOC);  Surgeon: Leandrew Koyanagi, MD;  Location: [facility] ORS;  Service: Ophthalmology;  Laterality: Right;  Lot # [id number] H [place]: 00:46.6 AP%: 18.4 CDE: 8.56  . COLONOSCOPY    . CORONARY ANGIOPLASTY     STENT 1/17  . LUMBAR LAMINECTOMY/DECOMPRESSION MICRODISCECTOMY Right 03/07/2013   Procedure: LUMBAR LAMINECTOMY/DECOMPRESSION MICRODISCECTOMY LUMBAR FOUR-FIVE;  Surgeon: Charlie Pitter, MD;  Location: [place] NEURO ORS;  Service: Neurosurgery;  Laterality: Right;    Prior to Admission medications   Medication Sig Start Date End Date Taking? Authorizing Provider  Psyllium (METAMUCIL PO)  Take 3 capsules by mouth every morning.   Yes [provider]  aspirin EC 81 MG EC tablet Take 1 tablet (81 mg total) by mouth daily. 06/13/15   Charolette Forward, MD  atorvastatin (LIPITOR) 80 MG tablet Take 1 tablet (80 mg total) by mouth daily at 6 PM. Patient taking differently: Take 40 mg by mouth daily at 6 PM. 40 MG DAILY 06/13/15   Charolette Forward, MD  Cholecalciferol (VITAMIN D PO) Take 1,000 Units by mouth every morning.     [provider]  clopidogrel (PLAVIX) 75 MG tablet Take 75 mg by mouth daily.    [provider]  DILT-XR 240 MG 24 hr capsule  04/09/15   [provider]  indapamide (LOZOL) 1.25 MG tablet  04/14/15   [provider]  loratadine (CLARITIN) 10 MG tablet Take by mouth.    [provider]  losartan-hydrochlorothiazide Konrad Penta) 100-12.5 MG tablet Take by mouth. 03/03/16 03/03/17  [provider]  metoprolol succinate (TOPROL-XL) 100 MG 24 hr tablet Take by mouth. 08/25/15 01/04/17  [provider]  nitroGLYCERIN (NITROSTAT) 0.4 MG SL tablet Place 1 tablet (0.4 mg total) under the tongue every 5 (five) minutes x 3 doses as needed for chest pain. Patient not taking: Reported on 02/01/2018 06/13/15   Charolette Forward, MD  propranolol (INDERAL) 20 MG tablet  04/26/15   [provider]    Allergies as of 11/12/2017 - Review Complete 01/11/2017  Allergen Reaction Noted  . Adhesive [tape] Itching and Other (See Comments) 03/06/2013  . Shrimp [shellfish allergy] Nausea  And Vomiting 03/05/2013  . Sulfa antibiotics Rash 03/05/2013    Family History  Problem Relation Age of Onset  . Breast cancer Mother 90  . Breast cancer Maternal Aunt     Social History   Socioeconomic History  . Marital status: Married    Spouse name: Not on file  . Number of children: Not on file  . Years of education: Not on file  . Highest education level: Not on file  Occupational History  . Not on file  Social Needs  .  Financial resource strain: Not on file  . Food insecurity:    Worry: Not on file    Inability: Not on file  . Transportation needs:    Medical: Not on file    Non-medical: Not on file  Tobacco Use  . Smoking status: Never Smoker  . Smokeless tobacco: Never Used  Substance and Sexual Activity  . Alcohol use: No  . Drug use: No  . Sexual activity: Not on file  Lifestyle  . Physical activity:    Days per week: Not on file    Minutes per session: Not on file  . Stress: Not on file  Relationships  . Social connections:    Talks on phone: Not on file    Gets together: Not on file    Attends religious service: Not on file    Active member of club or organization: Not on file    Attends meetings of clubs or organizations: Not on file    Relationship status: Not on file  . Intimate partner violence:    Fear of current or ex partner: Not on file    Emotionally abused: Not on file    Physically abused: Not on file    Forced sexual activity: Not on file  Other Topics Concern  . Not on file  Social History Narrative  . Not on file    Review of Systems: See HPI, otherwise negative ROS  Physical Exam: BP 127/77   Pulse 83   Temp (!) 97.5 F (36.4 C) (Tympanic)   Resp 16   Ht 5\' 6"  (1.676 m)   Wt 56.2 kg   SpO2 100%   BMI 20.01 kg/m  General:   Alert,  pleasant and cooperative in NAD Head:  Normocephalic and atraumatic. Neck:  Supple; no masses or thyromegaly. Lungs:  Clear throughout to auscultation.    Heart:  Regular rate and rhythm. Abdomen:  Soft, nontender and nondistended. Normal bowel sounds, without guarding, and without rebound.   Neurologic:  Alert and  oriented x4;  grossly normal neurologically.  Impression/Plan: Mandy Gilbert is here for an colonoscopy to be performed for screening of colon cancer.  Risks, benefits, limitations, and alternatives regarding  colonoscopy have been reviewed with the patient.  Questions have been answered.  All parties  agreeable.   Gaylyn Cheers, MD  02/01/2018, 2:39 PM

## 2018-02-01 NOTE — Anesthesia Preprocedure Evaluation (Signed)
Anesthesia Evaluation  Patient identified by MRN, date of birth, ID band Patient awake    Reviewed: Allergy & Precautions, NPO status , Patient's Chart, lab work & pertinent test results, reviewed documented beta blocker date and time   Airway Mallampati: II  TM Distance: >3 FB     Dental  (+) Chipped   Pulmonary           Cardiovascular hypertension, Pt. on medications and Pt. on home beta blockers + CAD, + Past MI, + Cardiac Stents and + Peripheral Vascular Disease       Neuro/Psych  Headaches,    GI/Hepatic   Endo/Other    Renal/GU      Musculoskeletal  (+) Arthritis ,   Abdominal   Peds  Hematology   Anesthesia Other Findings   Reproductive/Obstetrics                             Anesthesia Physical Anesthesia Plan  ASA: III  Anesthesia Plan: General   Post-op Pain Management:    Induction: Intravenous  PONV Risk Score and Plan:   Airway Management Planned:   Additional Equipment:   Intra-op Plan:   Post-operative Plan:   Informed Consent: I have reviewed the patients History and Physical, chart, labs and discussed the procedure including the risks, benefits and alternatives for the proposed anesthesia with the patient or authorized representative who has indicated his/her understanding and acceptance.     Plan Discussed with: CRNA  Anesthesia Plan Comments:         Anesthesia Quick Evaluation

## 2018-02-01 NOTE — Anesthesia Post-op Follow-up Note (Signed)
Anesthesia QCDR form completed.        

## 2018-02-01 NOTE — Op Note (Addendum)
[facility] Gastroenterology Patient Name: Mandy Gilbert Procedure Date: 02/01/2018 2:29 PM MRN: [id number] Account #: [id number] Date of Birth: [birth year] Admit Type: Outpatient Age: 77 Room: [facility] ENDO ROOM 3 Gender: Female Note Status: Finalized Procedure:            Colonoscopy Indications:          Screening for colorectal malignant neoplasm Providers:            Manya Silvas, MD Referring MD:         Ocie Cornfield. Ouida Sills MD, MD (Referring MD) Medicines:            Propofol per Anesthesia Complications:        No immediate complications. Procedure:            Pre-Anesthesia Assessment:                       - After reviewing the risks and benefits, the patient                        was deemed in satisfactory condition to undergo the                        procedure.                       After obtaining informed consent, the colonoscope was                        passed under direct vision. Throughout the procedure,                        the patient's blood pressure, pulse, and oxygen                        saturations were monitored continuously. The                        Colonoscope was introduced through the anus and                        advanced to the the cecum, identified by appendiceal                        orifice and ileocecal valve. The colonoscopy was                        performed without difficulty. The patient tolerated the                        procedure well. The quality of the bowel preparation                        was good. Findings:      A diminutive polyp was found in the descending colon. The polyp was       sessile. The polyp was removed with a jumbo cold forceps. Resection and       retrieval were complete.      Internal hemorrhoids were found during endoscopy. The hemorrhoids were       small and Grade I (internal hemorrhoids that do not prolapse).      The  exam was otherwise without abnormality. Impression:            - One diminutive polyp in the descending colon, removed                        with a jumbo cold forceps. Resected and retrieved.                       - Internal hemorrhoids.                       - The examination was otherwise normal. Recommendation:       - Await pathology results. Manya Silvas, MD 02/01/2018 3:02:09 PM This report has been signed electronically. Number of Addenda: 0 Note Initiated On: 02/01/2018 2:29 PM Scope Withdrawal Time: 0 hours 8 minutes 46 seconds  Total Procedure Duration: 0 hours 14 minutes 44 seconds       [facility]

## 2018-02-05 ENCOUNTER — Encounter: Payer: Self-pay | Admitting: Unknown Physician Specialty

## 2018-02-06 LAB — SURGICAL PATHOLOGY

## 2018-11-18 ENCOUNTER — Other Ambulatory Visit: Payer: Self-pay | Admitting: Internal Medicine

## 2018-11-18 DIAGNOSIS — Z1231 Encounter for screening mammogram for malignant neoplasm of breast: Secondary | ICD-10-CM

## 2018-12-26 ENCOUNTER — Ambulatory Visit
Admission: RE | Admit: 2018-12-26 | Discharge: 2018-12-26 | Disposition: A | Discharge status: Home / Self Care | Payer: Medicare Other | Source: Ambulatory Visit | Attending: Internal Medicine | Admitting: Internal Medicine

## 2018-12-26 DIAGNOSIS — Z1231 Encounter for screening mammogram for malignant neoplasm of breast: Secondary | ICD-10-CM | POA: Diagnosis not present

## 2019-12-04 ENCOUNTER — Other Ambulatory Visit: Payer: Self-pay | Admitting: Internal Medicine

## 2019-12-04 DIAGNOSIS — Z1231 Encounter for screening mammogram for malignant neoplasm of breast: Secondary | ICD-10-CM

## 2020-05-13 ENCOUNTER — Other Ambulatory Visit
Admission: RE | Admit: 2020-05-13 | Discharge: 2020-05-13 | Disposition: A | Discharge status: Home / Self Care | Payer: Medicare Other | Source: Ambulatory Visit | Attending: Pulmonary Disease | Admitting: Pulmonary Disease

## 2020-05-13 DIAGNOSIS — J432 Centrilobular emphysema: Secondary | ICD-10-CM | POA: Diagnosis present

## 2020-05-13 DIAGNOSIS — R5383 Other fatigue: Secondary | ICD-10-CM | POA: Diagnosis present

## 2020-05-13 DIAGNOSIS — R5381 Other malaise: Secondary | ICD-10-CM | POA: Insufficient documentation

## 2020-05-13 LAB — FIBRIN DERIVATIVES D-DIMER (ARMC ONLY): Fibrin derivatives D-dimer (ARMC): 556.72 ng/mL (FEU) — ABNORMAL HIGH (ref 0.00–499.00)

## 2020-06-10 ENCOUNTER — Other Ambulatory Visit: Payer: Self-pay | Admitting: Pulmonary Disease

## 2020-06-10 DIAGNOSIS — C801 Malignant (primary) neoplasm, unspecified: Secondary | ICD-10-CM

## 2020-06-16 ENCOUNTER — Other Ambulatory Visit: Payer: Self-pay

## 2020-06-16 ENCOUNTER — Encounter
Admission: RE | Admit: 2020-06-16 | Discharge: 2020-06-16 | Disposition: A | Discharge status: Home / Self Care | Payer: Medicare Other | Source: Ambulatory Visit | Attending: Pulmonary Disease | Admitting: Pulmonary Disease

## 2020-06-16 DIAGNOSIS — R918 Other nonspecific abnormal finding of lung field: Secondary | ICD-10-CM | POA: Diagnosis not present

## 2020-06-16 DIAGNOSIS — C801 Malignant (primary) neoplasm, unspecified: Secondary | ICD-10-CM | POA: Insufficient documentation

## 2020-06-16 LAB — GLUCOSE, CAPILLARY: Glucose-Capillary: 72 mg/dL (ref 70–99)

## 2020-06-16 MED ORDER — FLUDEOXYGLUCOSE F - 18 (FDG) INJECTION
6.4000 | Freq: Once | INTRAVENOUS | Status: AC | PRN
  Administered 2020-06-16: 6.68 via INTRAVENOUS

## 2021-06-30 ENCOUNTER — Other Ambulatory Visit: Payer: Self-pay | Admitting: Pulmonary Disease

## 2021-06-30 DIAGNOSIS — R590 Localized enlarged lymph nodes: Secondary | ICD-10-CM

## 2021-06-30 DIAGNOSIS — J432 Centrilobular emphysema: Secondary | ICD-10-CM

## 2021-07-07 ENCOUNTER — Ambulatory Visit: Payer: Medicare Other

## 2021-07-15 ENCOUNTER — Ambulatory Visit: Payer: Medicare Other

## 2021-07-27 ENCOUNTER — Ambulatory Visit: Admission: RE | Admit: 2021-07-27 | Payer: Medicare Other | Source: Ambulatory Visit

## 2022-02-05 IMAGING — CT NM PET TUM IMG INITIAL (PI) SKULL BASE T - THIGH
1 of 9 series · 1 of 25 positions shown · non-contrast
Comparison: None

CLINICAL DATA: Subsequent treatment strategy for skin malignancy.

EXAM:
NUCLEAR MEDICINE PET SKULL BASE TO THIGH
TECHNIQUE: 6.7 mCi F-18 FDG was injected intravenously. Full-ring PET imaging
was performed from the skull base to thigh after the radiotracer. CT
data was obtained and used for attenuation correction and anatomic
localization.
Fasting blood glucose: 72 mg/dl

[Series 4: ct wb 5.0 b30f · axial · 5.0mm · 0.98mm/px · 1 of 290 slices shown]
[im 290/290  brain]
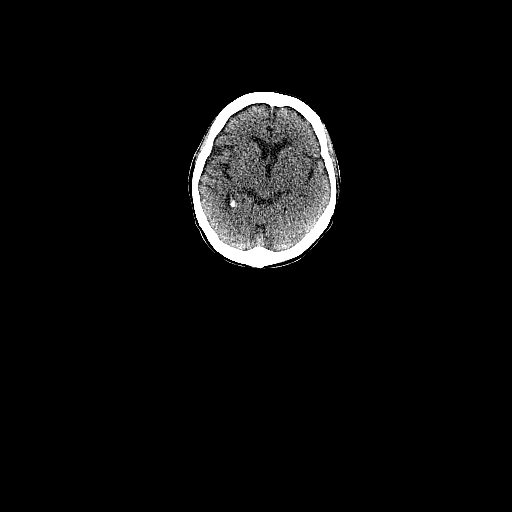

[1 of 25 positions shown; findings below may reference images not displayed]

FINDINGS: Mediastinal blood pool activity: SUV max

Liver activity: SUV max NA

NECK: No hypermetabolic lymph nodes in the neck.

Incidental CT findings: none

CHEST: No pulmonary nodules. No hypermetabolic lesion within the
lung. Band of linear scarring at the RIGHT lung apex with no
significant metabolic activity.

Two hypermetabolic mediastinal lymph nodes. AP window node measuring
5 mm with SUV max equal 4.2. Prevascular node difficult define on CT
adjacent to the proximal LEFT pulmonary artery with SUV max equal
4.9. Similar focus of metabolic activity on the opposite side of the
same vessel SUV max 4.7 (image 206)

Incidental CT findings: none

ABDOMEN/PELVIS: No abnormal hypermetabolic activity within the
liver, pancreas, adrenal glands, or spleen. No hypermetabolic lymph
nodes in the abdomen or pelvis.

Incidental CT findings: none

SKELETON: No evidence skeletal metastasis. Cutaneous hypermetabolic
lesion identified.

Incidental CT findings: none
IMPRESSION: 1. No cutaneous lesions suggest to skin carcinoma. No clear evidence
of metastatic disease.
2. Three small hypermetabolic foci in the LEFT hilum/ mediastinum.
Favor reactive adenopathy. No clear CT lesion identified. Pulmonary
nodules. Consider follow-up CT scan with contrast in 3 months to
re-evaluate.

## 2022-03-02 ENCOUNTER — Other Ambulatory Visit: Payer: Self-pay | Admitting: Unknown Physician Specialty

## 2022-03-02 DIAGNOSIS — R42 Dizziness and giddiness: Secondary | ICD-10-CM

## 2022-03-21 ENCOUNTER — Ambulatory Visit
Admission: RE | Admit: 2022-03-21 | Discharge: 2022-03-21 | Disposition: A | Discharge status: Home / Self Care | Payer: Medicare Other | Source: Ambulatory Visit | Attending: Unknown Physician Specialty | Admitting: Unknown Physician Specialty

## 2022-03-21 DIAGNOSIS — R42 Dizziness and giddiness: Secondary | ICD-10-CM

## 2022-03-21 MED ORDER — GADOBENATE DIMEGLUMINE 529 MG/ML IV SOLN
10.0000 mL | Freq: Once | INTRAVENOUS | Status: AC | PRN
  Administered 2022-03-21: 10 mL via INTRAVENOUS

## 2022-07-03 ENCOUNTER — Emergency Department: Payer: Medicare Other

## 2022-07-03 ENCOUNTER — Other Ambulatory Visit
Admission: RE | Admit: 2022-07-03 | Discharge: 2022-07-03 | Disposition: A | Discharge status: Home / Self Care | Payer: Medicare Other | Source: Ambulatory Visit | Attending: Internal Medicine | Admitting: Internal Medicine

## 2022-07-03 ENCOUNTER — Observation Stay
Admission: EM | Admit: 2022-07-03 | Discharge: 2022-07-04 | Disposition: A | Discharge status: Home / Self Care | Payer: Medicare Other | Attending: Internal Medicine | Admitting: Internal Medicine

## 2022-07-03 DIAGNOSIS — E876 Hypokalemia: Secondary | ICD-10-CM | POA: Insufficient documentation

## 2022-07-03 DIAGNOSIS — Z7982 Long term (current) use of aspirin: Secondary | ICD-10-CM | POA: Insufficient documentation

## 2022-07-03 DIAGNOSIS — Z1152 Encounter for screening for COVID-19: Secondary | ICD-10-CM | POA: Diagnosis not present

## 2022-07-03 DIAGNOSIS — I10 Essential (primary) hypertension: Secondary | ICD-10-CM | POA: Diagnosis present

## 2022-07-03 DIAGNOSIS — D696 Thrombocytopenia, unspecified: Secondary | ICD-10-CM | POA: Diagnosis not present

## 2022-07-03 DIAGNOSIS — I739 Peripheral vascular disease, unspecified: Secondary | ICD-10-CM | POA: Diagnosis not present

## 2022-07-03 DIAGNOSIS — R000 Tachycardia, unspecified: Secondary | ICD-10-CM | POA: Insufficient documentation

## 2022-07-03 DIAGNOSIS — N189 Chronic kidney disease, unspecified: Secondary | ICD-10-CM | POA: Diagnosis not present

## 2022-07-03 DIAGNOSIS — I779 Disorder of arteries and arterioles, unspecified: Secondary | ICD-10-CM | POA: Diagnosis present

## 2022-07-03 DIAGNOSIS — I4892 Unspecified atrial flutter: Principal | ICD-10-CM | POA: Diagnosis present

## 2022-07-03 DIAGNOSIS — Z85828 Personal history of other malignant neoplasm of skin: Secondary | ICD-10-CM | POA: Insufficient documentation

## 2022-07-03 DIAGNOSIS — R0602 Shortness of breath: Secondary | ICD-10-CM | POA: Diagnosis present

## 2022-07-03 DIAGNOSIS — I160 Hypertensive urgency: Secondary | ICD-10-CM | POA: Diagnosis not present

## 2022-07-03 DIAGNOSIS — Z7902 Long term (current) use of antithrombotics/antiplatelets: Secondary | ICD-10-CM | POA: Insufficient documentation

## 2022-07-03 DIAGNOSIS — I129 Hypertensive chronic kidney disease with stage 1 through stage 4 chronic kidney disease, or unspecified chronic kidney disease: Secondary | ICD-10-CM | POA: Insufficient documentation

## 2022-07-03 DIAGNOSIS — I2510 Atherosclerotic heart disease of native coronary artery without angina pectoris: Secondary | ICD-10-CM | POA: Diagnosis present

## 2022-07-03 DIAGNOSIS — Z79899 Other long term (current) drug therapy: Secondary | ICD-10-CM | POA: Insufficient documentation

## 2022-07-03 DIAGNOSIS — E785 Hyperlipidemia, unspecified: Secondary | ICD-10-CM | POA: Diagnosis present

## 2022-07-03 LAB — TROPONIN I (HIGH SENSITIVITY)
Troponin I (High Sensitivity): 21 ng/L — ABNORMAL HIGH (ref <18)
Troponin I (High Sensitivity): 16 ng/L (ref <18)
Troponin I (High Sensitivity): 16 ng/L (ref <18)

## 2022-07-03 LAB — BASIC METABOLIC PANEL
Anion gap: 11 (ref 5–15)
BUN: 26 mg/dL — ABNORMAL HIGH (ref 8–23)
CO2: 29 mmol/L (ref 22–32)
Calcium: 10.8 mg/dL — ABNORMAL HIGH (ref 8.9–10.3)
Chloride: 99 mmol/L (ref 98–111)
Creatinine, Ser: 0.96 mg/dL (ref 0.44–1.00)
GFR, Estimated: 59 mL/min — ABNORMAL LOW (ref >60)
Glucose, Bld: 96 mg/dL (ref 70–99)
Potassium: 3.3 mmol/L — ABNORMAL LOW (ref 3.5–5.1)
Sodium: 139 mmol/L (ref 135–145)

## 2022-07-03 LAB — CBC
HCT: 42.2 % (ref 36.0–46.0)
Hemoglobin: 13.7 g/dL (ref 12.0–15.0)
MCH: 30.8 pg (ref 26.0–34.0)
MCHC: 32.5 g/dL (ref 30.0–36.0)
MCV: 94.8 fL (ref 80.0–100.0)
Platelets: 153 10*3/uL (ref 150–400)
RBC: 4.45 MIL/uL (ref 3.87–5.11)
RDW: 13.6 % (ref 11.5–15.5)
WBC: 6.6 10*3/uL (ref 4.0–10.5)
nRBC: 0 % (ref 0.0–0.2)

## 2022-07-03 LAB — RESP PANEL BY RT-PCR (RSV, FLU A&B, COVID)  RVPGX2
Influenza A by PCR: NEGATIVE
Influenza B by PCR: NEGATIVE
Resp Syncytial Virus by PCR: NEGATIVE
SARS Coronavirus 2 by RT PCR: NEGATIVE

## 2022-07-03 LAB — TSH: TSH: 1.231 u[IU]/mL (ref 0.350–4.500)

## 2022-07-03 LAB — D-DIMER, QUANTITATIVE: D-Dimer, Quant: 0.32 ug/mL-FEU (ref 0.00–0.50)

## 2022-07-03 LAB — BRAIN NATRIURETIC PEPTIDE: B Natriuretic Peptide: 148.4 pg/mL — ABNORMAL HIGH (ref 0.0–100.0)

## 2022-07-03 LAB — PROTIME-INR
INR: 1 (ref 0.8–1.2)
Prothrombin Time: 13.3 seconds (ref 11.4–15.2)

## 2022-07-03 MED ORDER — DRONABINOL 2.5 MG PO CAPS: 2.5000 mg | ORAL_CAPSULE | Freq: Two times a day (BID) | ORAL | Status: DC

## 2022-07-03 MED ORDER — DILTIAZEM HCL-DEXTROSE 125-5 MG/125ML-% IV SOLN (PREMIX): 5.0000 mg/h | INTRAVENOUS | Status: DC

## 2022-07-03 MED ORDER — ASPIRIN 81 MG PO TBEC
81.0000 mg | DELAYED_RELEASE_TABLET | Freq: Every day | ORAL | Status: DC
  Administered 2022-07-04: 81 mg via ORAL
  Filled 2022-07-03: qty 1

## 2022-07-03 MED ORDER — ONDANSETRON HCL 4 MG/2ML IJ SOLN: 4.0000 mg | Freq: Four times a day (QID) | INTRAMUSCULAR | Status: DC | PRN

## 2022-07-03 MED ORDER — METOPROLOL SUCCINATE ER 50 MG PO TB24
50.0000 mg | ORAL_TABLET | Freq: Every day | ORAL | Status: DC
  Administered 2022-07-04: 50 mg via ORAL
  Filled 2022-07-03: qty 1

## 2022-07-03 MED ORDER — ATORVASTATIN CALCIUM 20 MG PO TABS
40.0000 mg | ORAL_TABLET | Freq: Every day | ORAL | Status: DC
  Administered 2022-07-04: 40 mg via ORAL
  Filled 2022-07-03: qty 2

## 2022-07-03 MED ORDER — MIRTAZAPINE 15 MG PO TABS
7.5000 mg | ORAL_TABLET | Freq: Every day | ORAL | Status: DC
  Administered 2022-07-04: 7.5 mg via ORAL
  Filled 2022-07-03: qty 1

## 2022-07-03 MED ORDER — CYCLOSPORINE 0.05 % OP EMUL: 1.0000 [drp] | Freq: Two times a day (BID) | OPHTHALMIC | Status: DC

## 2022-07-03 MED ORDER — MAGNESIUM HYDROXIDE 400 MG/5ML PO SUSP: 30.0000 mL | Freq: Every day | ORAL | Status: DC | PRN

## 2022-07-03 MED ORDER — TRAZODONE HCL 50 MG PO TABS
25.0000 mg | ORAL_TABLET | Freq: Every evening | ORAL | Status: DC | PRN
  Administered 2022-07-04: 25 mg via ORAL
  Filled 2022-07-03: qty 1

## 2022-07-03 MED ORDER — LOSARTAN POTASSIUM-HCTZ 100-12.5 MG PO TABS: 1.0000 | ORAL_TABLET | Freq: Every day | ORAL | Status: DC

## 2022-07-03 MED ORDER — ONDANSETRON HCL 4 MG PO TABS: 4.0000 mg | ORAL_TABLET | Freq: Four times a day (QID) | ORAL | Status: DC | PRN

## 2022-07-03 MED ORDER — APIXABAN 5 MG PO TABS
5.0000 mg | ORAL_TABLET | Freq: Two times a day (BID) | ORAL | Status: DC
  Administered 2022-07-04 (×2): 5 mg via ORAL
  Filled 2022-07-03 (×2): qty 1

## 2022-07-03 MED ORDER — ACETAMINOPHEN 650 MG RE SUPP: 650.0000 mg | Freq: Four times a day (QID) | RECTAL | Status: DC | PRN

## 2022-07-03 MED ORDER — HYDROCHLOROTHIAZIDE 12.5 MG PO TABS
12.5000 mg | ORAL_TABLET | Freq: Every day | ORAL | Status: DC
  Administered 2022-07-04: 12.5 mg via ORAL
  Filled 2022-07-03: qty 1

## 2022-07-03 MED ORDER — DILTIAZEM HCL-DEXTROSE 125-5 MG/125ML-% IV SOLN (PREMIX)
5.0000 mg/h | INTRAVENOUS | Status: DC
  Administered 2022-07-03: 5 mg/h via INTRAVENOUS
  Filled 2022-07-03: qty 125

## 2022-07-03 MED ORDER — SENNA 8.6 MG PO TABS: 1.0000 | ORAL_TABLET | Freq: Every day | ORAL | Status: DC | PRN

## 2022-07-03 MED ORDER — DILTIAZEM HCL 25 MG/5ML IV SOLN
5.0000 mg | Freq: Once | INTRAVENOUS | Status: AC
  Administered 2022-07-03: 5 mg via INTRAVENOUS
  Filled 2022-07-03: qty 5

## 2022-07-03 MED ORDER — DILTIAZEM LOAD VIA INFUSION
11.9000 mg | Freq: Once | INTRAVENOUS | Status: DC
  Filled 2022-07-03: qty 12

## 2022-07-03 MED ORDER — CLOBETASOL PROPIONATE 0.05 % EX CREA: 1.0000 | TOPICAL_CREAM | Freq: Two times a day (BID) | CUTANEOUS | Status: DC

## 2022-07-03 MED ORDER — VITAMIN D 25 MCG (1000 UNIT) PO TABS
1000.0000 [IU] | ORAL_TABLET | Freq: Every morning | ORAL | Status: DC
  Administered 2022-07-04: 1000 [IU] via ORAL
  Filled 2022-07-03: qty 1

## 2022-07-03 MED ORDER — LOSARTAN POTASSIUM 50 MG PO TABS
100.0000 mg | ORAL_TABLET | Freq: Every day | ORAL | Status: DC
  Administered 2022-07-04 (×2): 100 mg via ORAL
  Filled 2022-07-03 (×2): qty 2

## 2022-07-03 MED ORDER — NITROGLYCERIN 0.4 MG SL SUBL: 0.4000 mg | SUBLINGUAL_TABLET | SUBLINGUAL | Status: DC | PRN

## 2022-07-03 MED ORDER — IOHEXOL 300 MG/ML  SOLN
75.0000 mL | Freq: Once | INTRAMUSCULAR | Status: AC | PRN
  Administered 2022-07-03: 75 mL via INTRAVENOUS

## 2022-07-03 MED ORDER — SODIUM CHLORIDE 0.9 % IV SOLN: INTRAVENOUS | Status: DC

## 2022-07-03 MED ORDER — ACETAMINOPHEN 325 MG PO TABS: 650.0000 mg | ORAL_TABLET | Freq: Four times a day (QID) | ORAL | Status: DC | PRN

## 2022-07-03 NOTE — ED Triage Notes (Signed)
Pt sts that she was at her PCP office today and they advised pt to come and be seen as she has a elevated tropin. Pt sts that she went to the dr due to her not feeling well and having dizziness.

## 2022-07-03 NOTE — H&P (Signed)
[place]   PATIENT NAME: Mandy Gilbert    MR#:  [id number]  DATE OF BIRTH:  February 14, 1941  DATE OF ADMISSION:  07/03/2022  PRIMARY CARE PHYSICIAN: Kirk Ruths, MD   Patient is coming from: Home  REQUESTING/REFERRING PHYSICIAN: Ane Payment, MD  CHIEF COMPLAINT:   Chief Complaint  Patient presents with   Dizziness    HISTORY OF PRESENT ILLNESS:  Mandy Gilbert is a 82 y.o. Caucasian female with medical history significant for coronary artery disease and hypertension, who presented to the emergency room with acute onset of exertional dyspnea and fatigue as well as palpitations that started earlier today.  She admits to chest heaviness without nausea or vomiting or diaphoresis.  No cough or wheezing.  No bleeding diathesis.  No dysuria, oliguria, urinary frequency or urgency or flank pain.  No headache or dizziness or blurred vision.  No paresthesias or focal muscle weakness.  The patient was seen at [facility] clinic earlier today and had a troponin I that was 21 and given associated exertional dyspnea she was sent to the ER.  ED Course: When she came to the ER, BP was 177/116 with a heart rate of 107 earlier today and later 141.  BP has been as high as 194/118.  Labs revealed hypokalemia of 3.3 and a BUN of 26 with otherwise unremarkable BMP.  BNP was 148.4 and high sensitive troponin I was 21 earlier than 16 twice here.  CBC was within normal.  Influenza, RSV and COVID-19 PCR came back negative. EKG as reviewed by me : EKG showed atrial flutter with 221 AV block with a rate of 140, right axis deviation, bilateral atrial enlargement and nonspecific T wave abnormalities. Imaging: Two-view chest x-ray showed no acute cardiopulmonary disease.  The patient was given IV Cardizem bolus followed by drip.  She will be admitted to a progressive unit bed for further evaluation and management. PAST MEDICAL HISTORY:   Past Medical History:  Diagnosis Date   Cancer ([place])     Skin cancer legs, basal cell.   Constipation    Coronary artery disease    Edema    ANKLE OCCAS   Hypertension    Myocardial infarction [facility]    1/17    PAST SURGICAL HISTORY:   Past Surgical History:  Procedure Laterality Date   BACK SURGERY     CARDIAC CATHETERIZATION N/A 06/11/2015   Procedure: Left Heart Cath and Coronary Angiography;  Surgeon: Charolette Forward, MD;  Location: [place] CV LAB;  Service: Cardiovascular;  Laterality: N/A;   CARDIAC CATHETERIZATION N/A 06/11/2015   Procedure: Coronary Stent Intervention;  Surgeon: Charolette Forward, MD;  Location: [place] [facility];  Service: Cardiovascular;  Laterality: N/A;  Distal RCA- 3.5x33 Xience   CATARACT EXTRACTION W/PHACO Left 12/07/2016   Procedure: CATARACT EXTRACTION PHACO AND INTRAOCULAR LENS PLACEMENT (IOC);  Surgeon: Leandrew Koyanagi, MD;  Location: [facility] ORS;  Service: Ophthalmology;  Laterality: Left;  [place] 00:44.3 AP% 16.1 CDE 7.11 Fluid Pack lot # [id number]   CATARACT EXTRACTION W/PHACO Right 01/11/2017   Procedure: CATARACT EXTRACTION PHACO AND INTRAOCULAR LENS PLACEMENT (IOC);  Surgeon: Leandrew Koyanagi, MD;  Location: [facility] ORS;  Service: Ophthalmology;  Laterality: Right;  Lot # [id number] H [place]: 00:46.6 AP%: 18.4 CDE: 8.56   COLONOSCOPY     COLONOSCOPY WITH PROPOFOL N/A 02/01/2018   Procedure: COLONOSCOPY WITH PROPOFOL;  Surgeon: Manya Silvas, MD;  Location: [facility] ENDOSCOPY;  Service: Endoscopy;  Laterality: N/A;   CORONARY ANGIOPLASTY  STENT 1/17   LUMBAR LAMINECTOMY/DECOMPRESSION MICRODISCECTOMY Right 03/07/2013   Procedure: LUMBAR LAMINECTOMY/DECOMPRESSION MICRODISCECTOMY LUMBAR FOUR-FIVE;  Surgeon: Charlie Pitter, MD;  Location: [place] [facility]UR[facility] ORS;  Service: Neurosurgery;  Laterality: Right;    SOCIAL HISTORY:   Social History   Tobacco Use   Smoking status: Never   Smokeless tobacco: Never  Substance Use Topics   Alcohol use: No    FAMILY HISTORY:   Family History  Problem Relation Age of Onset    Breast cancer Mother 5   Breast cancer Maternal Aunt     DRUG ALLERGIES:   Allergies  Allergen Reactions   Adhesive [Tape] Itching and Other (See Comments)    Turns red.  Can use paper tape   Shrimp [Shellfish Allergy] Nausea And Vomiting   Sulfa Antibiotics Rash    REVIEW OF SYSTEMS:   ROS As per history of present illness. All pertinent systems were reviewed above. Constitutional, HEENT, cardiovascular, respiratory, GI, GU, musculoskeletal, neuro, psychiatric, endocrine, integumentary and hematologic systems were reviewed and are otherwise negative/unremarkable except for positive findings mentioned above in the HPI.   MEDICATIONS AT HOME:   Prior to Admission medications   Medication Sig Start Date End Date Taking? Authorizing Provider  aspirin EC 81 MG EC tablet Take 1 tablet (81 mg total) by mouth daily. 06/13/15   Charolette Forward, MD  atorvastatin (LIPITOR) 80 MG tablet Take 1 tablet (80 mg total) by mouth daily at 6 PM. Patient taking differently: Take 40 mg by mouth daily at 6 PM. 40 MG DAILY 06/13/15   Charolette Forward, MD  Cholecalciferol (VITAMIN D PO) Take 1,000 Units by mouth every morning.     [provider]  clopidogrel (PLAVIX) 75 MG tablet Take 75 mg by mouth daily.    [provider]  DILT-XR 240 MG 24 hr capsule  04/09/15   [provider]  indapamide (LOZOL) 1.25 MG tablet  04/14/15   [provider]  loratadine (CLARITIN) 10 MG tablet Take by mouth.    [provider]  losartan-hydrochlorothiazide Konrad Penta) 100-12.5 MG tablet Take by mouth. 03/03/16 03/03/17  [provider]  metoprolol succinate (TOPROL-XL) 100 MG 24 hr tablet Take by mouth. 08/25/15 01/04/17  [provider]  nitroGLYCERIN (NITROSTAT) 0.4 MG SL tablet Place 1 tablet (0.4 mg total) under the tongue every 5 (five) minutes x 3 doses as needed for chest pain. Patient not taking: Reported on 02/01/2018 06/13/15   Charolette Forward, MD  propranolol  (INDERAL) 20 MG tablet  04/26/15   [provider]  Psyllium (METAMUCIL PO) Take 3 capsules by mouth every morning.    [provider]      VITAL SIGNS:  Blood pressure (!) 178/124, pulse (!) 140, temperature 98.1 F (36.7 C), temperature source Oral, resp. rate 20, weight 47.6 kg, SpO2 100 %.  PHYSICAL EXAMINATION:  Physical Exam  GENERAL:  82 y.o.-year-old Caucasian female patient lying in the bed with no acute distress.  EYES: Pupils equal, round, reactive to light and accommodation. No scleral icterus. Extraocular muscles intact.  HEENT: Head atraumatic, normocephalic. Oropharynx and nasopharynx clear.  NECK:  Supple, no jugular venous distention. No thyroid enlargement, no tenderness.  LUNGS: Normal breath sounds bilaterally, no wheezing, rales,rhonchi or crepitation. No use of accessory muscles of respiration.  CARDIOVASCULAR: Regular rate tachycardic rhythm, S1, S2 normal. No murmurs, rubs, or gallops.  ABDOMEN: Soft, nondistended, nontender. Bowel sounds present. No organomegaly or mass.  EXTREMITIES: No pedal edema, cyanosis, or clubbing.  NEUROLOGIC:  Cranial nerves II through XII are intact. Muscle strength 5/5 in all extremities. Sensation intact. Gait not checked.  PSYCHIATRIC: The patient is alert and oriented x 3.  Normal affect and good eye contact. SKIN: No obvious rash, lesion, or ulcer.   LABORATORY PANEL:   CBC Recent Labs  Lab 07/03/22 1847  WBC 6.6  HGB 13.7  HCT 42.2  PLT 153   ------------------------------------------------------------------------------------------------------------------  Chemistries  Recent Labs  Lab 07/03/22 1847  NA 139  K 3.3*  CL 99  CO2 29  GLUCOSE 96  BUN 26*  CREATININE 0.96  CALCIUM 10.8*   ------------------------------------------------------------------------------------------------------------------  Cardiac Enzymes No results for input(s): "TROPONINI" in the last 168  hours. ------------------------------------------------------------------------------------------------------------------  RADIOLOGY:  CT Chest W Contrast  Result Date: 07/03/2022 CLINICAL DATA:  Dyspnea. EXAM: CT CHEST WITH CONTRAST TECHNIQUE: Multidetector CT imaging of the chest was performed during intravenous contrast administration. RADIATION DOSE REDUCTION: This exam was performed according to the departmental dose-optimization program which includes automated exposure control, adjustment of the mA and/or kV according to patient size and/or use of iterative reconstruction technique. CONTRAST:  90m OMNIPAQUE IOHEXOL 300 MG/ML  SOLN COMPARISON:  PET-CT 06/16/2020. FINDINGS: Cardiovascular: No significant vascular findings. Normal heart size. No pericardial effusion. There are atherosclerotic calcifications of the aorta. Mediastinum/Nodes: No enlarged mediastinal, hilar, or axillary lymph nodes. Thyroid gland, trachea, and esophagus demonstrate no significant findings. Lungs/Pleura: There is pleural-parenchymal scarring in the lung apices. The lungs are otherwise clear. There is no pleural effusion or pneumothorax. Upper Abdomen: Left renal cysts are present measuring up to 2.6 cm gallstones are likely present. Musculoskeletal: No chest wall abnormality. No acute or significant osseous findings. IMPRESSION: 1. No acute cardiopulmonary process. 2. Cholelithiasis. 3. Left Bosniak I benign renal cyst measuring 2.6 cm. No follow-up imaging is recommended. JACR 2018 Feb; 264-273, Management of the Incidental Renal Mass on CT, [employer] 2021; 814-848, Bosniak Classification of Cystic Renal Masses, Version 2019. Aortic Atherosclerosis (ICD10-I70.0). Electronically Signed   By: ARonney AstersM.D.   On: 07/03/2022 21:22   DG Chest 2 View  Result Date: 07/03/2022 CLINICAL DATA:  Chest pain EXAM: CHEST - 2 VIEW COMPARISON:  03/06/2013 FINDINGS: The heart size and mediastinal contours are within normal  limits. Hyperinflated lungs. Both lungs are clear. The visualized skeletal structures are unremarkable. IMPRESSION: No active cardiopulmonary disease. Electronically Signed   By: AMerilyn BabaM.D.   On: 07/03/2022 19:37      IMPRESSION AND PLAN:  Assessment and Plan: * Atrial flutter with rapid ventricular response ([place] - The patient will be admitted to a progressive unit bed. - We will continue her on IV Cardizem drip..Marland Kitchen- Her CHA2DS2-VASc score is 5. - She will be started on p.o. Eliquis. - 2D echo will be obtained. - We will check her TSH. - Cardiology consult will be obtained. - [facility] - She has a scheduled appointment with Dr. GRockey Situon Tuesday. - I [facility]who [facility]  Hypertensive urgency - We will continue her antihypertensives. - She will be on IV Cardizem drip  Dyslipidemia - We will continue statin therapy.  Coronary artery disease - We will continue aspirin, beta-blocker therapy, statin therapy and as needed sublingual nitroglycerin.   DVT prophylaxis: Eliquis. Advanced Care Planning:  Code Status: The patient is DNR.  She accepts intubation and cardioversion.  This was discussed with her and her husband. Family Communication:  The plan of care was discussed in details with the  patient (and family). I answered all questions. The patient agreed to proceed with the above mentioned plan. Further management will depend upon hospital course. Disposition Plan: Back to previous home environment Consults called: Cardiology.   All the records are reviewed and case discussed with ED provider.  Status is: Inpatient   At the time of the admission, it appears that the appropriate admission status for this patient is inpatient.  This is judged to be reasonable and necessary in order to provide the required intensity of service to ensure the patient's safety given the presenting symptoms, physical exam findings and initial  radiographic and laboratory data in the context of comorbid conditions.  The patient requires inpatient status due to high intensity of service, high risk of further deterioration and high frequency of surveillance required.  I certify that at the time of admission, it is my clinical judgment that the patient will require inpatient hospital care extending more than 2 midnights.                            Dispo: The patient is from: Home              Anticipated d/c is to: Home              Patient currently is not medically stable to d/c.              Difficult to place patient: No  Christel Mormon M.D on 07/03/2022 at 10:54 PM  [facility]   From 7 PM-7 AM, contact night-coverage [email or web address]  CC: Primary care physician; Kirk Ruths, MD

## 2022-07-03 NOTE — Assessment & Plan Note (Addendum)
-  The patient will be admitted to a progressive unit bed. - We will continue her on IV Cardizem drip.Marland Kitchen - Her CHA2DS2-VASc score is 5. - She will be started on p.o. Eliquis. - 2D echo will be obtained. - We will check her TSH. - Cardiology consult will be obtained. - The patient requested [facility]. - She has a scheduled appointment with Dr. Rockey Situ on Tuesday. - I notified Dr. Oval Linsey who is on-call for [facility].

## 2022-07-03 NOTE — Assessment & Plan Note (Signed)
-  We will continue aspirin, beta-blocker therapy, statin therapy and as needed sublingual nitroglycerin.

## 2022-07-03 NOTE — Assessment & Plan Note (Signed)
-  We will continue her antihypertensives. - She will be on IV Cardizem drip

## 2022-07-03 NOTE — Assessment & Plan Note (Signed)
-  We will continue statin therapy. 

## 2022-07-03 NOTE — ED Provider Notes (Addendum)
[facility] Provider Note    Event Date/Time   First MD Initiated Contact with Patient 07/03/22 1943     (approximate)   History   Dizziness   HPI  Mandy Gilbert is a 82 y.o. female with a history of CAD, hypertension CKD, hyperlipidemia, osteoporosis, and osteoarthritis, who presents with shortness of breath, persistent course for the last several months, gradually worsening, and not acutely worsened today.  The patient also has some intermittent chest discomfort which is also exertional.  She denies any orthopnea or paroxysmal nocturnal dyspnea.  She states that she has been sleeping well.  She denies any significant leg swelling.  She has no cough or fever.  The patient was seen in primary care today and sent to the ED for an abnormal lab test.  I reviewed the past medical records.  The patient was seen by Dr. Ouida Sills from internal medicine today for increased chest pain and shortness of breath on exertion.  Troponin was minimally elevated at 21 today and the patient was advised to come to the ED.  She has no other recent ED visits or admissions.   Physical Exam   Triage Vital Signs: ED Triage Vitals  Enc Vitals Group     BP 07/03/22 1838 (!) 177/116     Pulse Rate 07/03/22 1838 (!) 107     Resp 07/03/22 1838 18     Temp 07/03/22 1838 98.1 F (36.7 C)     Temp Source 07/03/22 1838 Oral     SpO2 07/03/22 1838 98 %     Weight 07/03/22 1839 105 lb (47.6 kg)     Height --      Head Circumference --      Peak Flow --      Pain Score 07/03/22 1839 0     Pain Loc --      Pain Edu? --      Excl. in [place]? --     Most recent vital signs: Vitals:   07/03/22 2230 07/03/22 2304  BP: (!) 178/124   Pulse: (!) 140   Resp: 20   Temp:  98.2 F (36.8 C)  SpO2: 100%      General: Awake, no distress.  CV:  Good peripheral perfusion.  Normal heart sounds. Resp:  Normal effort.  Lungs CTAB. Abd:  No distention.  Other:  No peripheral edema.   ED  Results / Procedures / Treatments   Labs (all labs ordered are listed, but only abnormal results are displayed) Labs Reviewed  BASIC METABOLIC PANEL - Abnormal; Notable for the following components:      Result Value   Potassium 3.3 (*)    BUN 26 (*)    Calcium 10.8 (*)    GFR, Estimated 59 (*)    All other components within normal limits  BRAIN NATRIURETIC PEPTIDE - Abnormal; Notable for the following components:   B Natriuretic Peptide 148.4 (*)    All other components within normal limits  RESP PANEL BY RT-PCR (RSV, FLU A&B, COVID)  RVPGX2  CBC  PROTIME-INR  BASIC METABOLIC PANEL  CBC  TSH  TROPONIN I (HIGH SENSITIVITY)  TROPONIN I (HIGH SENSITIVITY)     EKG  ED ECG REPORT I, Arta Silence, the attending physician, personally viewed and interpreted this ECG.  Date: 07/03/2022 EKG Time: 1847 Rate: 111 Rhythm: Sinus tachycardia QRS Axis: Right axis Intervals: normal ST/T Wave abnormalities: Nonspecific T wave abnormalities Narrative Interpretation: no evidence of acute ischemia  ED ECG REPORT I, Arta Silence, the attending physician, personally viewed and interpreted this ECG.  Date: 07/03/2022 EKG Time: 2211 Rate: 140 Rhythm: Atrial flutter with 2-1 AV block QRS Axis: normal Intervals: Nonspecific IVCD ST/T Wave abnormalities: Nonspecific T wave abnormalities Narrative Interpretation: Atrial flutter with no evidence of acute ischemia   RADIOLOGY  Chest x-ray: I independently viewed and interpreted the images; there is no focal consolidation or edema  PROCEDURES:  Critical Care performed: No  Procedures   MEDICATIONS ORDERED IN ED: Medications  diltiazem (CARDIZEM) 1 mg/mL load via infusion 11.9 mg (has no administration in time range)    And  diltiazem (CARDIZEM) 125 mg in dextrose 5% 125 mL (1 mg/mL) infusion (5 mg/hr Intravenous New Bag/Given 07/03/22 2243)  atorvastatin (LIPITOR) tablet 40 mg (has no administration in time range)   metoprolol succinate (TOPROL-XL) 24 hr tablet 50 mg (has no administration in time range)  nitroGLYCERIN (NITROSTAT) SL tablet 0.4 mg (has no administration in time range)  aspirin EC tablet 81 mg (has no administration in time range)  mirtazapine (REMERON) tablet 7.5 mg (has no administration in time range)  dronabinol (MARINOL) capsule 2.5 mg (has no administration in time range)  senna (SENOKOT) tablet 8.6 mg (has no administration in time range)  cholecalciferol (VITAMIN D3) 25 MCG (1000 UNIT) tablet 1,000 Units (has no administration in time range)  clobetasol cream (TEMOVATE) 5.42 % 1 Application (has no administration in time range)  cycloSPORINE (RESTASIS) 0.05 % ophthalmic emulsion 1 drop (has no administration in time range)  0.9 %  sodium chloride infusion (has no administration in time range)  acetaminophen (TYLENOL) tablet 650 mg (has no administration in time range)    Or  acetaminophen (TYLENOL) suppository 650 mg (has no administration in time range)  traZODone (DESYREL) tablet 25 mg (has no administration in time range)  magnesium hydroxide (MILK OF MAGNESIA) suspension 30 mL (has no administration in time range)  ondansetron (ZOFRAN) tablet 4 mg (has no administration in time range)    Or  ondansetron (ZOFRAN) injection 4 mg (has no administration in time range)  apixaban (ELIQUIS) tablet 5 mg (has no administration in time range)  losartan (COZAAR) tablet 100 mg (has no administration in time range)  hydrochlorothiazide (HYDRODIURIL) tablet 12.5 mg (has no administration in time range)  iohexol (OMNIPAQUE) 300 MG/ML solution 75 mL (75 mLs Intravenous Contrast Given 07/03/22 2105)  diltiazem (CARDIZEM) injection 5 mg (5 mg Intravenous Given 07/03/22 2215)     IMPRESSION / MDM / ASSESSMENT AND PLAN / ED COURSE  I reviewed the triage vital signs and the nursing notes.  82 year old female with PMH as noted above presents with subacute to chronic exertional shortness of  breath and chest discomfort.  The patient was sent to the ED today because of a borderline elevated troponin from an outpatient visit, but otherwise has no acute symptoms.  Physical exam is unremarkable.  Differential diagnosis includes, but is not limited to, chronic lung diseases including fibrosis, sarcoidosis, new onset CHF, COPD, less likely infectious cause given the duration of the symptoms.  Chest x-ray does not show any acute findings.  I also do not suspect PE given the duration of the symptoms and the lack of signs or symptoms of DVT or any specific VTE risk factors.  We will obtain lab workup including cardiac enzymes, BNP to rule out acute CHF, respiratory panel, and reassess.  We will also obtain chest CT to rule out other acute pulmonary etiologies.  Patient's  presentation is most consistent with acute presentation with potential threat to life or bodily function.  The patient is on the cardiac monitor to evaluate for evidence of arrhythmia and/or significant heart rate changes.  ----------------------------------------- 10:20 PM on 07/03/2022 -----------------------------------------  The patient's workup is reassuring.  Chest x-ray and CT chest do not show acute findings.  BNP is minimally elevated.  Respiratory panel is negative.  However, on reassessment after getting up to go to the bathroom, the patient was tachycardic to the 130s.  This was sustained despite the patient resting in the stretcher for 10 to 15 minutes.  The patient feels short of with this and states that this is similar to the symptoms that she has been having.  EKG shows atrial flutter.  I think that this may be the etiology of her ongoing symptoms.  She will need admission for further workup and treatment.  I ordered a dose of IV Cardizem.  I consulted Dr. Sidney Ace from the hospitalist service; based our discussion he agrees to admit the patient.   FINAL CLINICAL IMPRESSION(S) / ED DIAGNOSES   Final diagnoses:   Exertional shortness of breath  Atrial flutter, unspecified type ([place])     Rx / DC Orders   ED Discharge Orders     None        Note:  This document was prepared using Dragon voice recognition software and may include unintentional dictation errors.    Arta Silence, MD 07/03/22 Way[place] Cera    Arta Silence, MD 07/03/22 [phone]

## 2022-07-04 ENCOUNTER — Inpatient Hospital Stay (HOSPITAL_BASED_OUTPATIENT_CLINIC_OR_DEPARTMENT_OTHER)
Admit: 2022-07-04 | Discharge: 2022-07-04 | Disposition: A | Discharge status: Home / Self Care | Payer: Medicare Other | Attending: Family Medicine | Admitting: Family Medicine

## 2022-07-04 ENCOUNTER — Other Ambulatory Visit: Payer: Self-pay

## 2022-07-04 DIAGNOSIS — I10 Essential (primary) hypertension: Secondary | ICD-10-CM

## 2022-07-04 DIAGNOSIS — I2510 Atherosclerotic heart disease of native coronary artery without angina pectoris: Secondary | ICD-10-CM | POA: Diagnosis not present

## 2022-07-04 DIAGNOSIS — I4892 Unspecified atrial flutter: Secondary | ICD-10-CM | POA: Diagnosis not present

## 2022-07-04 DIAGNOSIS — I480 Paroxysmal atrial fibrillation: Secondary | ICD-10-CM | POA: Diagnosis not present

## 2022-07-04 DIAGNOSIS — E876 Hypokalemia: Secondary | ICD-10-CM | POA: Diagnosis not present

## 2022-07-04 DIAGNOSIS — D696 Thrombocytopenia, unspecified: Secondary | ICD-10-CM | POA: Insufficient documentation

## 2022-07-04 LAB — ECHOCARDIOGRAM COMPLETE
AR max vel: 1.55 cm2
AV Area VTI: 1.73 cm2
AV Area mean vel: 1.56 cm2
AV Mean grad: 5 mmHg
AV Peak grad: 9.9 mmHg
Ao pk vel: 1.57 m/s
Area-P 1/2: 5.02 cm2
P 1/2 time: 452 msec
S' Lateral: 2.1 cm
Single Plane A4C EF: 68.4 %
Weight: 1680 oz

## 2022-07-04 LAB — CBC
HCT: 38.5 % (ref 36.0–46.0)
Hemoglobin: 12.7 g/dL (ref 12.0–15.0)
MCH: 30.9 pg (ref 26.0–34.0)
MCHC: 33 g/dL (ref 30.0–36.0)
MCV: 93.7 fL (ref 80.0–100.0)
Platelets: 140 10*3/uL — ABNORMAL LOW (ref 150–400)
RBC: 4.11 MIL/uL (ref 3.87–5.11)
RDW: 13.6 % (ref 11.5–15.5)
WBC: 9.1 10*3/uL (ref 4.0–10.5)
nRBC: 0 % (ref 0.0–0.2)

## 2022-07-04 LAB — BASIC METABOLIC PANEL
Anion gap: 11 (ref 5–15)
BUN: 22 mg/dL (ref 8–23)
CO2: 28 mmol/L (ref 22–32)
Calcium: 10 mg/dL (ref 8.9–10.3)
Chloride: 98 mmol/L (ref 98–111)
Creatinine, Ser: 0.97 mg/dL (ref 0.44–1.00)
GFR, Estimated: 58 mL/min — ABNORMAL LOW (ref >60)
Glucose, Bld: 117 mg/dL — ABNORMAL HIGH (ref 70–99)
Potassium: 3.1 mmol/L — ABNORMAL LOW (ref 3.5–5.1)
Sodium: 137 mmol/L (ref 135–145)

## 2022-07-04 LAB — MAGNESIUM: Magnesium: 1.9 mg/dL (ref 1.7–2.4)

## 2022-07-04 MED ORDER — APIXABAN 2.5 MG PO TABS: 2.5000 mg | ORAL_TABLET | Freq: Two times a day (BID) | ORAL | 0 refills | Status: DC

## 2022-07-04 MED ORDER — DILTIAZEM HCL ER COATED BEADS 120 MG PO CP24: 120.0000 mg | ORAL_CAPSULE | Freq: Every day | ORAL | 0 refills | Status: DC

## 2022-07-04 MED ORDER — POTASSIUM CHLORIDE CRYS ER 10 MEQ PO TBCR: 10.0000 meq | EXTENDED_RELEASE_TABLET | Freq: Two times a day (BID) | ORAL | 0 refills | Status: DC

## 2022-07-04 MED ORDER — ATORVASTATIN CALCIUM 80 MG PO TABS: 80.0000 mg | ORAL_TABLET | Freq: Every day | ORAL | Status: DC

## 2022-07-04 MED ORDER — APIXABAN 2.5 MG PO TABS: 2.5000 mg | ORAL_TABLET | Freq: Two times a day (BID) | ORAL | Status: DC

## 2022-07-04 MED ORDER — POTASSIUM CHLORIDE CRYS ER 20 MEQ PO TBCR
40.0000 meq | EXTENDED_RELEASE_TABLET | ORAL | Status: AC
  Administered 2022-07-04 (×2): 40 meq via ORAL
  Filled 2022-07-04 (×2): qty 2

## 2022-07-04 MED ORDER — DILTIAZEM HCL 60 MG PO TABS
30.0000 mg | ORAL_TABLET | Freq: Four times a day (QID) | ORAL | Status: DC
  Administered 2022-07-04: 30 mg via ORAL
  Filled 2022-07-04: qty 1

## 2022-07-04 NOTE — Care Management CC44 (Signed)
Condition Code 44 Documentation Completed  Patient Details  Name: Mandy Gilbert MRN: [id number] Date of Birth: 09-12-1940   Condition Code 44 given:  Yes Patient signature on Condition Code 44 notice:  Yes Documentation of 2 MD's agreement:  Yes Code 44 added to claim:  Yes    Shelbie Hutching, RN 07/04/2022, 3:25 PM

## 2022-07-04 NOTE — Hospital Course (Signed)
Mandy Gilbert is a 82 y.o. Caucasian female with medical history significant for coronary artery disease and hypertension, who presented to the emergency room with acute onset of exertional dyspnea and fatigue as well as palpitations.  EKG and telemetry showed atrial flutter with heart rate of 140.  Patient was given IV diltiazem, has converted to sinus rhythm.

## 2022-07-04 NOTE — Consult Note (Signed)
Cardiology Consultation:   Patient ID: Mandy Gilbert; [id number]; February 10, 1941   Admit date: 07/03/2022 Date of Consult: 07/04/2022  Primary Care Provider: Kirk Ruths, MD Primary Cardiologist: New - consult by End (appt with [facility] 2/6) Primary Electrophysiologist:  None   Patient Profile:   Mandy Gilbert is a 82 y.o. female with a hx of CAD status post PCI to the mid to distal RCA in 2017, emphysema followed by pulmonology, carotid artery disease with further details unclear, HTN, and HLD who is being seen today for the evaluation of new onset A-fib/flutter with RVR and elevated troponin at the request of Dr. Sidney Ace.  History of Present Illness:   Mandy Gilbert was previously followed by [facility] cardiology.  Most recent ischemic evaluation via nuclear stress test in 2021 showed no evidence of infarction or ischemia with preserved LV systolic function.  Echo from 02/2020 showed an EF greater than 55%, normal wall motion, mild LVH, trivial mitral regurgitation and mild tricuspid regurgitation.  She was last evaluated by normal cardiology in 03/2022 noting dizziness felt to be multifactorial (further details unclear) with recommendation for observation.   She is in the process of changing from [facility] cardiology to [facility] cardiology and has an appointment with Dr. Rockey Situ on 07/11/2022.   She was evaluated by her PCP on 07/03/2022 for worsening chest pain, dyspnea, tachypalpitations, and fatigue.  EKG unavailable for review.  Vitals documented a heart rate of 106 bpm.  Outpatient troponin was obtained and found to be elevated at 21 recommendation to proceed to the ED.  Initial BP 177/116.  Initial and peak high-sensitivity troponin 21 with a delta troponin of 16 x 2.  BNP 148.  D-dimer negative.  CBC unremarkable.  Potassium 3.3, BUN 26, serum creatinine 0.96.  TSH normal.  Chest x-ray without acute cardiopulmonary process.  CT chest without acute cardiopulmonary process with  cholelithiasis and aortic atherosclerosis noted as well as a benign renal cyst measuring 2.6 cm without follow-up imaging recommended.  Initial EKG showed sinus tachycardia with PACs with suspected arm lead reversal with nonspecific ST-T changes. On telemetry she was noted to be in atrial flutter with RVR with variable AV block with subsequent development of Afib with RVR.  In the ED, she was given IV fluids, diltiazem injection 5 mg, KCl, and started on a diltiazem drip along with apixaban.  At the time of cardiology consult, she is noted to be in sinus rhythm with frequent PACs.  She tells me this morning she has had a long history of fatigue that dates back 1-2 years. More recently, over the past several months she has had intermittent palpitations with associated increase in fatigue, dizziness, chest heaviness, and SOB. These episodes are randomly occurring. Her husband reports the patient got lost while driving home from [place] several months ago, and reports these symptoms began after that episode. Around 07/01/2022, she developed an increase in her chest heaviness with dyspnea prompting her to be evaluated by her PCP with referral to the ED as outlined above. Currently, maintaining sinus rhythm with a mildly tachycardic rate with frequent PACs. No current angina.     Past Medical History:  Diagnosis Date   Cancer ([place])    Skin cancer legs, basal cell.   Constipation    Coronary artery disease    Edema    ANKLE OCCAS   Hypertension    Myocardial infarction [facility])    1/17    Past Surgical History:  Procedure Laterality Date  BACK SURGERY     CARDIAC CATHETERIZATION N/A 06/11/2015   Procedure: Left Heart Cath and Coronary Angiography;  Surgeon: Charolette Forward, MD;  Location: [place] CV LAB;  Service: Cardiovascular;  Laterality: N/A;   CARDIAC CATHETERIZATION N/A 06/11/2015   Procedure: Coronary Stent Intervention;  Surgeon: Charolette Forward, MD;  Location: [place] CV LAB;  Service:  Cardiovascular;  Laterality: N/A;  Distal RCA- 3.5x33 Xience   CATARACT EXTRACTION W/PHACO Left 12/07/2016   Procedure: CATARACT EXTRACTION PHACO AND INTRAOCULAR LENS PLACEMENT (IOC);  Surgeon: Leandrew Koyanagi, MD;  Location: [facility] ORS;  Service: Ophthalmology;  Laterality: Left;  [place] 00:44.3 AP% 16.1 CDE 7.11 Fluid Pack lot # [id number]   CATARACT EXTRACTION W/PHACO Right 01/11/2017   Procedure: CATARACT EXTRACTION PHACO AND INTRAOCULAR LENS PLACEMENT (IOC);  Surgeon: Leandrew Koyanagi, MD;  Location: [facility] ORS;  Service: Ophthalmology;  Laterality: Right;  Lot # [id number] H [place]: 00:46.6 AP%: 18.4 CDE: 8.56   COLONOSCOPY     COLONOSCOPY WITH PROPOFOL N/A 02/01/2018   Procedure: COLONOSCOPY WITH PROPOFOL;  Surgeon: Manya Silvas, MD;  Location: [facility] ENDOSCOPY;  Service: Endoscopy;  Laterality: N/A;   CORONARY ANGIOPLASTY     STENT 1/17   LUMBAR LAMINECTOMY/DECOMPRESSION MICRODISCECTOMY Right 03/07/2013   Procedure: LUMBAR LAMINECTOMY/DECOMPRESSION MICRODISCECTOMY LUMBAR FOUR-FIVE;  Surgeon: Charlie Pitter, MD;  Location: [place] NEURO ORS;  Service: Neurosurgery;  Laterality: Right;     Home Meds: Prior to Admission medications   Medication Sig Start Date End Date Taking? Authorizing Provider  aspirin EC 81 MG EC tablet Take 1 tablet (81 mg total) by mouth daily. 06/13/15  Yes Charolette Forward, MD  atorvastatin (LIPITOR) 40 MG tablet Take 40 mg by mouth daily.   Yes [provider]  Cholecalciferol (VITAMIN D PO) Take 1,000 Units by mouth every morning.    Yes [provider]  clobetasol cream (TEMOVATE) 6.72 % Apply 1 Application topically 2 (two) times daily. 04/12/22  Yes [provider]  cycloSPORINE (RESTASIS) 0.05 % ophthalmic emulsion Place 1 drop into both eyes 2 (two) times daily. 08/01/21  Yes [provider]  dronabinol (MARINOL) 2.5 MG capsule Take 2.5 mg by mouth 2 (two) times daily before lunch and supper. 07/03/22 09/01/22 Yes [provider]   losartan-hydrochlorothiazide (HYZAAR) 100-12.5 MG tablet Take 1 tablet by mouth daily. 03/03/16 07/03/22 Yes [provider]  metoprolol succinate (TOPROL-XL) 50 MG 24 hr tablet Take 50 mg by mouth daily.   Yes [provider]  mirtazapine (REMERON) 7.5 MG tablet Take 7.5 mg by mouth at bedtime. 06/15/22  Yes [provider]  acetaminophen (TYLENOL) 500 MG tablet Take 1,000 mg by mouth at bedtime as needed.    [provider]  nitroGLYCERIN (NITROSTAT) 0.4 MG SL tablet Place 1 tablet (0.4 mg total) under the tongue every 5 (five) minutes x 3 doses as needed for chest pain. Patient not taking: Reported on 02/01/2018 06/13/15   Charolette Forward, MD  senna (SENOKOT) 8.6 MG tablet Take 1 tablet by mouth daily as needed.    [provider]    Inpatient Medications: Scheduled Meds:  apixaban  5 mg Oral BID   aspirin EC  81 mg Oral Daily   atorvastatin  40 mg Oral Daily   cholecalciferol  1,000 Units Oral q morning   clobetasol cream  1 Application Topical BID   cycloSPORINE  1 drop Both Eyes BID   diltiazem  11.9 mg Intravenous Once   dronabinol  2.5 mg Oral BID AC  losartan  100 mg Oral Daily   metoprolol succinate  50 mg Oral Daily   mirtazapine  7.5 mg Oral QHS   Continuous Infusions:  diltiazem (CARDIZEM) infusion 5 mg/hr (07/04/22 0735)   PRN Meds: acetaminophen **OR** acetaminophen, magnesium hydroxide, nitroGLYCERIN, ondansetron **OR** ondansetron (ZOFRAN) IV, senna, traZODone  Allergies:   Allergies  Allergen Reactions   Adhesive [Tape] Itching and Other (See Comments)    Turns red.  Can use paper tape   Shrimp [Shellfish Allergy] Nausea And Vomiting   Sulfa Antibiotics Rash    Social History:   Social History   Socioeconomic History   Marital status: Married    Spouse name: Not on file   Number of children: Not on file   Years of education: Not on file   Highest education level: Not on file  Occupational History   Not on file   Tobacco Use   Smoking status: Never   Smokeless tobacco: Never  Vaping Use   Vaping Use: Never used  Substance and Sexual Activity   Alcohol use: No   Drug use: No   Sexual activity: Not on file  Other Topics Concern   Not on file  Social History Narrative   Not on file   Social Determinants of Health   Financial Resource Strain: Not on file  Food Insecurity: Not on file  Transportation Needs: Not on file  Physical Activity: Not on file  Stress: Not on file  Social Connections: Not on file  Intimate Partner Violence: Not on file     Family History:   Family History  Problem Relation Age of Onset   Breast cancer Mother 36   Breast cancer Maternal Aunt     ROS:  Review of Systems  Constitutional:  Positive for malaise/fatigue. Negative for chills, diaphoresis, fever and weight loss.  HENT:  Negative for congestion.   Eyes:  Negative for discharge and redness.  Respiratory:  Positive for shortness of breath. Negative for cough, sputum production and wheezing.   Cardiovascular:  Positive for chest pain and palpitations. Negative for orthopnea, claudication, leg swelling and PND.  Gastrointestinal:  Negative for abdominal pain, blood in stool, heartburn, melena, nausea and vomiting.  Musculoskeletal:  Negative for falls and myalgias.  Skin:  Negative for rash.  Neurological:  Positive for dizziness and weakness. Negative for tingling, tremors, sensory change, speech change, focal weakness and loss of consciousness.  Endo/Heme/Allergies:  Does not bruise/bleed easily.  Psychiatric/Behavioral:  Negative for substance abuse. The patient is not nervous/anxious.   All other systems reviewed and are negative.     Physical Exam/Data:   Vitals:   07/04/22 0700 07/04/22 0733 07/04/22 0900 07/04/22 0925  BP: 119/60  121/60 121/60  Pulse: 85  100 (!) 108  Resp: 18  19   Temp:  98.1 F (36.7 C)    TempSrc:  Oral    SpO2: 100%  100%   Weight:       No intake or output  data in the 24 hours ending 07/04/22 0939 Filed Weights   07/03/22 1839  Weight: 47.6 kg   Body mass index is 16.95 kg/m.   Physical Exam: General: Frail appearing, in no acute distress. Head: Normocephalic, atraumatic, sclera non-icteric, no xanthomas, nares without discharge.  Neck: Negative for carotid bruits. JVD not elevated. Lungs: Clear bilaterally to auscultation without wheezes, rales, or rhonchi. Breathing is unlabored. Heart: Mildly tachycardic with frequent extrasystoles with S1 S2. No murmurs, rubs, or gallops appreciated. Abdomen: Soft, non-tender,  non-distended with normoactive bowel sounds. No hepatomegaly. No rebound/guarding. No obvious abdominal masses. Msk:  Strength and tone appear normal for age. Extremities: No clubbing or cyanosis. No edema. Distal pedal pulses are 2+ and equal bilaterally. Neuro: Alert and oriented X 3. No facial asymmetry. No focal deficit. Moves all extremities spontaneously. Psych:  Responds to questions appropriately with a normal affect.   EKG:  The EKG was personally reviewed and demonstrates: 07/03/2022 -sinus tachycardia, 111 bpm, suspected arm lead reversal, nonspecific ST-T changes.  1/30 (07: 00) NSR, 77 bpm QTc 506, nonspecific ST-T changes.  1/30 (07: 02) NSR, 70 bpm, occasional PACs, nonspecific ST-T changes, improved QTc Telemetry:  Telemetry was personally reviewed and demonstrates: Atrial flutter with RVR with variable AV block transitioning to A-fib with RVR with conversion to sinus rhythm at 4:05 AM with sinus tachycardia and frequent PACs  Weights: Filed Weights   07/03/22 1839  Weight: 47.6 kg    Relevant CV Studies:  Lexiscan MPI 02/19/2020 Jef[facility]): LVEF= 83%   FINDINGS:  Regional wall motion:  reveals normal myocardial thickening and wall  motion.  The overall quality of the study is good.   Artifacts noted: no  Left ventricular cavity: normal.   Perfusion Analysis:  SPECT images demonstrate homogeneous tracer   distribution throughout the myocardium. Defect type : Normal  __________  2D echo 02/05/2020 [facility]): INTERPRETATION  NORMAL LEFT VENTRICULAR SYSTOLIC FUNCTION   WITH MILD LVH  NORMAL RIGHT VENTRICULAR SYSTOLIC FUNCTION  MILD VALVULAR REGURGITATION (See above)  NO VALVULAR STENOSIS  TRIVIAL AR, MR  MILD TR, PR  EF 55%  Closest EF: >55% (Estimated)  Aortic: TRIVIAL AR  Mitral: TRIVIAL MR  Tricuspid: MILD TR   Laboratory Data:  Chemistry Recent Labs  Lab 07/03/22 1847 07/04/22 0520  NA 139 137  K 3.3* 3.1*  CL 99 98  CO2 29 28  GLUCOSE 96 117*  BUN 26* 22  CREATININE 0.96 0.97  CALCIUM 10.8* 10.0  GFRNONAA 59* 58*  ANIONGAP 11 11    No results for input(s): "PROT", "ALBUMIN", "AST", "ALT", "ALKPHOS", "BILITOT" in the last 168 hours. Hematology Recent Labs  Lab 07/03/22 1847 07/04/22 0520  WBC 6.6 9.1  RBC 4.45 4.11  HGB 13.7 12.7  HCT 42.2 38.5  MCV 94.8 93.7  MCH 30.8 30.9  MCHC 32.5 33.0  RDW 13.6 13.6  PLT 153 140*   Cardiac EnzymesNo results for input(s): "TROPONINI" in the last 168 hours. No results for input(s): "TROPIPOC" in the last 168 hours.  BNP Recent Labs  Lab 07/03/22 1847  BNP 148.4*    DDimer  Recent Labs  Lab 07/03/22 1409  DDIMER 0.32    Radiology/Studies:  CT Chest W Contrast  Result Date: 07/03/2022 IMPRESSION: 1. No acute cardiopulmonary process. 2. Cholelithiasis. 3. Left Bosniak I benign renal cyst measuring 2.6 cm. No follow-up imaging is recommended. JACR 2018 Feb; 264-273, Management of the Incidental Renal Mass on CT, [employer] 2021; [phone], Bosniak Classification of Cystic Renal Masses, Version 2019. Aortic Atherosclerosis (ICD10-I70.0). Electronically Signed   By: Ronney Asters M.D.   On: 07/03/2022 21:22   DG Chest 2 View  Result Date: 07/03/2022 IMPRESSION: No active cardiopulmonary disease. Electronically Signed   By: Merilyn Baba M.D.   On: 07/03/2022 19:37    Assessment and Plan:   Newly diagnosed  A-fib/flutter with RVR:  -Currently in sinus rhythm with frequent PACs with rates in the 90s to low 100s bpm -Start short acting diltiazem 30 mg every 6 hours  with recommendation to consolidate to long-acting Cardizem prior to discharge, pending EF -Stop diltiazem drip 60 minutes after initiating short acting oral diltiazem -Continue Toprol-XL for now, if needed for added heart rate control could transition to metoprolol to tartrate -CHA2DS2-VASc at least 5 (HTN, age x 2, vascular disease, sex category)  -Started on apixaban 5 mg twice daily upon admission (patient meets reduced dosing criteria of 2.5 mg twice daily given age greater than 24 and weight less than 60 kg) will change patient to 2.5 mg twice daily of apixaban -Replete potassium and check magnesium as outlined below -TSH normal  CAD involving the native coronary arteries with elevated high-sensitivity troponin and exertional chest pain/dyspnea:  -Currently, without chest heaviness -Elevated high-sensitivity troponin downtrending, not consistent with ACS, possibly in the setting of supply demand ischemia with intermittent episodes of A-fib/flutter with RVR with underlying known CAD -Obtain echo with further recommendations regarding ischemic evaluation pending results -PTA ASA for now, pending workup may look to discontinue aspirin given her advanced age and frail state in an effort to minimize bleeding risk now that she will be on apixaban  HTN:  -Blood pressure well-controlled -If needed for added heart rate control, could hold losartan, otherwise continue metoprolol and diltiazem as outlined above  HLD:  -LDL 82 from 04/2022 with goal being at least less than 70  -Titrate atorvastatin to 80 mg  Hypokalemia: -Received oral repletion in the ED this morning -Recommend maintaining potassium of 4.0 -Check magnesium with recommendation to maintain level of 2.0    For questions or updates, please contact [facility] Please  consult [email or web address] for contact info under Cardiology/STEMI.   Signed, Christell Faith, PA-C [facility] Pager: [phone] 07/04/2022, 9:39 AM

## 2022-07-04 NOTE — Care Management Obs Status (Signed)
[facility] NOTIFICATION   Patient Details  Name: Mandy Gilbert MRN: [id number] Date of Birth: [birth year]   Medicare Observation Status Notification Given:  Yes    Shelbie Hutching, RN 07/04/2022, 3:25 PM

## 2022-07-04 NOTE — Discharge Summary (Signed)
Physician Discharge Summary   Patient: Mandy Gilbert MRN: [id number] DOB: [birth year]  Admit date:     07/03/2022  Discharge date: 07/04/22  Discharge Physician: Sharen Hones   PCP: Kirk Ruths, MD   Recommendations at discharge:   Follow-up with PCP in 1 week. Follow-up with cardiology in 1 month.  Discharge Diagnoses: Principal Problem:   Atrial flutter with rapid ventricular response (HCC) Active Problems:   Carotid artery disease (HCC)   Coronary artery disease   Dyslipidemia   HTN (hypertension), benign   Hypertensive urgency   Hypokalemia   Thrombocytopenia (HCC)  Resolved Problems:   * No resolved hospital problems. *  Hospital Course: Mandy Gilbert is a 82 y.o. Caucasian female with medical history significant for coronary artery disease and hypertension, who presented to the emergency room with acute onset of exertional dyspnea and fatigue as well as palpitations.  EKG and telemetry showed atrial flutter with heart rate of 140.  Patient was given IV diltiazem, has converted to sinus rhythm.    Assessment and Plan: * Atrial flutter with rapid ventricular response (HCC) Patient is started on diltiazem drip, converted to sinus. Patient also started on Eliquis for stroke prevention. Discussed with cardiology, patient will be treated with diltiazem 120 mg daily. Echocardiogram showed ejection fraction 65 to 70% with normal diastolic dysfunction. At this point, patient is medically stable to be discharged. Hypertensive urgency Resume home medicine treatment, added diltiazem.  Hypokalemia Repleted, also prescribed potassium chloride 10 mEq twice a day as patient is also taking hydrochlorothiazide.  Dyslipidemia We will continue statin therapy.  Coronary artery disease Home treatment.        Consultants: Cardiology Procedures performed: None  Disposition: Home Diet recommendation:  Discharge Diet Orders (From admission, onward)     Start      Ordered   07/04/22 0000  Diet - low sodium heart healthy        07/04/22 1513           Cardiac diet DISCHARGE MEDICATION: Allergies as of 07/04/2022       Reactions   Adhesive [tape] Itching, Other (See Comments)   Turns red.  Can use paper tape   Shrimp [shellfish Allergy] Nausea And Vomiting   Sulfa Antibiotics Rash        Medication List     TAKE these medications    acetaminophen 500 MG tablet Commonly known as: TYLENOL Take 1,000 mg by mouth at bedtime as needed.   apixaban 2.5 MG Tabs tablet Commonly known as: ELIQUIS Take 1 tablet (2.5 mg total) by mouth 2 (two) times daily.   aspirin EC 81 MG tablet Take 1 tablet (81 mg total) by mouth daily.   atorvastatin 40 MG tablet Commonly known as: LIPITOR Take 40 mg by mouth daily.   clobetasol cream 0.05 % Commonly known as: TEMOVATE Apply 1 Application topically 2 (two) times daily.   diltiazem 120 MG 24 hr capsule Commonly known as: Cardizem CD Take 1 capsule (120 mg total) by mouth daily.   dronabinol 2.5 MG capsule Commonly known as: MARINOL Take 2.5 mg by mouth 2 (two) times daily before lunch and supper.   losartan-hydrochlorothiazide 100-12.5 MG tablet Commonly known as: HYZAAR Take 1 tablet by mouth daily.   metoprolol succinate 50 MG 24 hr tablet Commonly known as: TOPROL-XL Take 50 mg by mouth daily.   mirtazapine 7.5 MG tablet Commonly known as: REMERON Take 7.5 mg by mouth at bedtime.   nitroGLYCERIN 0.4 MG  SL tablet Commonly known as: NITROSTAT Place 1 tablet (0.4 mg total) under the tongue every 5 (five) minutes x 3 doses as needed for chest pain.   potassium chloride 10 MEQ tablet Commonly known as: KLOR-CON M Take 1 tablet (10 mEq total) by mouth 2 (two) times daily.   Restasis 0.05 % ophthalmic emulsion Generic drug: cycloSPORINE Place 1 drop into both eyes 2 (two) times daily.   senna 8.6 MG tablet Commonly known as: SENOKOT Take 1 tablet by mouth daily as needed.    VITAMIN D PO Take 1,000 Units by mouth every morning.        Follow-up Information     Kirk Ruths, MD Follow up in 1 week(s).   Specialty: Internal Medicine Contact information: [address] [place] [address] [phone]         Minna Merritts, MD Follow up in 1 month(s).   Specialty: Cardiology Contact information: [place] [address] [phone]                Discharge Exam: Danley Danker Weights   07/03/22 1839  Weight: 47.6 kg   General exam: Appears calm and comfortable  Respiratory system: Clear to auscultation. Respiratory effort normal. Cardiovascular system: S1 & S2 heard, RRR. No JVD, murmurs, rubs, gallops or clicks. No pedal edema. Gastrointestinal system: Abdomen is nondistended, soft and nontender. No organomegaly or masses felt. Normal bowel sounds heard. Central nervous system: Alert and oriented. No focal neurological deficits. Extremities: Symmetric 5 x 5 power. Skin: No rashes, lesions or ulcers Psychiatry: Judgement and insight appear normal. Mood & affect appropriate.    Condition at discharge: good  The results of significant diagnostics from this hospitalization (including imaging, microbiology, ancillary and laboratory) are listed below for reference.   Imaging Studies: ECHOCARDIOGRAM COMPLETE  Result Date: 07/04/2022    ECHOCARDIOGRAM REPORT   Patient Name:   Mandy Gilbert Date of Exam: 07/04/2022 Medical Rec #:  [id number]      Height:       66.0 in Accession #:    [id number]     Weight:       105.0 lb Date of Birth:  29-Feb-1941      BSA:          1.521 m Patient Age:    53 years       BP:           121/60 mmHg Patient Gender: F              HR:           81 bpm. Exam Location:  [facility] Procedure: 2D Echo Indications:     atrial flutter  History:         Patient has no prior history of Echocardiogram examinations.                  Acute MI, Arrythmias:Atrial  Flutter; Risk Factors:Hypertension.  Sonographer:     Harvie Junior Referring Phys:  [id number] JAN A MANSY Diagnosing Phys: Nelva Bush MD IMPRESSIONS  1. Left ventricular ejection fraction, by estimation, is 65 to 70%. The left ventricle has normal function. The left ventricle has no regional wall motion abnormalities. Left ventricular diastolic parameters were normal.  2. Right ventricular systolic function is normal. The right ventricular size is normal. There is normal pulmonary artery systolic pressure.  3. The mitral valve is degenerative. Trivial mitral valve regurgitation. No  evidence of mitral stenosis.  4. The aortic valve is tricuspid. There is mild thickening of the aortic valve. Aortic valve regurgitation is mild. Aortic valve sclerosis is present, with no evidence of aortic valve stenosis.  5. The inferior vena cava is normal in size with <50% respiratory variability, suggesting right atrial pressure of 8 mmHg. FINDINGS  Left Ventricle: Left ventricular ejection fraction, by estimation, is 65 to 70%. The left ventricle has normal function. The left ventricle has no regional wall motion abnormalities. The left ventricular internal cavity size was normal in size. There is  no left ventricular hypertrophy. Left ventricular diastolic parameters were normal. Right Ventricle: The right ventricular size is normal. No increase in right ventricular wall thickness. Right ventricular systolic function is normal. There is normal pulmonary artery systolic pressure. The tricuspid regurgitant velocity is 2.35 m/s, and  with an assumed right atrial pressure of 8 mmHg, the estimated right ventricular systolic pressure is 70.9 mmHg. Left Atrium: Left atrial size was normal in size. Right Atrium: Right atrial size was normal in size. Pericardium: There is no evidence of pericardial effusion. Mitral Valve: The mitral valve is degenerative in appearance. There is mild thickening of the mitral valve leaflet(s). There is  mild calcification of the mitral valve leaflet(s). Mild mitral annular calcification. Trivial mitral valve regurgitation. No evidence of mitral valve stenosis. Tricuspid Valve: The tricuspid valve is normal in structure. Tricuspid valve regurgitation is mild. Aortic Valve: The aortic valve is tricuspid. There is mild thickening of the aortic valve. Aortic valve regurgitation is mild. Aortic regurgitation PHT measures 452 msec. Aortic valve sclerosis is present, with no evidence of aortic valve stenosis. Aortic valve mean gradient measures 5.0 mmHg. Aortic valve peak gradient measures 9.9 mmHg. Aortic valve area, by VTI measures 1.73 cm. Pulmonic Valve: The pulmonic valve was not well visualized. Pulmonic valve regurgitation is not visualized. No evidence of pulmonic stenosis. Aorta: The aortic root is normal in size and structure. Pulmonary Artery: The pulmonary artery is not well seen. Venous: The inferior vena cava was not well visualized. The inferior vena cava is normal in size with less than 50% respiratory variability, suggesting right atrial pressure of 8 mmHg. IAS/Shunts: The interatrial septum was not well visualized.  LEFT VENTRICLE PLAX 2D LVIDd:         3.30 cm     Diastology LVIDs:         2.10 cm     LV e' medial:    8.49 cm/s LV PW:         0.80 cm     LV E/e' medial:  13.0 LV IVS:        0.80 cm     LV e' lateral:   9.90 cm/s LVOT diam:     1.80 cm     LV E/e' lateral: 11.1 LV SV:         53 LV SV Index:   35 LVOT Area:     2.54 cm  LV Volumes (MOD) LV vol d, MOD A4C: 68.3 ml LV vol s, MOD A4C: 21.6 ml LV SV MOD A4C:     68.3 ml RIGHT VENTRICLE RV Basal diam:  2.40 cm RV Mid diam:    2.30 cm LEFT ATRIUM             Index        RIGHT ATRIUM          Index LA diam:        2.50 cm  1.64 cm/m   RA Area:     8.02 cm LA Vol (A2C):   46.3 ml 30.45 ml/m  RA Volume:   12.60 ml 8.29 ml/m LA Vol (A4C):   35.1 ml 23.08 ml/m LA Biplane Vol: 44.1 ml 29.00 ml/m  AORTIC VALVE                     PULMONIC  VALVE AV Area (Vmax):    1.55 cm      PV Vmax:       0.87 m/s AV Area (Vmean):   1.56 cm      PV Peak grad:  3.0 mmHg AV Area (VTI):     1.73 cm AV Vmax:           157.00 cm/s AV Vmean:          108.000 cm/s AV VTI:            0.308 m AV Peak Grad:      9.9 mmHg AV Mean Grad:      5.0 mmHg LVOT Vmax:         95.90 cm/s LVOT Vmean:        66.000 cm/s LVOT VTI:          0.209 m LVOT/AV VTI ratio: 0.68 AI PHT:            452 msec  AORTA Ao Root diam: 2.70 cm MITRAL VALVE                TRICUSPID VALVE MV Area (PHT): 5.02 cm     TR Peak grad:   22.1 mmHg MV Decel Time: 151 msec     TR Vmax:        235.00 cm/s MV E velocity: 110.00 cm/s MV A velocity: 69.20 cm/s   SHUNTS MV E/A ratio:  1.59         Systemic VTI:  0.21 m                             Systemic Diam: 1.80 cm Nelva Bush MD Electronically signed by Nelva Bush MD Signature Date/Time: 07/04/2022/1:19:28 PM    Final    CT Chest W Contrast  Result Date: 07/03/2022 CLINICAL DATA:  Dyspnea. EXAM: CT CHEST WITH CONTRAST TECHNIQUE: Multidetector CT imaging of the chest was performed during intravenous contrast administration. RADIATION DOSE REDUCTION: This exam was performed according to the departmental dose-optimization program which includes automated exposure control, adjustment of the mA and/or kV according to patient size and/or use of iterative reconstruction technique. CONTRAST:  86m OMNIPAQUE IOHEXOL 300 MG/ML  SOLN COMPARISON:  PET-CT 06/16/2020. FINDINGS: Cardiovascular: No significant vascular findings. Normal heart size. No pericardial effusion. There are atherosclerotic calcifications of the aorta. Mediastinum/Nodes: No enlarged mediastinal, hilar, or axillary lymph nodes. Thyroid gland, trachea, and esophagus demonstrate no significant findings. Lungs/Pleura: There is pleural-parenchymal scarring in the lung apices. The lungs are otherwise clear. There is no pleural effusion or pneumothorax. Upper Abdomen: Left renal cysts are present  measuring up to 2.6 cm gallstones are likely present. Musculoskeletal: No chest wall abnormality. No acute or significant osseous findings. IMPRESSION: 1. No acute cardiopulmonary process. 2. Cholelithiasis. 3. Left Bosniak I benign renal cyst measuring 2.6 cm. No follow-up imaging is recommended. JACR 2018 Feb; 264-273, Management of the Incidental Renal Mass on CT, [employer] 2021; 814-848, Bosniak Classification of Cystic Renal Masses, Version 2019. Aortic Atherosclerosis (ICD10-I70.0). Electronically Signed  By: Ronney Asters M.D.   On: 07/03/2022 21:22   DG Chest 2 View  Result Date: 07/03/2022 CLINICAL DATA:  Chest pain EXAM: CHEST - 2 VIEW COMPARISON:  03/06/2013 FINDINGS: The heart size and mediastinal contours are within normal limits. Hyperinflated lungs. Both lungs are clear. The visualized skeletal structures are unremarkable. IMPRESSION: No active cardiopulmonary disease. Electronically Signed   By: Merilyn Baba M.D.   On: 07/03/2022 19:37    Microbiology: Results for orders placed or performed during the hospital encounter of 07/03/22  Resp panel by RT-PCR (RSV, Flu A&B, Covid) Anterior Nasal Swab     Status: None   Collection Time: 07/03/22  8:05 PM   Specimen: Anterior Nasal Swab  Result Value Ref Range Status   SARS Coronavirus 2 by RT PCR NEGATIVE NEGATIVE Final    Comment: (NOTE) SARS-CoV-2 target nucleic acids are NOT DETECTED.  The SARS-CoV-2 RNA is generally detectable in upper respiratory specimens during the acute phase of infection. The lowest concentration of SARS-CoV-2 viral copies this assay can detect is 138 copies/mL. A negative result does not preclude SARS-Cov-2 infection and should not be used as the sole basis for treatment or other patient management decisions. A negative result may occur with  improper specimen collection/handling, submission of specimen other than nasopharyngeal swab, presence of viral mutation(s) within the areas targeted by this  assay, and inadequate number of viral copies(<138 copies/mL). A negative result must be combined with clinical observations, patient history, and epidemiological information. The expected result is Negative.  Fact Sheet for Patients:  EntrepreneurPulse.com.au  Fact Sheet for Healthcare Providers:  IncredibleEmployment.be  This test is no t yet approved or cleared by the [place] FDA and  has been authorized for detection and/or diagnosis of SARS-CoV-2 by FDA under an Emergency Use Authorization (EUA). This EUA will remain  in effect (meaning this test can be used) for the duration of the COVID-19 declaration under Section 564(b)(1) of the Act, 21 U.S.C.section 360bbb-3(b)(1), unless the authorization is terminated  or revoked sooner.       Influenza A by PCR NEGATIVE NEGATIVE Final   Influenza B by PCR NEGATIVE NEGATIVE Final    Comment: (NOTE) The Xpert Xpress SARS-CoV-2/FLU/RSV plus assay is intended as an aid in the diagnosis of influenza from Nasopharyngeal swab specimens and should not be used as a sole basis for treatment. Nasal washings and aspirates are unacceptable for Xpert Xpress SARS-CoV-2/FLU/RSV testing.  Fact Sheet for Patients: EntrepreneurPulse.com.au  Fact Sheet for Healthcare Providers: IncredibleEmployment.be  This test is not yet approved or cleared by the [place] FDA and has been authorized for detection and/or diagnosis of SARS-CoV-2 by FDA under an Emergency Use Authorization (EUA). This EUA will remain in effect (meaning this test can be used) for the duration of the COVID-19 declaration under Section 564(b)(1) of the Act, 21 U.S.C. section 360bbb-3(b)(1), unless the authorization is terminated or revoked.     Resp Syncytial Virus by PCR NEGATIVE NEGATIVE Final    Comment: (NOTE) Fact Sheet for Patients: EntrepreneurPulse.com.au  Fact Sheet for  Healthcare Providers: IncredibleEmployment.be  This test is not yet approved or cleared by the [place] FDA and has been authorized for detection and/or diagnosis of SARS-CoV-2 by FDA under an Emergency Use Authorization (EUA). This EUA will remain in effect (meaning this test can be used) for the duration of the COVID-19 declaration under Section 564(b)(1) of the Act, 21 U.S.C. section 360bbb-3(b)(1), unless the authorization is terminated or revoked.  Performed at  [address] [place], [place] [address]     Labs: CBC: Recent Labs  Lab 07/03/22 1847 07/04/22 0520  WBC 6.6 9.1  HGB 13.7 12.7  HCT 42.2 38.5  MCV 94.8 93.7  PLT 153 614*   Basic Metabolic Panel: Recent Labs  Lab 07/03/22 1847 07/04/22 0520  NA 139 137  K 3.3* 3.1*  CL 99 98  CO2 29 28  GLUCOSE 96 117*  BUN 26* 22  CREATININE 0.96 0.97  CALCIUM 10.8* 10.0  MG  --  1.9   Liver Function Tests: No results for input(s): "AST", "ALT", "ALKPHOS", "BILITOT", "PROT", "ALBUMIN" in the last 168 hours. CBG: No results for input(s): "GLUCAP" in the last 168 hours.  Discharge time spent: greater than 30 minutes.  Signed: Sharen Hones, MD [facility] 07/04/2022

## 2022-07-04 NOTE — Progress Notes (Signed)
  Echocardiogram 2D Echocardiogram has been performed.  Mandy Gilbert 07/04/2022, 10:31 AM

## 2022-07-05 ENCOUNTER — Telehealth: Payer: Self-pay | Admitting: *Deleted

## 2022-07-05 ENCOUNTER — Ambulatory Visit: Payer: BC Managed Care – PPO | Attending: Physician Assistant

## 2022-07-05 DIAGNOSIS — I4891 Unspecified atrial fibrillation: Secondary | ICD-10-CM

## 2022-07-05 DIAGNOSIS — I4892 Unspecified atrial flutter: Secondary | ICD-10-CM

## 2022-07-05 NOTE — Telephone Encounter (Signed)
-----  Message from Rise Mu, PA-C sent at 07/04/2022  2:15 PM EST ----- Please mail a Zio XT for 14 days. Dx: Afib/flutter. Her OV to establish care with Dr. Rockey Situ (2/6) should be pushed back for 4-6 weeks, still with him to establish care. Thanks.

## 2022-07-05 NOTE — Telephone Encounter (Signed)
Patient came by the office for monitor and appointment. Instructed front office to schedule her appointment for 6 weeks out and that the monitor has been mailed to them with instructions.

## 2022-07-06 NOTE — Telephone Encounter (Signed)
Spoke with patients spouse and reviewed that the monitor will come in the mail with instructions. He was confused thinking that the monitor would be placed at that visit. Advised that they should get the monitor in a few days to wear and the reason for the appointment is to wait for the results of that monitor. He then verbalized understanding and had no further questions or concerns.

## 2022-07-06 NOTE — Telephone Encounter (Signed)
Patient's husband is following up, requesting to speak with RN again. He states he is confused and he does not understand why the patient is being seen on 3/18. He assumes the patient should be seen sooner for assistance with having her monitor placed. RN available for advisement on this matter.

## 2022-07-08 DIAGNOSIS — I4891 Unspecified atrial fibrillation: Secondary | ICD-10-CM | POA: Diagnosis not present

## 2022-07-08 DIAGNOSIS — I4892 Unspecified atrial flutter: Secondary | ICD-10-CM

## 2022-07-11 ENCOUNTER — Ambulatory Visit: Payer: BC Managed Care – PPO | Admitting: Cardiovascular Disease

## 2022-08-03 ENCOUNTER — Other Ambulatory Visit: Payer: Self-pay | Admitting: Cardiovascular Disease

## 2022-08-03 NOTE — Telephone Encounter (Signed)
Please review

## 2022-08-03 NOTE — Telephone Encounter (Addendum)
Prescription refill request for Eliquis received.  Indication: aflutter Last office visit: Pt is scheduled to Dr on 3/18 Scr: 0.97, 07/04/2022 Age: 82 yo  Weight: 47.6 kg   Pt is on the correct dose. Spoke to [employer] D okay to send. Refill sent

## 2022-08-03 NOTE — Telephone Encounter (Signed)
Please assist patient with refill request

## 2022-08-08 ENCOUNTER — Telehealth: Payer: Self-pay | Admitting: *Deleted

## 2022-08-08 NOTE — Telephone Encounter (Signed)
Reviewed results and recommendations with patient and she verbalized understanding with no further questions at this time.

## 2022-08-08 NOTE — Telephone Encounter (Signed)
Left voicemail message to call back to review results.  

## 2022-08-08 NOTE — Telephone Encounter (Signed)
Patient is returning call.  °

## 2022-08-08 NOTE — Telephone Encounter (Signed)
-----   Message from Rise Mu, PA-C sent at 08/06/2022  8:40 AM EST ----- Heart monitor showed a predominant rhythm of sinus with an average rate of 71 bpm (range 42-200 bpm), 48 episodes of SVT with the fasting interval lasting just 5 beats and the longest interval lasting 15.9 seconds, occasional extra beats from the top portion of the heart and rare extra beats from the bottom portion of the heart. Patient triggered events corresponded to normal rhythm, PAC and PVC. Follow up as scheduled this month to reassess symptoms and to determine if further evaluation or medication adjustment is needed.

## 2022-08-19 NOTE — Progress Notes (Unsigned)
Cardiology Office Note  Date:  08/21/2022   ID:  Mandy Gilbert, DOB 14-Oct-1940, MRN [id number]  PCP:  Kirk Ruths, MD   Chief Complaint  Patient presents with   New Patient (Initial Visit)    Ref by Dr. Ouida Sills to establish care for CAD, s/p stent placement 2017-A-fib/A-flutter; former Dr. Ubaldo Glassing patient. Patient c/o shortness of breath and heaviness in chest at times. Medications reviewed by the patient verbally.     HPI:  Mandy Gilbert is an 82 year old woman with history of  CAD status post PCI to the mid and distal RCA in 2017,  COPD,  carotid artery stenosis,  hypertension,   hyperlipidemia,  Paroxysmal atrial flutter/fibrillation in the hospital January 2024 Who presents to establish care in the office for her atrial fib/flutter  Previously followed by [facility] cardiology Admitted to the hospital in January 2024 after symptoms of intermittent dizziness as well as marked fatigue  She saw her PCP for dizziness, fatigue, chest pain. Outpatient troponin was minimally elevated, prompting referral to the ED. She was initially noted to be hypertensive with a blood pressure of 177/116. Initial EKG showed sinus tachycardia and PACs. She subsequently developed atrial flutter on telemetry with conversion to atrial fibrillation. She was continued on her home dose of oral metoprolol and also given IV diltiazem. This was transitioned to oral diltiazem after she spontaneously converted back to sinus rhythm.   Normal echocardiogram Discharged on metoprolol succinate 50 with diltiazem ER 120 daily, Eliquis 2.5 twice daily CHA2DS2-VASc score of at least 5 (reduced dose with age > 80 and wt < 60 kg).   Prior Myoview September 2021, no ischemia  Zio monitor at discharge with no significant A-fib flutter Short rare episodes of SVT  On review of her medications today, She is not on diltiazem , was not refilled by PMD Reports when her refills ran out after 30 days she stopped taking the  medication  Low weight, on remeron  Has not been checking her blood pressure at home Denies any tachypalpitations concerning for arrhythmia Reports that she used to take metoprolol succinate 50 twice daily  EKG personally reviewed by myself on todays visit Normal sinus rhythm rate 74 bpm no significant ST-T wave changes    PMH:   has a past medical history of Cancer ([place]), Constipation, Coronary artery disease, Edema, Hypertension, and Myocardial infarction ([place]).  PSH:    Past Surgical History:  Procedure Laterality Date   BACK SURGERY     CARDIAC CATHETERIZATION N/A 06/11/2015   Procedure: Left Heart Cath and Coronary Angiography;  Surgeon: Charolette Forward, MD;  Location: [place] CV LAB;  Service: Cardiovascular;  Laterality: N/A;   CARDIAC CATHETERIZATION N/A 06/11/2015   Procedure: Coronary Stent Intervention;  Surgeon: Charolette Forward, MD;  Location: [place] CV LAB;  Service: Cardiovascular;  Laterality: N/A;  Distal RCA- 3.5x33 Xience   CATARACT EXTRACTION W/PHACO Left 12/07/2016   Procedure: CATARACT EXTRACTION PHACO AND INTRAOCULAR LENS PLACEMENT (IOC);  Surgeon: Leandrew Koyanagi, MD;  Location: [facility] ORS;  Service: Ophthalmology;  Laterality: Left;  [place] 00:44.3 AP% 16.1 CDE 7.11 Fluid Pack lot # [id number]   CATARACT EXTRACTION W/PHACO Right 01/11/2017   Procedure: CATARACT EXTRACTION PHACO AND INTRAOCULAR LENS PLACEMENT (IOC);  Surgeon: Leandrew Koyanagi, MD;  Location: [facility] ORS;  Service: Ophthalmology;  Laterality: Right;  Lot # [id number] H [place]: 00:46.6 AP%: 18.4 CDE: 8.56   COLONOSCOPY     COLONOSCOPY WITH PROPOFOL N/A 02/01/2018   Procedure: COLONOSCOPY WITH PROPOFOL;  Surgeon: Vira Agar,  Gavin Pound, MD;  Location: [facility] ENDOSCOPY;  Service: Endoscopy;  Laterality: N/A;   CORONARY ANGIOPLASTY     STENT 1/17   LUMBAR LAMINECTOMY/DECOMPRESSION MICRODISCECTOMY Right 03/07/2013   Procedure: LUMBAR LAMINECTOMY/DECOMPRESSION MICRODISCECTOMY LUMBAR FOUR-FIVE;  Surgeon: Charlie Pitter,  MD;  Location: [place] NEURO ORS;  Service: Neurosurgery;  Laterality: Right;    Current Outpatient Medications  Medication Sig Dispense Refill   acetaminophen (TYLENOL) 500 MG tablet Take 1,000 mg by mouth at bedtime as needed.     aspirin EC 81 MG EC tablet Take 1 tablet (81 mg total) by mouth daily. 30 tablet 3   atorvastatin (LIPITOR) 40 MG tablet Take 40 mg by mouth daily.     Cholecalciferol (VITAMIN D PO) Take 1,000 Units by mouth every morning.      clobetasol cream (TEMOVATE) [id number] % Apply 1 Application topically 2 (two) times daily.     cycloSPORINE (RESTASIS) 0.05 % ophthalmic emulsion Place 1 drop into both eyes 2 (two) times daily.     ELIQUIS 2.5 MG TABS tablet TAKE 1 TABLET BY MOUTH TWICE A DAY 60 tablet 0   losartan-hydrochlorothiazide (HYZAAR) 100-12.5 MG tablet Take 1 tablet by mouth daily.     metoprolol succinate (TOPROL-XL) 100 MG 24 hr tablet Take 100 mg by mouth daily. Take with or immediately following a meal.     mirtazapine (REMERON) 7.5 MG tablet Take 7.5 mg by mouth at bedtime.     nitroGLYCERIN (NITROSTAT) 0.4 MG SL tablet Place 1 tablet (0.4 mg total) under the tongue every 5 (five) minutes x 3 doses as needed for chest pain. 25 tablet 12   senna (SENOKOT) 8.6 MG tablet Take 1 tablet by mouth daily as needed.     No current facility-administered medications for this visit.     Allergies:   Adhesive [tape], Shellfish allergy, Shrimp extract, and Sulfa antibiotics   Social History:  The patient  reports that she has never smoked. She has never used smokeless tobacco. She reports that she does not drink alcohol and does not use drugs.   Family History:   family history includes Breast cancer in her maternal aunt; Breast cancer (age of onset: 12) in her mother; Heart attack in her mother; Throat cancer in her father.    Review of Systems: Review of Systems  Constitutional:  Positive for weight loss.  HENT: Negative.    Respiratory: Negative.    Cardiovascular:  Negative.   Gastrointestinal: Negative.   Musculoskeletal: Negative.   Neurological: Negative.   Psychiatric/Behavioral: Negative.    All other systems reviewed and are negative.    PHYSICAL EXAM: VS:  BP (!) 160/80 (BP Location: Right Arm, Patient Position: Sitting, Cuff Size: Normal)   Pulse 74   Ht 5\' 6"  (1.676 m)   Wt 100 lb 4 oz (45.5 kg)   SpO2 96%   BMI 16.18 kg/m  , BMI Body mass index is 16.18 kg/m. GEN: Well nourished, well developed, in no acute distress HEENT: normal Neck: no JVD, carotid bruits, or masses Cardiac: RRR; no murmurs, rubs, or gallops,no edema  Respiratory:  clear to auscultation bilaterally, normal work of breathing GI: soft, nontender, nondistended, + BS MS: no deformity or atrophy Skin: warm and dry, no rash Neuro:  Strength and sensation are intact Psych: euthymic mood, full affect   Recent Labs: 07/03/2022: B Natriuretic Peptide 148.4; TSH 1.231 07/04/2022: BUN 22; Creatinine, Ser 0.97; Hemoglobin 12.7; Magnesium 1.9; Platelets 140; Potassium 3.1; Sodium 137    Lipid Panel  Lab Results  Component Value Date   CHOL 173 06/12/2015   HDL 74 06/12/2015   LDLCALC 90 06/12/2015   TRIG 44 06/12/2015      Wt Readings from Last 3 Encounters:  08/21/22 100 lb 4 oz (45.5 kg)  07/03/22 105 lb (47.6 kg)  06/16/20 112 lb (50.8 kg)      ASSESSMENT AND PLAN:  Problem List Items Addressed This Visit       Cardiology Problems   HTN (hypertension), benign   Relevant Medications   metoprolol succinate (TOPROL-XL) 100 MG 24 hr tablet   Coronary artery disease   Relevant Medications   metoprolol succinate (TOPROL-XL) 100 MG 24 hr tablet   Atrial flutter with rapid ventricular response (HCC) - Primary   Relevant Medications   metoprolol succinate (TOPROL-XL) 100 MG 24 hr tablet     Other   Dyslipidemia   Other Visit Diagnoses     Atrial fibrillation, unspecified type (HCC)       Relevant Medications   metoprolol succinate (TOPROL-XL) 100  MG 24 hr tablet      Persistent atrial fibrillation Converted to normal sinus rhythm in the hospital on beta-blocker and diltiazem Was discharged on metoprolol and diltiazem, today reports she is not on the diltiazem only on the metoprolol succinate 50 Recommend she increase metoprolol succinate up to 50 twice daily Continue on Eliquis She does report having some lower extremity edema, will not restart the calcium channel blocker  Essential hypertension Blood pressure elevated on today's visit, will increase metoprolol succinate back to 50 twice daily.  This was the dose she was on previously Recommend she closely monitor blood pressure at home and call us with numbers  Coronary artery disease with stable angina Currently with no symptoms of angina. No further workup at this time. Continue current medication regimen. Cholesterol above goal A1c 6.0  Hyperlipidemia Recommend she continue Lipitor, add Zetia 10 mg daily to achieve goal LDL 60 or less Current total cholesterol above goal    Total encounter time more than 60 minutes  Greater than 50% was spent in counseling and coordination of care with the patient    Signed, Esmond Plants, M.D., Ph.D. [place], [place]

## 2022-08-21 ENCOUNTER — Encounter: Payer: Self-pay | Admitting: Cardiovascular Disease

## 2022-08-21 ENCOUNTER — Ambulatory Visit: Payer: Medicare Other | Attending: Cardiovascular Disease | Admitting: Cardiovascular Disease

## 2022-08-21 VITALS — BP 130/80 | HR 74 | Ht 66.0 in | Wt 100.2 lb

## 2022-08-21 DIAGNOSIS — I4892 Unspecified atrial flutter: Secondary | ICD-10-CM

## 2022-08-21 DIAGNOSIS — I4891 Unspecified atrial fibrillation: Secondary | ICD-10-CM | POA: Diagnosis not present

## 2022-08-21 DIAGNOSIS — I25118 Atherosclerotic heart disease of native coronary artery with other forms of angina pectoris: Secondary | ICD-10-CM

## 2022-08-21 DIAGNOSIS — E785 Hyperlipidemia, unspecified: Secondary | ICD-10-CM

## 2022-08-21 DIAGNOSIS — I10 Essential (primary) hypertension: Secondary | ICD-10-CM

## 2022-08-21 MED ORDER — EZETIMIBE 10 MG PO TABS: 10.0000 mg | ORAL_TABLET | Freq: Every day | ORAL | 1 refills | Status: DC

## 2022-08-21 MED ORDER — LOSARTAN POTASSIUM-HCTZ 100-12.5 MG PO TABS: 1.0000 | ORAL_TABLET | Freq: Every day | ORAL | 1 refills | Status: DC

## 2022-08-21 MED ORDER — ATORVASTATIN CALCIUM 40 MG PO TABS: 40.0000 mg | ORAL_TABLET | Freq: Every day | ORAL | 1 refills | Status: DC

## 2022-08-21 MED ORDER — METOPROLOL SUCCINATE ER 50 MG PO TB24: 50.0000 mg | ORAL_TABLET | Freq: Two times a day (BID) | ORAL | 1 refills | Status: DC

## 2022-08-21 NOTE — Patient Instructions (Addendum)
Check blood pressure late morning Write down numbers  Medication Instructions:  Metoprolol succinate 50 mg twice a day for atrial fibrillation Please start zetia 10 mg daily for cholesterol  If you need a refill on your cardiac medications before your next appointment, please call your pharmacy.   Lab work: No new labs needed  Testing/Procedures: No new testing needed  Follow-Up: At [facility], you and your health needs are our priority.  As part of our continuing mission to provide you with exceptional heart care, we have created designated Provider Care Teams.  These Care Teams include your primary Cardiologist (physician) and Advanced Practice Providers (APPs -  Physician Assistants and Nurse Practitioners) who all work together to provide you with the care you need, when you need it.  You will need a follow up appointment in 6 months  Providers on your designated Care Team:   Murray Hodgkins, NP Christell Faith, PA-C Cadence Kathlen Mody, [place]  COVID-19 Vaccine Information can be found at: [email or web address].uk For questions related to vaccine distribution or appointments, please email [email or web address]@ [email or web address] or call [phone].

## 2022-09-06 ENCOUNTER — Other Ambulatory Visit: Payer: Self-pay | Admitting: Cardiovascular Disease

## 2022-09-06 NOTE — Telephone Encounter (Signed)
Prescription refill request for Eliquis received. Indication: A Flutter Last office visit: 08/21/22  Johnny Bridge MD Scr: 0.97 on 07/04/22  Epic Age: 82 Weight: 45.5kg  Based on above findings Eliquis 2.5mg  twice daily is the appropriate dose.  Refill approved.

## 2023-01-02 ENCOUNTER — Other Ambulatory Visit: Payer: Self-pay | Admitting: Cardiovascular Disease

## 2023-02-15 ENCOUNTER — Other Ambulatory Visit: Payer: Self-pay | Admitting: Cardiovascular Disease

## 2023-02-20 NOTE — Progress Notes (Signed)
Cardiology Office Note  Date:  02/23/2023   ID:  Mandy Gilbert, DOB [birth year], MRN [id number]  PCP:  Lauro Regulus, MD   Chief Complaint  Patient presents with   6 month follow up     "Doing well." Medications reviewed by the patient verbally.     HPI:  Mandy Gilbert is an 82 year old woman with history of  CAD status post PCI to the mid and distal RCA in 2017,  COPD,  carotid artery stenosis,  hypertension,   hyperlipidemia,  Paroxysmal atrial flutter/fibrillation in the hospital January 2024 Who presents for follow-up of her paroxysmal atrial fib/flutter  Last seen by myself in clinic January 2024 In follow-up today she reports that she is active Weights and activities with the church, cleans house,  women club  Initial blood pressure markedly elevated 180 systolic Does not check blood pressure at home  Denies hip sowed's of tachycardia concerning for atrial fibrillation/flutter Periodically gets dizzy, when standing in house, sits down and symptoms resolved  Does not eat much Weight down 5 pounds from March 2024 now 95 pounds " I know I need to eat or" Clothes or hanging off her She does not miss meals just does not eat very much  Lab work reviewed A1C 6.0 Total chol 170, LDL 68 CR 1.2  EKG personally reviewed by myself on todays visit EKG Interpretation Date/Time:  Friday February 23 2023 11:07:56 EDT Ventricular Rate:  69 PR Interval:  164 QRS Duration:  78 QT Interval:  422 QTC Calculation: 452 R Axis:   85  Text Interpretation: Normal sinus rhythm with sinus arrhythmia Biatrial enlargement When compared with ECG of 04-Jul-2022 07:02, No significant change was found Confirmed by Julien Nordmann [phone]) on 02/23/2023 11:15:00 AM   Other past medical history reviewed Admitted to the hospital in January 2024 after symptoms of intermittent dizziness as well as marked fatigue  She saw her PCP for dizziness, fatigue, chest pain. Outpatient  troponin was minimally elevated, prompting referral to the ED. She was initially noted to be hypertensive with a blood pressure of 177/116. Initial EKG showed sinus tachycardia and PACs. She subsequently developed atrial flutter on telemetry with conversion to atrial fibrillation. She was continued on her home dose of oral metoprolol and also given IV diltiazem. This was transitioned to oral diltiazem after she spontaneously converted back to sinus rhythm.   Normal echocardiogram Discharged on metoprolol succinate 50 with diltiazem ER 120 daily, Eliquis 2.5 twice daily CHA2DS2-VASc score of at least 5 (reduced dose with age > 80 and wt < 60 kg).   Prior Myoview September 2021, no ischemia  Zio monitor at discharge with no significant A-fib flutter Short rare episodes of SVT  PMH:   has a past medical history of Cancer (HCC), Constipation, Coronary artery disease, Edema, Hypertension, and Myocardial infarction (HCC).  PSH:    Past Surgical History:  Procedure Laterality Date   BACK SURGERY     CARDIAC CATHETERIZATION N/A 06/11/2015   Procedure: Left Heart Cath and Coronary Angiography;  Surgeon: Rinaldo Cloud, MD;  Location: [facility] INVASIVE CV LAB;  Service: Cardiovascular;  Laterality: N/A;   CARDIAC CATHETERIZATION N/A 06/11/2015   Procedure: Coronary Stent Intervention;  Surgeon: Rinaldo Cloud, MD;  Location: [facility] INVASIVE CV LAB;  Service: Cardiovascular;  Laterality: N/A;  Distal RCA- 3.5x33 Xience   CATARACT EXTRACTION W/PHACO Left 12/07/2016   Procedure: CATARACT EXTRACTION PHACO AND INTRAOCULAR LENS PLACEMENT (IOC);  Surgeon: Lockie Mola, MD;  Location: [facility] ORS;  Service:  Ophthalmology;  Laterality: Left;  [place] 00:44.3 AP% 16.1 CDE 7.11 Fluid Pack lot # [id number]   CATARACT EXTRACTION W/PHACO Right 01/11/2017   Procedure: CATARACT EXTRACTION PHACO AND INTRAOCULAR LENS PLACEMENT (IOC);  Surgeon: Lockie Mola, MD;  Location: [facility] ORS;  Service: Ophthalmology;  Laterality: Right;  Lot  # [id number] H [place]: 00:46.6 AP%: 18.4 CDE: 8.56   COLONOSCOPY     COLONOSCOPY WITH PROPOFOL N/A 02/01/2018   Procedure: COLONOSCOPY WITH PROPOFOL;  Surgeon: Scot Jun, MD;  Location: [facility] ENDOSCOPY;  Service: Endoscopy;  Laterality: N/A;   CORONARY ANGIOPLASTY     STENT 1/17   LUMBAR LAMINECTOMY/DECOMPRESSION MICRODISCECTOMY Right 03/07/2013   Procedure: LUMBAR LAMINECTOMY/DECOMPRESSION MICRODISCECTOMY LUMBAR FOUR-FIVE;  Surgeon: Temple Pacini, MD;  Location: [facility] NEURO ORS;  Service: Neurosurgery;  Laterality: Right;    Current Outpatient Medications  Medication Sig Dispense Refill   acetaminophen (TYLENOL) 500 MG tablet Take 1,000 mg by mouth at bedtime as needed.     apixaban (ELIQUIS) 2.5 MG TABS tablet TAKE 1 TABLET BY MOUTH TWICE A DAY 60 tablet 5   aspirin EC 81 MG EC tablet Take 1 tablet (81 mg total) by mouth daily. 30 tablet 3   atorvastatin (LIPITOR) 40 MG tablet Take 1 tablet (40 mg total) by mouth daily. 90 tablet 1   Cholecalciferol (VITAMIN D PO) Take 1,000 Units by mouth every morning.      clobetasol cream (TEMOVATE) 0.05 % Apply 1 Application topically 2 (two) times daily.     cycloSPORINE (RESTASIS) 0.05 % ophthalmic emulsion Place 1 drop into both eyes 2 (two) times daily.     ezetimibe (ZETIA) 10 MG tablet TAKE 1 TABLET BY MOUTH EVERY DAY 90 tablet 0   losartan-hydrochlorothiazide (HYZAAR) 100-12.5 MG tablet Take 1 tablet by mouth daily. 90 tablet 1   metoprolol succinate (TOPROL-XL) 50 MG 24 hr tablet TAKE 1 TABLET BY MOUTH IN THE MORNING AND AT BEDTIME. TAKE WITH OR IMMEDIATELY FOLLOWING A MEAL. 180 tablet 0   mirtazapine (REMERON) 7.5 MG tablet Take 7.5 mg by mouth at bedtime.     nitroGLYCERIN (NITROSTAT) 0.4 MG SL tablet Place 1 tablet (0.4 mg total) under the tongue every 5 (five) minutes x 3 doses as needed for chest pain. 25 tablet 12   senna (SENOKOT) 8.6 MG tablet Take 1 tablet by mouth daily as needed.     No current facility-administered medications for  this visit.    Allergies:   Adhesive [tape], Shellfish allergy, Shrimp extract, and Sulfa antibiotics   Social History:  The patient  reports that she has never smoked. She has never used smokeless tobacco. She reports that she does not drink alcohol and does not use drugs.   Family History:   family history includes Breast cancer in her maternal aunt; Breast cancer (age of onset: 61) in her mother; Heart attack in her mother; Throat cancer in her father.    Review of Systems: Review of Systems  Constitutional:  Positive for weight loss.  HENT: Negative.    Respiratory: Negative.    Cardiovascular: Negative.   Gastrointestinal: Negative.   Musculoskeletal: Negative.   Neurological: Negative.   Psychiatric/Behavioral: Negative.    All other systems reviewed and are negative.    PHYSICAL EXAM: VS:  BP (!) 183/99 (BP Location: Left Arm, Patient Position: Sitting, Cuff Size: Normal)   Pulse 69   Ht 5\' 6"  (1.676 m)   Wt 95 lb 8 oz (43.3 kg)   SpO2 97%   BMI 15.41  kg/m  , BMI Body mass index is 15.41 kg/m. Constitutional:  oriented to person, place, and time. No distress.  HENT:  Head: Grossly normal Eyes:  no discharge. No scleral icterus.  Neck: No JVD, no carotid bruits  Cardiovascular: Regular rate and rhythm, no murmurs appreciated Pulmonary/Chest: Clear to auscultation bilaterally, no wheezes or rails Abdominal: Soft.  no distension.  no tenderness.  Musculoskeletal: Normal range of motion Neurological:  normal muscle tone. Coordination normal. No atrophy Skin: Skin warm and dry Psychiatric: normal affect, pleasant   Recent Labs: 07/03/2022: B Natriuretic Peptide 148.4; TSH 1.231 07/04/2022: BUN 22; Creatinine, Ser 0.97; Hemoglobin 12.7; Magnesium 1.9; Platelets 140; Potassium 3.1; Sodium 137    Lipid Panel Lab Results  Component Value Date   CHOL 173 06/12/2015   HDL 74 06/12/2015   LDLCALC 90 06/12/2015   TRIG 44 06/12/2015      Wt Readings from Last 3  Encounters:  02/23/23 95 lb 8 oz (43.3 kg)  08/21/22 100 lb 4 oz (45.5 kg)  07/03/22 105 lb (47.6 kg)      ASSESSMENT AND PLAN:  Problem List Items Addressed This Visit       Cardiology Problems   HTN (hypertension), benign   Relevant Orders   EKG 12-Lead (Completed)   Coronary artery disease   Relevant Orders   EKG 12-Lead (Completed)   Atrial flutter with rapid ventricular response (HCC) - Primary   Relevant Orders   EKG 12-Lead (Completed)     Other   Dyslipidemia   Other Visit Diagnoses     Atrial fibrillation, unspecified type (HCC)       Relevant Orders   EKG 12-Lead (Completed)       Persistent atrial fibrillation Maintaining normal sinus rhythm on current regiment Recommend she continue anticoagulation Eliquis 2.5 twice daily with metoprolol succinate 50 twice daily  Essential hypertension Has on last clinic visit, blood pressure markedly elevated on arrival though did improve on recheck Initial systolic pressure in the 180 range down to 125 systolic on my recheck Diastolic remains mildly elevated at 90 Recommended she monitor blood pressure 3 times a week at home and call us if numbers are elevated No medication changes made Recommend she add potassium 10 mill equivalents daily with her HCTZ  Coronary artery disease with stable angina Currently with no symptoms of angina. No further workup at this time. Continue current medication regimen. Cholesterol above goal A1c 6.0  Hyperlipidemia Continue Lipitor with Zetia Goal LDL less than 70    Total encounter time more than 30 minutes  Greater than 50% was spent in counseling and coordination of care with the patient    Signed, Dossie Arbour, M.D., Ph.D. [facility] [place], [place] [phone]

## 2023-02-23 ENCOUNTER — Ambulatory Visit: Payer: Medicare Other | Attending: Cardiovascular Disease | Admitting: Cardiovascular Disease

## 2023-02-23 ENCOUNTER — Encounter: Payer: Self-pay | Admitting: Cardiovascular Disease

## 2023-02-23 VITALS — BP 125/90 | HR 69 | Ht 66.0 in | Wt 95.5 lb

## 2023-02-23 DIAGNOSIS — E785 Hyperlipidemia, unspecified: Secondary | ICD-10-CM

## 2023-02-23 DIAGNOSIS — I10 Essential (primary) hypertension: Secondary | ICD-10-CM | POA: Diagnosis not present

## 2023-02-23 DIAGNOSIS — I25118 Atherosclerotic heart disease of native coronary artery with other forms of angina pectoris: Secondary | ICD-10-CM | POA: Diagnosis not present

## 2023-02-23 DIAGNOSIS — I4892 Unspecified atrial flutter: Secondary | ICD-10-CM | POA: Diagnosis not present

## 2023-02-23 DIAGNOSIS — I4891 Unspecified atrial fibrillation: Secondary | ICD-10-CM

## 2023-02-23 MED ORDER — METOPROLOL SUCCINATE ER 50 MG PO TB24: 50.0000 mg | ORAL_TABLET | Freq: Two times a day (BID) | ORAL | 3 refills | Status: DC

## 2023-02-23 MED ORDER — EZETIMIBE 10 MG PO TABS: 10.0000 mg | ORAL_TABLET | Freq: Every day | ORAL | 3 refills | Status: DC

## 2023-02-23 MED ORDER — ATORVASTATIN CALCIUM 40 MG PO TABS: 40.0000 mg | ORAL_TABLET | Freq: Every day | ORAL | 3 refills | Status: DC

## 2023-02-23 MED ORDER — LOSARTAN POTASSIUM-HCTZ 100-12.5 MG PO TABS: 1.0000 | ORAL_TABLET | Freq: Every day | ORAL | 3 refills | Status: DC

## 2023-02-23 MED ORDER — POTASSIUM CHLORIDE ER 10 MEQ PO TBCR: 10.0000 meq | EXTENDED_RELEASE_TABLET | Freq: Every day | ORAL | 3 refills | Status: DC

## 2023-02-23 NOTE — Patient Instructions (Addendum)
Medication Instructions:  Potassium 10 meq daily OK to stop aspirin  If you need a refill on your cardiac medications before your next appointment, please call your pharmacy.   Lab work: No new labs needed  Testing/Procedures: No new testing needed  Follow-Up: At [facility], you and your health needs are our priority.  As part of our continuing mission to provide you with exceptional heart care, we have created designated Provider Care Teams.  These Care Teams include your primary Cardiologist (physician) and Advanced Practice Providers (APPs -  Physician Assistants and Nurse Practitioners) who all work together to provide you with the care you need, when you need it.  You will need a follow up appointment in 12 months  Providers on your designated Care Team:   Nicolasa Ducking, NP Eula Listen, PA-C Cadence Fransico Michael, [place]  COVID-19 Vaccine Information can be found at: [email or web address].nl For questions related to vaccine distribution or appointments, please email [email or web address] or call [phone].

## 2023-03-01 ENCOUNTER — Other Ambulatory Visit: Payer: Self-pay | Admitting: Cardiovascular Disease

## 2023-03-01 NOTE — Telephone Encounter (Signed)
Refill request

## 2023-03-01 NOTE — Telephone Encounter (Signed)
Prescription refill request for Eliquis received. Indication: A Flutter Last office visit: 02/23/23  Concha Se MD Scr: 1.2 on 12/12/22  Epic Age: 82 Weight: 43.3kg  Based on above findings Eliquis 2.5mg  twice daily is the appropriate dose.  Refill approved.

## 2023-07-10 ENCOUNTER — Other Ambulatory Visit: Payer: Self-pay | Admitting: Neurology

## 2023-07-10 DIAGNOSIS — G3184 Mild cognitive impairment, so stated: Secondary | ICD-10-CM

## 2023-07-12 ENCOUNTER — Ambulatory Visit
Admission: RE | Admit: 2023-07-12 | Discharge: 2023-07-12 | Disposition: A | Discharge status: Home / Self Care | Payer: Medicare Other | Source: Ambulatory Visit | Attending: Neurology | Admitting: Neurology

## 2023-07-12 DIAGNOSIS — G3184 Mild cognitive impairment, so stated: Secondary | ICD-10-CM | POA: Diagnosis present

## 2023-08-10 ENCOUNTER — Other Ambulatory Visit: Payer: Self-pay | Admitting: Cardiovascular Disease

## 2023-08-10 NOTE — Telephone Encounter (Signed)
 Prescription refill request for Eliquis received. Indication:aflutter Last office visit:9/24 Scr:1.2  7/24 Age: 83 Weight:43.3  kg  Prescription refilled

## 2023-11-19 ENCOUNTER — Emergency Department

## 2023-11-19 ENCOUNTER — Observation Stay

## 2023-11-19 ENCOUNTER — Observation Stay
Admission: EM | Admit: 2023-11-19 | Discharge: 2023-11-20 | Disposition: A | Discharge status: Home Health | Attending: Family Medicine | Admitting: Family Medicine

## 2023-11-19 ENCOUNTER — Other Ambulatory Visit: Payer: Self-pay

## 2023-11-19 DIAGNOSIS — R0989 Other specified symptoms and signs involving the circulatory and respiratory systems: Secondary | ICD-10-CM | POA: Diagnosis not present

## 2023-11-19 DIAGNOSIS — I6529 Occlusion and stenosis of unspecified carotid artery: Secondary | ICD-10-CM | POA: Diagnosis not present

## 2023-11-19 DIAGNOSIS — Z79899 Other long term (current) drug therapy: Secondary | ICD-10-CM | POA: Insufficient documentation

## 2023-11-19 DIAGNOSIS — R42 Dizziness and giddiness: Secondary | ICD-10-CM | POA: Diagnosis not present

## 2023-11-19 DIAGNOSIS — I2510 Atherosclerotic heart disease of native coronary artery without angina pectoris: Secondary | ICD-10-CM | POA: Insufficient documentation

## 2023-11-19 DIAGNOSIS — I10 Essential (primary) hypertension: Secondary | ICD-10-CM | POA: Diagnosis not present

## 2023-11-19 DIAGNOSIS — G309 Alzheimer's disease, unspecified: Secondary | ICD-10-CM | POA: Diagnosis not present

## 2023-11-19 DIAGNOSIS — I160 Hypertensive urgency: Secondary | ICD-10-CM | POA: Diagnosis not present

## 2023-11-19 DIAGNOSIS — E785 Hyperlipidemia, unspecified: Secondary | ICD-10-CM | POA: Diagnosis not present

## 2023-11-19 DIAGNOSIS — I4892 Unspecified atrial flutter: Secondary | ICD-10-CM | POA: Diagnosis present

## 2023-11-19 DIAGNOSIS — Z85828 Personal history of other malignant neoplasm of skin: Secondary | ICD-10-CM | POA: Insufficient documentation

## 2023-11-19 DIAGNOSIS — F0280 Dementia in other diseases classified elsewhere without behavioral disturbance: Secondary | ICD-10-CM | POA: Insufficient documentation

## 2023-11-19 DIAGNOSIS — I779 Disorder of arteries and arterioles, unspecified: Secondary | ICD-10-CM | POA: Diagnosis present

## 2023-11-19 DIAGNOSIS — Z681 Body mass index (BMI) 19 or less, adult: Secondary | ICD-10-CM | POA: Insufficient documentation

## 2023-11-19 DIAGNOSIS — Z7901 Long term (current) use of anticoagulants: Secondary | ICD-10-CM | POA: Diagnosis not present

## 2023-11-19 DIAGNOSIS — G459 Transient cerebral ischemic attack, unspecified: Principal | ICD-10-CM

## 2023-11-19 DIAGNOSIS — Z955 Presence of coronary angioplasty implant and graft: Secondary | ICD-10-CM | POA: Diagnosis not present

## 2023-11-19 LAB — COMPREHENSIVE METABOLIC PANEL WITH GFR
ALT: 31 U/L (ref 0–44)
AST: 28 U/L (ref 15–41)
Albumin: 4.3 g/dL (ref 3.5–5.0)
Alkaline Phosphatase: 66 U/L (ref 38–126)
Anion gap: 12 (ref 5–15)
BUN: 27 mg/dL — ABNORMAL HIGH (ref 8–23)
CO2: 29 mmol/L (ref 22–32)
Calcium: 10.2 mg/dL (ref 8.9–10.3)
Chloride: 97 mmol/L — ABNORMAL LOW (ref 98–111)
Creatinine, Ser: 0.99 mg/dL (ref 0.44–1.00)
GFR, Estimated: 57 mL/min — ABNORMAL LOW (ref >60)
Glucose, Bld: 111 mg/dL — ABNORMAL HIGH (ref 70–99)
Potassium: 4 mmol/L (ref 3.5–5.1)
Sodium: 138 mmol/L (ref 135–145)
Total Bilirubin: 1 mg/dL (ref 0.0–1.2)
Total Protein: 7.5 g/dL (ref 6.5–8.1)

## 2023-11-19 LAB — CBC
HCT: 45.4 % (ref 36.0–46.0)
Hemoglobin: 15 g/dL (ref 12.0–15.0)
MCH: 31.4 pg (ref 26.0–34.0)
MCHC: 33 g/dL (ref 30.0–36.0)
MCV: 95.2 fL (ref 80.0–100.0)
Platelets: 160 10*3/uL (ref 150–400)
RBC: 4.77 MIL/uL (ref 3.87–5.11)
RDW: 12.9 % (ref 11.5–15.5)
WBC: 5.3 10*3/uL (ref 4.0–10.5)
nRBC: 0 % (ref 0.0–0.2)

## 2023-11-19 LAB — TROPONIN I (HIGH SENSITIVITY)
Troponin I (High Sensitivity): 8 ng/L (ref <18)
Troponin I (High Sensitivity): 7 ng/L (ref <18)

## 2023-11-19 LAB — URINALYSIS, ROUTINE W REFLEX MICROSCOPIC
Bacteria, UA: NONE SEEN
Bilirubin Urine: NEGATIVE
Glucose, UA: NEGATIVE mg/dL
Hgb urine dipstick: NEGATIVE
Ketones, ur: NEGATIVE mg/dL
Nitrite: NEGATIVE
Protein, ur: NEGATIVE mg/dL
Specific Gravity, Urine: 1.027 (ref 1.005–1.030)
pH: 7 (ref 5.0–8.0)

## 2023-11-19 LAB — CREATININE, SERUM
Creatinine, Ser: 0.82 mg/dL (ref 0.44–1.00)
GFR, Estimated: >60 mL/min (ref >60)

## 2023-11-19 MED ORDER — ACETAMINOPHEN 325 MG PO TABS
650.0000 mg | ORAL_TABLET | Freq: Four times a day (QID) | ORAL | Status: DC | PRN
  Administered 2023-11-20 (×3): 650 mg via ORAL
  Filled 2023-11-19 (×3): qty 2

## 2023-11-19 MED ORDER — ENOXAPARIN SODIUM 40 MG/0.4ML IJ SOSY
40.0000 mg | PREFILLED_SYRINGE | INTRAMUSCULAR | Status: DC
  Administered 2023-11-19: 40 mg via SUBCUTANEOUS
  Filled 2023-11-19: qty 0.4

## 2023-11-19 MED ORDER — LABETALOL HCL 5 MG/ML IV SOLN
10.0000 mg | Freq: Once | INTRAVENOUS | Status: AC
  Administered 2023-11-19: 10 mg via INTRAVENOUS
  Filled 2023-11-19: qty 4

## 2023-11-19 MED ORDER — METOPROLOL SUCCINATE ER 50 MG PO TB24
50.0000 mg | ORAL_TABLET | Freq: Every day | ORAL | Status: DC
  Administered 2023-11-20: 50 mg via ORAL
  Filled 2023-11-19: qty 1

## 2023-11-19 MED ORDER — IOHEXOL 350 MG/ML SOLN
50.0000 mL | Freq: Once | INTRAVENOUS | Status: AC | PRN
Start: 2023-11-19 — End: 2023-11-19
  Administered 2023-11-19: 50 mL via INTRAVENOUS

## 2023-11-19 MED ORDER — ESMOLOL HCL-SODIUM CHLORIDE 2000 MG/100ML IV SOLN
25.0000 ug/kg/min | INTRAVENOUS | Status: DC
  Filled 2023-11-19: qty 100

## 2023-11-19 MED ORDER — ATORVASTATIN CALCIUM 20 MG PO TABS
40.0000 mg | ORAL_TABLET | Freq: Every day | ORAL | Status: DC
  Administered 2023-11-19: 40 mg via ORAL
  Filled 2023-11-19: qty 2

## 2023-11-19 MED ORDER — ENOXAPARIN SODIUM 30 MG/0.3ML IJ SOSY
30.0000 mg | PREFILLED_SYRINGE | INTRAMUSCULAR | Status: DC
Start: 2023-11-19 — End: 2023-11-19

## 2023-11-19 NOTE — ED Provider Notes (Signed)
.-----------------------------------------   5:28 PM on 11/19/2023 -----------------------------------------  Blood pressure (!) 199/95, pulse 72, temperature 98 F (36.7 C), resp. rate 18, height 5' 6 (1.676 m), weight 45.4 kg, SpO2 100%.  Assuming care from Dr. Peggi Bowels.  In short, Mandy Gilbert is a 83 y.o. female with a chief complaint of Hypertension and Dizziness .  Refer to the original H&P for additional details.  The current plan of care is to follow up CTA chest.  CT angio on my independent read shows palpable flap versus artifact, called radiology to get the read expedited.  On reassessment patient states that she has no chest pain this time, no numbness or weakness, does feel like her right hand is slightly colder than the left.  I did put in for a dose of IV labetalol given that she was hypertensive.  The read did come back, no evidence of dissection, dyspepsia small amount of frothy material in the midesophagus but no high-grade obstruction.  Patient has no nausea or vomiting at this time.  She is maintaining her secretions.  Will proceed with admission here.  Notified hospitalist.   Clinical Course as of 11/19/23 1747  Mon Nov 19, 2023  1743 CT Angio Chest Aorta W and/or Wo Contrast IMPRESSION: 1. No evidence of aortic dissection or aneurysm. 2. No pulmonary infarction. 3. Small amount of frothy material within the mid esophagus. No high-grade obstruction identified.   [TT]    Clinical Course User Index [TT] Shane Darling, MD      Shane Darling, MD 11/19/23 [phone]

## 2023-11-19 NOTE — ED Triage Notes (Addendum)
 Pt comes with c/o HTN and dizziness. Pt states this started last night. Pt had BP reader at home and it read in the 200s.   Pt states chest pain and heaviness in chest. Pt states it feels like a pressure. Pt states this started more so today. Pt denies any N/V. Pt is on thinner.

## 2023-11-19 NOTE — ED Provider Notes (Signed)
 [facility] Provider Note    Event Date/Time   First MD Initiated Contact with Patient 11/19/23 1240     (approximate)   History   Hypertension and Dizziness   HPI  Mandy Gilbert is a 83 y.o. female who comes in with dizziness and blurred vision.  Symptoms started last night.  According to patient she reports having intermittent episodes of this happening.  This been going on for over a week.  She states that yesterday she actually felt pretty good and when she went to bed she felt her normal self.  However this morning she noticed it happening again.  She does report that sometimes they happen at rest but sometimes are more with standing.  She does have a history of carotid stenosis and she does take Eliquis .  She denies any falls or hitting her head.  She denies her blood pressure typically runs this elevated.  She does report a little bit off-and-on chest congestion but really denies any pain.  Denies any numbness, tingling.   Physical Exam   Triage Vital Signs: ED Triage Vitals  Encounter Vitals Group     BP 11/19/23 1218 (!) 193/104     Girls Systolic BP Percentile --      Girls Diastolic BP Percentile --      Boys Systolic BP Percentile --      Boys Diastolic BP Percentile --      Pulse Rate 11/19/23 1218 73     Resp 11/19/23 1218 18     Temp 11/19/23 1218 98 F (36.7 C)     Temp src --      SpO2 11/19/23 1218 97 %     Weight 11/19/23 1217 100 lb (45.4 kg)     Height 11/19/23 1217 5' 6 (1.676 m)     Head Circumference --      Peak Flow --      Pain Score 11/19/23 1217 3     Pain Loc --      Pain Education --      Exclude from Growth Chart --     Most recent vital signs: Vitals:   11/19/23 1218 11/19/23 1244  BP: (!) 193/104 (!) 199/91  Pulse: 73   Resp: 18   Temp: 98 F (36.7 C)   SpO2: 97%      General: Awake, no distress.  CV:  Good peripheral perfusion.  No murmur Resp:  Normal effort.  Abd:  No distention.  Soft and  nontender Other:  Equal pulses.  Sensation intact bilaterally.   ED Results / Procedures / Treatments   Labs (all labs ordered are listed, but only abnormal results are displayed) Labs Reviewed  COMPREHENSIVE METABOLIC PANEL WITH GFR - Abnormal; Notable for the following components:      Result Value   Chloride 97 (*)    Glucose, Bld 111 (*)    BUN 27 (*)    GFR, Estimated 57 (*)    All other components within normal limits  CBC  URINALYSIS, ROUTINE W REFLEX MICROSCOPIC  CBC  CREATININE, SERUM  CBG MONITORING, ED  TROPONIN I (HIGH SENSITIVITY)  TROPONIN I (HIGH SENSITIVITY)     EKG  My interpretation of EKG:  Normal sinus rate of 66 without any ST elevation or T wave inversions, normal intervals  RADIOLOGY I have reviewed the ct personally and interpreted no evidence of intracranial hemorrhage   PROCEDURES:  Critical Care performed: No  .1-3 Lead EKG Interpretation  Performed by: Lubertha Rush, MD Authorized by: Lubertha Rush, MD     Interpretation: normal     ECG rate:  70   ECG rate assessment: normal     Rhythm: sinus rhythm     Ectopy: none     Conduction: normal      MEDICATIONS ORDERED IN ED: Medications  enoxaparin (LOVENOX) injection 30 mg (has no administration in time range)     IMPRESSION / MDM / ASSESSMENT AND PLAN / ED COURSE  I reviewed the triage vital signs and the nursing notes.   Patient's presentation is most consistent with acute presentation with potential threat to life or bodily function.   Patient comes in with concerns for dizziness, elevated blood pressure.  I am concerned the possibility of TIA versus stroke.  She does report a little bit of chest congestion as well but she really denies any pain.  She is got no numbness, no tingling equal pulses throughout no pain radiating to her back, new murmur to suggest dissection and this has been off and on for a few days now.  Orthostatics are negative.  BMP shows normal  creatinine.  CBC reassuring.  Troponin is negative   IMPRESSION: Atrophy, chronic microvascular disease.   No acute intracranial abnormality.  When I went to go reevaluate patient she had a little bit of discoloration of the right hand and I noted it was a little bit cooler in nature.  Therefore I have added on a CT angio chest  and alerted the hospitalist, Dr. Marnee Sink on and the nurse that patient needs CT imaging before patient can be admitted to our hospital.  Dr. Drenda Gentle will follow-up on these results.  Given I am leaving this we had off to oncoming team  The patient is on the cardiac monitor to evaluate for evidence of arrhythmia and/or significant heart rate changes.      FINAL CLINICAL IMPRESSION(S) / ED DIAGNOSES   Final diagnoses:  TIA (transient ischemic attack)  Dizziness     Rx / DC Orders   ED Discharge Orders     None        Note:  This document was prepared using Dragon voice recognition software and may include unintentional dictation errors.   Lubertha Rush, MD 11/19/23 [phone]

## 2023-11-19 NOTE — ED Notes (Signed)
 Patient transported to CT

## 2023-11-19 NOTE — H&P (Signed)
 History and Physical    Mandy Gilbert ZOX:[id number] DOB: 07-Jun-1940 DOA: 11/19/2023  DOS: the patient was seen and examined on 11/19/2023  PCP: Jimmy Moulding, MD   Patient coming from: Home  I have personally briefly reviewed patient's old medical records in [facility]  Chief Complaint: Dizziness  HPI: Mandy Gilbert is a pleasant 83 y.o. female with medical history significant for HTN, carotid stenosis, atrial fibrillation on Eliquis , and mild dementia who was brought in from home for evaluation of feeling dizzy and blood pressure reading was high in 200s at home.  Patient also complains some chest heaviness, pressure-like and started yesterday.  Patient denied any fever chills nausea vomiting diarrhea headache or loss of consciousness.  Patient has a history of carotid stenosis and takes Eliquis .  She denies any history of fall, numbness tingling or focal neurological deficits. In the ED patient was found to have a blood pressure of 193/104, EKG showed sinus rhythm no ST elevation, CT head showed no acute intracranial abnormality and hospitalist service was consulted for evaluation for admission for dizziness and elevated blood pressure. Patient was seen and examined at bedside in the emergency room.  Patient denied any chest pain or palpitations.  She felt that she is feeling okay.  No chest pain no palpitations no nausea no vomiting no headache, no tingling or numbness.  ED Course: Upon arrival to the ED, patient is found to have right hand slightly dusky coloration in the setting of mild chest pain.  CT angio of the chest was ordered result pending.  Review of Systems:  ROS  All other systems negative except as noted in the HPI.  Past Medical History:  Diagnosis Date   Cancer (HCC)    Skin cancer legs, basal cell.   Constipation    Coronary artery disease    Edema    ANKLE OCCAS   Hypertension    Myocardial infarction (HCC)    1/17    Past Surgical History:   Procedure Laterality Date   BACK SURGERY     CARDIAC CATHETERIZATION N/A 06/11/2015   Procedure: Left Heart Cath and Coronary Angiography;  Surgeon: Chapman Commodore, MD;  Location: [facility] INVASIVE CV LAB;  Service: Cardiovascular;  Laterality: N/A;   CARDIAC CATHETERIZATION N/A 06/11/2015   Procedure: Coronary Stent Intervention;  Surgeon: Chapman Commodore, MD;  Location: [facility] INVASIVE CV LAB;  Service: Cardiovascular;  Laterality: N/A;  Distal RCA- 3.5x33 Xience   CATARACT EXTRACTION W/PHACO Left 12/07/2016   Procedure: CATARACT EXTRACTION PHACO AND INTRAOCULAR LENS PLACEMENT (IOC);  Surgeon: Annell Kidney, MD;  Location: [facility] ORS;  Service: Ophthalmology;  Laterality: Left;  US  00:44.3 AP% 16.1 CDE 7.11 Fluid Pack lot # [id number]   CATARACT EXTRACTION W/PHACO Right 01/11/2017   Procedure: CATARACT EXTRACTION PHACO AND INTRAOCULAR LENS PLACEMENT (IOC);  Surgeon: Annell Kidney, MD;  Location: [facility] ORS;  Service: Ophthalmology;  Laterality: Right;  Lot # [id number] H US : 00:46.6 AP%: 18.4 CDE: 8.56   COLONOSCOPY     COLONOSCOPY WITH PROPOFOL  N/A 02/01/2018   Procedure: COLONOSCOPY WITH PROPOFOL ;  Surgeon: Cassie Click, MD;  Location: [facility] ENDOSCOPY;  Service: Endoscopy;  Laterality: N/A;   CORONARY ANGIOPLASTY     STENT 1/17   LUMBAR LAMINECTOMY/DECOMPRESSION MICRODISCECTOMY Right 03/07/2013   Procedure: LUMBAR LAMINECTOMY/DECOMPRESSION MICRODISCECTOMY LUMBAR FOUR-FIVE;  Surgeon: Baruch Bosch, MD;  Location: [facility] NEURO ORS;  Service: Neurosurgery;  Laterality: Right;     reports that she has never smoked. She has never used smokeless tobacco.  She reports that she does not drink alcohol and does not use drugs.  Allergies  Allergen Reactions   Adhesive [Tape] Itching and Other (See Comments)    Turns red.  Can use paper tape   Shellfish Allergy Nausea And Vomiting   Shrimp Extract Nausea Only and Nausea And Vomiting   Sulfa Antibiotics Rash    Family History  Problem Relation Age of Onset    Heart attack Mother    Breast cancer Mother 72   Throat cancer Father    Breast cancer Maternal Aunt     Prior to Admission medications   Medication Sig Start Date End Date Taking? Authorizing Provider  acetaminophen  (TYLENOL ) 500 MG tablet Take 1,000 mg by mouth at bedtime as needed.    [provider]  atorvastatin  (LIPITOR ) 40 MG tablet Take 1 tablet (40 mg total) by mouth daily. 02/23/23   Gollan, Timothy J, MD  Cholecalciferol  (VITAMIN D  PO) Take 1,000 Units by mouth every morning.     [provider]  clobetasol  cream (TEMOVATE ) 0.05 % Apply 1 Application topically 2 (two) times daily. 04/12/22   [provider]  cycloSPORINE  (RESTASIS ) 0.05 % ophthalmic emulsion Place 1 drop into both eyes 2 (two) times daily. 08/01/21   [provider]  ELIQUIS  2.5 MG TABS tablet TAKE 1 TABLET BY MOUTH TWICE A DAY 08/10/23   Gollan, Timothy J, MD  ezetimibe  (ZETIA ) 10 MG tablet Take 1 tablet (10 mg total) by mouth daily. 02/23/23   Gollan, Timothy J, MD  losartan -hydrochlorothiazide  (HYZAAR) 100-12.5 MG tablet Take 1 tablet by mouth daily. 02/23/23   Gollan, Timothy J, MD  metoprolol  succinate (TOPROL -XL) 50 MG 24 hr tablet Take 1 tablet (50 mg total) by mouth in the morning and at bedtime. Take with or immediately following a meal. 02/23/23   Gollan, Timothy J, MD  mirtazapine  (REMERON ) 7.5 MG tablet Take 7.5 mg by mouth at bedtime. 06/15/22   [provider]  nitroGLYCERIN  (NITROSTAT ) 0.4 MG SL tablet Place 1 tablet (0.4 mg total) under the tongue every 5 (five) minutes x 3 doses as needed for chest pain. 06/13/15   Chapman Commodore, MD  potassium chloride  (KLOR-CON ) 10 MEQ tablet Take 1 tablet (10 mEq total) by mouth daily. 02/23/23   Gollan, Timothy J, MD  senna (SENOKOT) 8.6 MG tablet Take 1 tablet by mouth daily as needed.    [provider]    Physical Exam: Vitals:   11/19/23 1217 11/19/23 1218 11/19/23 1244 11/19/23 1430  BP:  (!) 193/104 (!)  199/91 (!) 199/95  Pulse:  73  72  Resp:  18  18  Temp:  98 F (36.7 C)    SpO2:  97%  100%  Weight: 45.4 kg     Height: 5' 6 (1.676 m)       Physical Exam   Labs on Admission: I have personally reviewed following labs and imaging studies  CBC: Recent Labs  Lab 11/19/23 1226  WBC 5.3  HGB 15.0  HCT 45.4  MCV 95.2  PLT 160   Basic Metabolic Panel: Recent Labs  Lab 11/19/23 1226 11/19/23 1519  NA 138  --   K 4.0  --   CL 97*  --   CO2 29  --   GLUCOSE 111*  --   BUN 27*  --   CREATININE 0.99 0.82  CALCIUM  10.2  --    GFR: Estimated Creatinine Clearance: 37.3 mL/min (by C-G formula based on SCr of 0.82  mg/dL). Liver Function Tests: Recent Labs  Lab 11/19/23 1226  AST 28  ALT 31  ALKPHOS 66  BILITOT 1.0  PROT 7.5  ALBUMIN 4.3   No results for input(s): LIPASE, AMYLASE in the last 168 hours. No results for input(s): AMMONIA in the last 168 hours. Coagulation Profile: No results for input(s): INR, PROTIME in the last 168 hours. Cardiac Enzymes: Recent Labs  Lab 11/19/23 1226 11/19/23 1519  TROPONINIHS 8 7   BNP (last 3 results) No results for input(s): BNP in the last 8760 hours. HbA1C: No results for input(s): HGBA1C in the last 72 hours. CBG: No results for input(s): GLUCAP in the last 168 hours. Lipid Profile: No results for input(s): CHOL, HDL, LDLCALC, TRIG, CHOLHDL, LDLDIRECT in the last 72 hours. Thyroid  Function Tests: No results for input(s): TSH, T4TOTAL, FREET4, T3FREE, THYROIDAB in the last 72 hours. Anemia Panel: No results for input(s): VITAMINB12, FOLATE, FERRITIN, TIBC, IRON, RETICCTPCT in the last 72 hours. Urine analysis: No results found for: COLORURINE, APPEARANCEUR, LABSPEC, PHURINE, GLUCOSEU, HGBUR, BILIRUBINUR, KETONESUR, PROTEINUR, UROBILINOGEN, NITRITE, LEUKOCYTESUR  Radiological Exams on Admission: I have personally reviewed images CT Angio Chest  Aorta W and/or Wo Contrast Result Date: 11/19/2023 CLINICAL DATA:  Chest pain.  Concern for acute aortic syndrome EXAM: CT ANGIOGRAPHY CHEST WITH CONTRAST TECHNIQUE: Multidetector CT imaging of the chest was performed using the standard protocol during bolus administration of intravenous contrast. Multiplanar CT image reconstructions and MIPs were obtained to evaluate the vascular anatomy. RADIATION DOSE REDUCTION: This exam was performed according to the departmental dose-optimization program which includes automated exposure control, adjustment of the mA and/or kV according to patient size and/or use of iterative reconstruction technique. CONTRAST:  50mL OMNIPAQUE  IOHEXOL  350 MG/ML SOLN COMPARISON:  None Available. FINDINGS: Cardiovascular: No contour abnormality ascending, transverse, descending thoracic aorta to suggest aortic dissection or aneurysm. Normal great vessels. No pericardial fluid. Mediastinum/Nodes: No axillary or supraclavicular adenopathy. No mediastinal or hilar adenopathy. No pericardial fluid. Small amount of frothy material within the mid esophagus. No high-grade obstruction identified. Distal esophagus appears normal. Lungs/Pleura: No pulmonary infarction. No pneumonia. No pleural fluid. No pneumothorax Upper Abdomen: Limited view of the liver, kidneys, pancreas are unremarkable. Normal adrenal glands. Musculoskeletal: No aggressive osseous lesion. Review of the MIP images confirms the above findings. IMPRESSION: 1. No evidence of aortic dissection or aneurysm. 2. No pulmonary infarction. 3. Small amount of frothy material within the mid esophagus. No high-grade obstruction identified. Electronically Signed   By: Deboraha Fallow M.D.   On: 11/19/2023 17:35   CT Head Wo Contrast Result Date: 11/19/2023 CLINICAL DATA:  Dizziness, hypertension EXAM: CT HEAD WITHOUT CONTRAST TECHNIQUE: Contiguous axial images were obtained from the base of the skull through the vertex without intravenous  contrast. RADIATION DOSE REDUCTION: This exam was performed according to the departmental dose-optimization program which includes automated exposure control, adjustment of the mA and/or kV according to patient size and/or use of iterative reconstruction technique. COMPARISON:  None Available. FINDINGS: Brain: There is atrophy and chronic small vessel disease changes. No acute intracranial abnormality. Specifically, no hemorrhage, hydrocephalus, mass lesion, acute infarction, or significant intracranial injury. Vascular: No hyperdense vessel or unexpected calcification. Skull: No acute calvarial abnormality. Sinuses/Orbits: No acute findings Other: None IMPRESSION: Atrophy, chronic microvascular disease. No acute intracranial abnormality. Electronically Signed   By: Janeece Mechanic M.D.   On: 11/19/2023 14:33    EKG: My personal interpretation of EKG shows: Normal sinus rhythm    Assessment/Plan Principal Problem:  Dizziness Active Problems:   Atrial flutter with rapid ventricular response (HCC)   Carotid artery disease (HCC)   Dyslipidemia   Hypertensive urgency    83 year old female with past medical history of HTN, A-fib on Eliquis , carotid artery stenosis, HLD and mild dementia who was brought in to the hospital for feeling dizzy at home.  1. Uncontrolled hypertension/dizziness - Patient appears to be mildly symptomatic. - Exam showed no nystagmus - CT scan of the brain showed no acute finding - MRI of the brain is pending - She will be continued on her home medications including Eliquis . - Physical and Occupational Therapy evaluation - Telemetry monitoring - Monitor blood pressure  2.  History of atrial flutter fibrillation - Heart rate has been controlled - Continue to monitor heart rate - Continue metoprolol  XL 50 mg twice daily  3.  Hyper lipidemia - Continue Lipitor  and Zetia   4.  Dementia - Continue Aricept - Continue supportive care  Pending: MRI of the brain, CT  angio of the chest to rule out dissection   DVT prophylaxis: Lovenox Code Status: Full Code Family Communication: Patient and husband together at bedside Disposition Plan: Home Consults called: N/A Admission status: Observation, Telemetry bed   Beatris Bough, MD [facility] 11/19/2023, 5:38 PM

## 2023-11-20 ENCOUNTER — Observation Stay
Admit: 2023-11-20 | Discharge: 2023-11-20 | Disposition: A | Discharge status: Home / Self Care | Attending: Family Medicine | Admitting: Family Medicine

## 2023-11-20 ENCOUNTER — Other Ambulatory Visit

## 2023-11-20 DIAGNOSIS — I160 Hypertensive urgency: Secondary | ICD-10-CM

## 2023-11-20 DIAGNOSIS — E785 Hyperlipidemia, unspecified: Secondary | ICD-10-CM | POA: Diagnosis not present

## 2023-11-20 DIAGNOSIS — R42 Dizziness and giddiness: Secondary | ICD-10-CM | POA: Diagnosis not present

## 2023-11-20 DIAGNOSIS — I4892 Unspecified atrial flutter: Secondary | ICD-10-CM

## 2023-11-20 DIAGNOSIS — I779 Disorder of arteries and arterioles, unspecified: Secondary | ICD-10-CM | POA: Diagnosis not present

## 2023-11-20 LAB — CBC
HCT: 36.8 % (ref 36.0–46.0)
Hemoglobin: 12.1 g/dL (ref 12.0–15.0)
MCH: 30.5 pg (ref 26.0–34.0)
MCHC: 32.9 g/dL (ref 30.0–36.0)
MCV: 92.7 fL (ref 80.0–100.0)
Platelets: 124 10*3/uL — ABNORMAL LOW (ref 150–400)
RBC: 3.97 MIL/uL (ref 3.87–5.11)
RDW: 12.9 % (ref 11.5–15.5)
WBC: 4.7 10*3/uL (ref 4.0–10.5)
nRBC: 0 % (ref 0.0–0.2)

## 2023-11-20 LAB — BASIC METABOLIC PANEL WITH GFR
Anion gap: 7 (ref 5–15)
BUN: 25 mg/dL — ABNORMAL HIGH (ref 8–23)
CO2: 33 mmol/L — ABNORMAL HIGH (ref 22–32)
Calcium: 9.4 mg/dL (ref 8.9–10.3)
Chloride: 101 mmol/L (ref 98–111)
Creatinine, Ser: 0.93 mg/dL (ref 0.44–1.00)
GFR, Estimated: >60 mL/min (ref >60)
Glucose, Bld: 92 mg/dL (ref 70–99)
Potassium: 3.7 mmol/L (ref 3.5–5.1)
Sodium: 141 mmol/L (ref 135–145)

## 2023-11-20 LAB — LIPID PANEL
Cholesterol: 131 mg/dL (ref 0–200)
HDL: 70 mg/dL (ref >40)
LDL Cholesterol: 53 mg/dL (ref 0–99)
Total CHOL/HDL Ratio: 1.9 ratio
Triglycerides: 39 mg/dL (ref <150)
VLDL: 8 mg/dL (ref 0–40)

## 2023-11-20 LAB — ECHOCARDIOGRAM COMPLETE
Area-P 1/2: 3.36 cm2
Height: 66 in
S' Lateral: 1.58 cm
Weight: 1600 [oz_av]

## 2023-11-20 LAB — HEMOGLOBIN A1C
Hgb A1c MFr Bld: 5.7 % — ABNORMAL HIGH (ref 4.8–5.6)
Mean Plasma Glucose: 116.89 mg/dL

## 2023-11-20 MED ORDER — ENSURE PLUS HIGH PROTEIN PO LIQD
237.0000 mL | Freq: Two times a day (BID) | ORAL | Status: DC
  Administered 2023-11-20: 237 mL via ORAL

## 2023-11-20 MED ORDER — AMLODIPINE BESYLATE 5 MG PO TABS
5.0000 mg | ORAL_TABLET | Freq: Every day | ORAL | 0 refills | Status: DC
Start: 2023-11-20 — End: 2024-02-25

## 2023-11-20 NOTE — Plan of Care (Signed)

## 2023-11-20 NOTE — TOC Progression Note (Signed)
 Transition of Care [facility]) - Progression Note    Patient Details  Name: Mandy Gilbert MRN: [id number] Date of Birth: 09/15/1940  [facility] CM/SW Contact  Crayton Docker, RN 11/20/2023, 9:02 AM  Clinical Narrative:     Chart reviewed. No case management needs identified at this time. Case Management will continue to follow

## 2023-11-20 NOTE — Progress Notes (Signed)
 Bubble study performed along with ECHO done by tech. Pt. Tolerated well.

## 2023-11-20 NOTE — Plan of Care (Signed)
   Problem: Education: Goal: Knowledge of General Education information will improve Description: Including pain rating scale, medication(s)/side effects and non-pharmacologic comfort measures Outcome: Progressing   Problem: Activity: Goal: Risk for activity intolerance will decrease Outcome: Progressing   Problem: Nutrition: Goal: Adequate nutrition will be maintained Outcome: Progressing   Problem: Coping: Goal: Level of anxiety will decrease Outcome: Progressing   Problem: Skin Integrity: Goal: Risk for impaired skin integrity will decrease Outcome: Progressing

## 2023-11-20 NOTE — Plan of Care (Signed)
  Problem: Education: Goal: Knowledge of General Education information will improve Description: Including pain rating scale, medication(s)/side effects and non-pharmacologic comfort measures 11/20/2023 1732 by Cabella Kimm L, RN Outcome: Adequate for Discharge 11/20/2023 1152 by Tavi Gaughran L, RN Outcome: Progressing   Problem: Health Behavior/Discharge Planning: Goal: Ability to manage health-related needs will improve Outcome: Adequate for Discharge   Problem: Clinical Measurements: Goal: Ability to maintain clinical measurements within normal limits will improve Outcome: Adequate for Discharge Goal: Will remain free from infection Outcome: Adequate for Discharge Goal: Diagnostic test results will improve Outcome: Adequate for Discharge Goal: Respiratory complications will improve Outcome: Adequate for Discharge Goal: Cardiovascular complication will be avoided Outcome: Adequate for Discharge   Problem: Activity: Goal: Risk for activity intolerance will decrease 11/20/2023 1732 by Calyx Hawker L, RN Outcome: Adequate for Discharge 11/20/2023 1152 by Darroll Bredeson L, RN Outcome: Progressing   Problem: Nutrition: Goal: Adequate nutrition will be maintained 11/20/2023 1732 by Gianna Calef L, RN Outcome: Adequate for Discharge 11/20/2023 1152 by Nazaret Chea L, RN Outcome: Progressing   Problem: Coping: Goal: Level of anxiety will decrease 11/20/2023 1732 by Ople Girgis L, RN Outcome: Adequate for Discharge 11/20/2023 1152 by Ariyan Sinnett L, RN Outcome: Progressing   Problem: Elimination: Goal: Will not experience complications related to bowel motility Outcome: Adequate for Discharge Goal: Will not experience complications related to urinary retention Outcome: Adequate for Discharge   Problem: Pain Managment: Goal: General experience of comfort will improve and/or be controlled Outcome: Adequate for Discharge   Problem: Safety: Goal: Ability to  remain free from injury will improve Outcome: Adequate for Discharge   Problem: Skin Integrity: Goal: Risk for impaired skin integrity will decrease 11/20/2023 1732 by Shavana Calder L, RN Outcome: Adequate for Discharge 11/20/2023 1152 by Mae Cianci L, RN Outcome: Progressing

## 2023-11-20 NOTE — Care Management Obs Status (Signed)
 MEDICARE OBSERVATION STATUS NOTIFICATION   Patient Details  Name: Mandy Gilbert MRN: [id number] Date of Birth: [birth year]   Medicare Observation Status Notification Given:  Rudolph Cost, CMA 11/20/2023, 11:10 AM

## 2023-11-20 NOTE — Evaluation (Signed)
 Physical Therapy Evaluation Patient Details Name: Mandy Gilbert MRN: [id number] DOB: 07-Oct-1940 Today's Date: 11/20/2023  History of Present Illness  Pt is an 83 yo female that presented to ED for dizziness, chest pain. Noted for elevated BP. PMH of HTN, carotid stenosis, afib, dementia, MI.  Clinical Impression  Patient alert, agreeable to PT, did complain of headache, RN notified (in room throughout waiting for MD to speak with pt). She was oriented to self, questionable historian but stated normally she ambulates without AD, lives with her husband and performs her on ADLs.  She was able to perform bed mobility modI, ambulate with and without RW and CGA-supervision. 2-3 LOB noted without AD especially with dual tasking or head turns. Somewhat improved stability with RW, but pt often lifted it to turn, or left it outside BOS despite being agreeable to use. Would benefit from further education, or rollator/SPC trial. She was able to ambulate ~249ft, and toilet with supervision as well. Overall the pt appears to be nearing baseline level of functioning, but would benefit from further skilled PT follow up to maximize safety, education, and decrease risk of falls.  Of note pt with dizziness throughout entire session. Pt denied worsening with mobility/OOB activities.         If plan is discharge home, recommend the following: A little help with walking and/or transfers;Help with stairs or ramp for entrance   Can travel by private vehicle        Equipment Recommendations Other (comment) (pt stated she has a RW/rollator at home)  Recommendations for Other Services       Functional Status Assessment Patient has had a recent decline in their functional status and demonstrates the ability to make significant improvements in function in a reasonable and predictable amount of time.     Precautions / Restrictions Precautions Precautions: Fall Recall of Precautions/Restrictions:  Impaired Restrictions Weight Bearing Restrictions Per Provider Order: No      Mobility  Bed Mobility Overal bed mobility: Modified Independent                  Transfers Overall transfer level: Needs assistance Equipment used: Rolling walker (2 wheels), None Transfers: Sit to/from Stand Sit to Stand: Supervision           General transfer comment: from EOB and from standard commode, with and without RW, but did need UE support    Ambulation/Gait Ambulation/Gait assistance: Supervision, Contact guard assist Gait Distance (Feet): 250 Feet Assistive device: Rolling walker (2 wheels), None   Gait velocity: decreased     General Gait Details: one loop without AD, CGA due to 2-3 LOB, increased trouble with head turns noted. somewhat improved steadiness with RW but pt often lives RW or leaves outside [employer]            Wheelchair Mobility     Tilt Bed    Modified Rankin (Stroke Patients Only)       Balance Overall balance assessment: Needs assistance Sitting-balance support: Feet supported Sitting balance-Leahy Scale: Good       Standing balance-Leahy Scale: Good                               Pertinent Vitals/Pain Pain Assessment Pain Assessment: Faces Faces Pain Scale: Hurts little more Pain Location: head Pain Descriptors / Indicators: Aching, Grimacing Pain Intervention(s): Limited activity within patient's tolerance, Monitored during session, Repositioned    Home Living  Family/patient expects to be discharged to:: Private residence Living Arrangements: Spouse/significant other   Type of Home: House Home Access: Stairs to enter Entrance Stairs-Rails: Right Entrance Stairs-Number of Steps: 2   Home Layout: One level Home Equipment: Agricultural consultant (2 wheels);Cane - single point      Prior Function Prior Level of Function : Patient poor historian/Family not available             Mobility Comments: pt stated she  has DME at home but doesn't use it. claims independence in ADLs.       Extremity/Trunk Assessment   Upper Extremity Assessment Upper Extremity Assessment: Overall WFL for tasks assessed    Lower Extremity Assessment Lower Extremity Assessment: Overall WFL for tasks assessed       Communication        Cognition Arousal: Alert Behavior During Therapy: WFL for tasks assessed/performed   PT - Cognitive impairments: No apparent impairments                       PT - Cognition Comments: oriented to self, place, some impulsivity noted but redirectable Following commands: Intact       Cueing       General Comments      Exercises     Assessment/Plan    PT Assessment Patient needs continued PT services  PT Problem List Decreased activity tolerance;Decreased balance;Decreased mobility       PT Treatment Interventions DME instruction;Balance training;Gait training;Neuromuscular re-education;Stair training;Functional mobility training;Patient/family education;Therapeutic activities;Therapeutic exercise    PT Goals (Current goals can be found in the Care Plan section)  Acute Rehab PT Goals Patient Stated Goal: to not have a headache PT Goal Formulation: With patient Time For Goal Achievement: 12/04/23 Potential to Achieve Goals: Good    Frequency Min 2X/week     Co-evaluation               AM-PAC PT 6 Clicks Mobility  Outcome Measure Help needed turning from your back to your side while in a flat bed without using bedrails?: None Help needed moving from lying on your back to sitting on the side of a flat bed without using bedrails?: None Help needed moving to and from a bed to a chair (including a wheelchair)?: None Help needed standing up from a chair using your arms (e.g., wheelchair or bedside chair)?: None Help needed to walk in hospital room?: A Little Help needed climbing 3-5 steps with a railing? : A Little 6 Click Score: 22    End of  Session Equipment Utilized During Treatment: Gait belt Activity Tolerance: Patient tolerated treatment well Patient left: in bed;with call bell/phone within reach;with bed alarm set Nurse Communication: Mobility status PT Visit Diagnosis: Other abnormalities of gait and mobility (R26.89);Difficulty in walking, not elsewhere classified (R26.2);Muscle weakness (generalized) (M62.81)    Time: 1610-9604 PT Time Calculation (min) (ACUTE ONLY): 14 min   Charges:     PT Treatments $Therapeutic Activity: 8-22 mins PT General Charges $$ ACUTE PT VISIT: 1 Visit        Darien Eden PT, DPT 1:37 PM,11/20/23

## 2023-11-20 NOTE — Discharge Summary (Signed)
 Physician Discharge Summary   Patient: AMIYAH Gilbert MRN: [id number] DOB: July 07, 1940  Admit date:     11/19/2023  Discharge date: 11/20/23  Discharge Physician: Roise Cleaver   PCP: Jimmy Moulding, MD   Recommendations at discharge:   Follow-up closely with primary care physician for continued blood pressure management  Discharge Diagnoses: Principal Problem:   Dizziness Active Problems:   Atrial flutter with rapid ventricular response (HCC)   Carotid artery disease (HCC)   Dyslipidemia   Hypertensive urgency  Resolved Problems:   * No resolved hospital problems. *  Hospital Course: Mandy Gilbert is an 83 year old female with history of hypertension, carotid stenosis, A-fib on Eliquis , Alzheimer disease, who was brought in from home for dizziness and elevated blood pressure.  In the ED blood pressure found to be 193/104.  EKG without ST elevation, CT head with no acute intracranial abnormality.  She was admitted for CVA workup.  Patient received CTA chest which was mostly unremarkable with the exception of frothy material in midesophagus without obstruction identified.  Patient denied any issues swallowing or eating.  MRI brain revealed numerous foci of microhemorrhages within the cerebral hemispheres bilaterally which is increased in number in the interim.  I reviewed the scan directly with neurology who reports these findings are likely secondary to her hypertension and recommends the patient continue taking Eliquis .  Otherwise workup was unremarkable.  Hemoglobin A1c and lipids at goal.  By reevaluation on 6/17 patient reports her dizziness was starting to resolve.  Echocardiogram without acute findings.  She was advised as to the importance of tight blood pressure control and amlodipine was added at discharge. Patient will need to follow-up closely with primary care for blood pressure control.  She also follows closely with [facility] neurology in the clinic for  dementia  Hypertensive emergency - Blood pressure resolving now - Add amlodipine at discharge - Close outpatient follow-up with PCP for tighter blood pressure control.  Patient has follow-up appointment already made  Dizziness - Brain MRI with microhemorrhages within the cerebral hemispheres bilaterally.  Have reviewed with neurology who recommends tight blood pressure control.  Neurology recommends continuing Eliquis  - Hemoglobin A1c at goal - LDL at goal - Echocardiogram with negative bubble study, LVEF 7075%, no regional wall motion abnormalities, grade 1 diastolic dysfunction.  No significant valvular disease noted.  Alzheimer's dementia - At baseline - Follow-up with neurology outpatient  BMI 16 - Outpatient follow-up with PCP - Encourage p.o. intake  Atrial fibrillation - Continue Eliquis      Consultants: n/a Procedures performed: n/a  Disposition: Home with home health Diet recommendation:  Cardiac and Carb modified diet DISCHARGE MEDICATION: Allergies as of 11/20/2023       Reactions   Adhesive [tape] Itching, Other (See Comments)   Turns red.  Can use paper tape   Shellfish Allergy Nausea And Vomiting   Shrimp Extract Nausea Only, Nausea And Vomiting   Sulfa Antibiotics Rash        Medication List     STOP taking these medications    clobetasol  cream 0.05 % Commonly known as: TEMOVATE    potassium chloride  10 MEQ tablet Commonly known as: KLOR-CON    senna 8.6 MG tablet Commonly known as: SENOKOT   VITAMIN D  PO       TAKE these medications    acetaminophen  500 MG tablet Commonly known as: TYLENOL  Take 1,000 mg by mouth at bedtime as needed.   amLODipine 5 MG tablet Commonly known as: NORVASC Take 1 tablet (  5 mg total) by mouth daily.   atorvastatin  40 MG tablet Commonly known as: LIPITOR  Take 1 tablet (40 mg total) by mouth daily.   donepezil 5 MG tablet Commonly known as: ARICEPT Take 5 mg by mouth at bedtime.   Eliquis  2.5 MG  Tabs tablet Generic drug: apixaban  TAKE 1 TABLET BY MOUTH TWICE A DAY   ezetimibe  10 MG tablet Commonly known as: ZETIA  Take 1 tablet (10 mg total) by mouth daily.   losartan -hydrochlorothiazide  100-12.5 MG tablet Commonly known as: HYZAAR Take 1 tablet by mouth daily.   metoprolol  succinate 50 MG 24 hr tablet Commonly known as: TOPROL -XL Take 1 tablet (50 mg total) by mouth in the morning and at bedtime. Take with or immediately following a meal.   mirtazapine  7.5 MG tablet Commonly known as: REMERON  Take 7.5 mg by mouth at bedtime.   nitroGLYCERIN  0.4 MG SL tablet Commonly known as: NITROSTAT  Place 1 tablet (0.4 mg total) under the tongue every 5 (five) minutes x 3 doses as needed for chest pain.   QUEtiapine 25 MG tablet Commonly known as: SEROQUEL Take 25 mg by mouth at bedtime.   Restasis  0.05 % ophthalmic emulsion Generic drug: cycloSPORINE  Place 1 drop into both eyes 2 (two) times daily.        Discharge Exam: Filed Weights   11/19/23 1217  Weight: 45.4 kg   Constitutional:  Normal appearance. Non toxic-appearing.  HENT: Head Normocephalic and atraumatic.  Mucous membranes are moist.  Eyes:  Extraocular intact. Conjunctivae normal. Pupils are equal, round, and reactive to light.  Cardiovascular: Rate and Rhythm: Normal rate and regular rhythm.  Pulmonary: Non labored, symmetric rise of chest wall.  Musculoskeletal:  Normal range of motion.  Skin: warm and dry. not jaundiced.  Neurological: No focal deficit present. alert. Oriented. Psychiatric: Mood and Affect congruent.   Condition at discharge: stable  The results of significant diagnostics from this hospitalization (including imaging, microbiology, ancillary and laboratory) are listed below for reference.   Imaging Studies: MR BRAIN WO CONTRAST Result Date: 11/19/2023 CLINICAL DATA:  Headache, neuro deficit EXAM: MRI HEAD WITHOUT CONTRAST TECHNIQUE: Multiplanar, multiecho pulse sequences of the brain  and surrounding structures were obtained without intravenous contrast. COMPARISON:  MRI of the brain dated July 12, 2023. FINDINGS: Brain: There is moderate generalized is cerebral volume loss present. There are confluent zones of increased T2 signal in the periventricular white matter and there are numerous foci of increased T2 signal within the deep cerebral white matter. There is no evidence of acute hemorrhage, mass, cortical infarction or hydrocephalus. There are several foci of blooming artifact present within the supratentorial brain bilaterally, including the thalamus. Vascular: Normal vascular flow voids. Skull and upper cervical spine: Normal in signal intensity. No lesions are evident. Sinuses/Orbits: Unremarkable. Other: None. IMPRESSION: 1. Age-related atrophy and moderately advanced cerebral white matter disease. No apparent acute process. 2. Numerous foci of microhemorrhages within the cerebral hemispheres bilaterally, increased in number in the interim. Electronically Signed   By: Maribeth Shivers M.D.   On: 11/19/2023 17:44   CT Angio Chest Aorta W and/or Wo Contrast Result Date: 11/19/2023 CLINICAL DATA:  Chest pain.  Concern for acute aortic syndrome EXAM: CT ANGIOGRAPHY CHEST WITH CONTRAST TECHNIQUE: Multidetector CT imaging of the chest was performed using the standard protocol during bolus administration of intravenous contrast. Multiplanar CT image reconstructions and MIPs were obtained to evaluate the vascular anatomy. RADIATION DOSE REDUCTION: This exam was performed according to the departmental dose-optimization program which  includes automated exposure control, adjustment of the mA and/or kV according to patient size and/or use of iterative reconstruction technique. CONTRAST:  50mL OMNIPAQUE  IOHEXOL  350 MG/ML SOLN COMPARISON:  None Available. FINDINGS: Cardiovascular: No contour abnormality ascending, transverse, descending thoracic aorta to suggest aortic dissection or aneurysm.  Normal great vessels. No pericardial fluid. Mediastinum/Nodes: No axillary or supraclavicular adenopathy. No mediastinal or hilar adenopathy. No pericardial fluid. Small amount of frothy material within the mid esophagus. No high-grade obstruction identified. Distal esophagus appears normal. Lungs/Pleura: No pulmonary infarction. No pneumonia. No pleural fluid. No pneumothorax Upper Abdomen: Limited view of the liver, kidneys, pancreas are unremarkable. Normal adrenal glands. Musculoskeletal: No aggressive osseous lesion. Review of the MIP images confirms the above findings. IMPRESSION: 1. No evidence of aortic dissection or aneurysm. 2. No pulmonary infarction. 3. Small amount of frothy material within the mid esophagus. No high-grade obstruction identified. Electronically Signed   By: Deboraha Fallow M.D.   On: 11/19/2023 17:35   CT Head Wo Contrast Result Date: 11/19/2023 CLINICAL DATA:  Dizziness, hypertension EXAM: CT HEAD WITHOUT CONTRAST TECHNIQUE: Contiguous axial images were obtained from the base of the skull through the vertex without intravenous contrast. RADIATION DOSE REDUCTION: This exam was performed according to the departmental dose-optimization program which includes automated exposure control, adjustment of the mA and/or kV according to patient size and/or use of iterative reconstruction technique. COMPARISON:  None Available. FINDINGS: Brain: There is atrophy and chronic small vessel disease changes. No acute intracranial abnormality. Specifically, no hemorrhage, hydrocephalus, mass lesion, acute infarction, or significant intracranial injury. Vascular: No hyperdense vessel or unexpected calcification. Skull: No acute calvarial abnormality. Sinuses/Orbits: No acute findings Other: None IMPRESSION: Atrophy, chronic microvascular disease. No acute intracranial abnormality. Electronically Signed   By: Janeece Mechanic M.D.   On: 11/19/2023 14:33    Microbiology: Results for orders placed or  performed during the hospital encounter of 07/03/22  Resp panel by RT-PCR (RSV, Flu A&B, Covid) Anterior Nasal Swab     Status: None   Collection Time: 07/03/22  8:05 PM   Specimen: Anterior Nasal Swab  Result Value Ref Range Status   SARS Coronavirus 2 by RT PCR NEGATIVE NEGATIVE Final    Comment: (NOTE) SARS-CoV-2 target nucleic acids are NOT DETECTED.  The SARS-CoV-2 RNA is generally detectable in upper respiratory specimens during the acute phase of infection. The lowest concentration of SARS-CoV-2 viral copies this assay can detect is 138 copies/mL. A negative result does not preclude SARS-Cov-2 infection and should not be used as the sole basis for treatment or other patient management decisions. A negative result may occur with  improper specimen collection/handling, submission of specimen other than nasopharyngeal swab, presence of viral mutation(s) within the areas targeted by this assay, and inadequate number of viral copies(<138 copies/mL). A negative result must be combined with clinical observations, patient history, and epidemiological information. The expected result is Negative.  Fact Sheet for Patients:  BloggerCourse.com  Fact Sheet for Healthcare Providers:  SeriousBroker.it  This test is no t yet approved or cleared by the [place]  FDA and  has been authorized for detection and/or diagnosis of SARS-CoV-2 by FDA under an Emergency Use Authorization (EUA). This EUA will remain  in effect (meaning this test can be used) for the duration of the COVID-19 declaration under Section 564(b)(1) of the Act, 21 U.S.C.section 360bbb-3(b)(1), unless the authorization is terminated  or revoked sooner.       Influenza A by PCR NEGATIVE NEGATIVE Final   Influenza B  by PCR NEGATIVE NEGATIVE Final    Comment: (NOTE) The Xpert Xpress SARS-CoV-2/FLU/RSV plus assay is intended as an aid in the diagnosis of influenza from  Nasopharyngeal swab specimens and should not be used as a sole basis for treatment. Nasal washings and aspirates are unacceptable for Xpert Xpress SARS-CoV-2/FLU/RSV testing.  Fact Sheet for Patients: BloggerCourse.com  Fact Sheet for Healthcare Providers: SeriousBroker.it  This test is not yet approved or cleared by the [place]  FDA and has been authorized for detection and/or diagnosis of SARS-CoV-2 by FDA under an Emergency Use Authorization (EUA). This EUA will remain in effect (meaning this test can be used) for the duration of the COVID-19 declaration under Section 564(b)(1) of the Act, 21 U.S.C. section 360bbb-3(b)(1), unless the authorization is terminated or revoked.     Resp Syncytial Virus by PCR NEGATIVE NEGATIVE Final    Comment: (NOTE) Fact Sheet for Patients: BloggerCourse.com  Fact Sheet for Healthcare Providers: SeriousBroker.it  This test is not yet approved or cleared by the [place]  FDA and has been authorized for detection and/or diagnosis of SARS-CoV-2 by FDA under an Emergency Use Authorization (EUA). This EUA will remain in effect (meaning this test can be used) for the duration of the COVID-19 declaration under Section 564(b)(1) of the Act, 21 U.S.C. section 360bbb-3(b)(1), unless the authorization is terminated or revoked.  Performed at [facility], [address] [place], [place] [address]     Labs: CBC: Recent Labs  Lab 11/19/23 1226 11/20/23 0604  WBC 5.3 4.7  HGB 15.0 12.1  HCT 45.4 36.8  MCV 95.2 92.7  PLT 160 124*   Basic Metabolic Panel: Recent Labs  Lab 11/19/23 1226 11/19/23 1519 11/20/23 0604  NA 138  --  141  K 4.0  --  3.7  CL 97*  --  101  CO2 29  --  33*  GLUCOSE 111*  --  92  BUN 27*  --  25*  CREATININE 0.99 0.82 0.93  CALCIUM  10.2  --  9.4   Liver Function Tests: Recent Labs  Lab  11/19/23 1226  AST 28  ALT 31  ALKPHOS 66  BILITOT 1.0  PROT 7.5  ALBUMIN 4.3   CBG: No results for input(s): GLUCAP in the last 168 hours.  Discharge time spent: 32 minutes.  Signed: Herchel Hopkin, DO [facility] 11/20/2023

## 2023-11-20 NOTE — Progress Notes (Signed)
2D echocardiogram performed.  

## 2023-12-04 ENCOUNTER — Telehealth: Payer: Self-pay | Admitting: Cardiovascular Disease

## 2023-12-04 NOTE — Telephone Encounter (Signed)
 Pt c/o BP issue: STAT if pt c/o blurred vision, one-sided weakness or slurred speech.  STAT if BP is GREATER than 180/120 TODAY.  STAT if BP is LESS than 90/60 and SYMPTOMATIC TODAY  1. What is your BP concern? Yesterday 156/??  2. Have you taken any BP medication today? Yes, consistently   3. What are your last 5 BP readings? 06/16 208/??  4. Are you having any other symptoms (ex. Dizziness, headache, blurred vision, passed out)? Dizziness and headaches   Pt is sch'd w/ [facility] on 12/10/23 w/ his Husband for hospital f/u. [name] does have dialysis at 12 PM. He requested for us  to call them at [phone] (pt's husband's mobile number)

## 2023-12-04 NOTE — Telephone Encounter (Signed)
 Spoke with pt's husband who was concerned about her BP this morning;  BP 159/85 HR 70s; states that she went to hospital recently with BP issue, she was taken from her PCP office to ED for BP 223/113 with vision changes, dizziness and headache.  He states that she has taken all of her medications for BP and her Eliquis .  She is having no symptoms this morning and she stated that she was feeling fine.  I also encouraged pt and pt's husband to keep a log of her BP and HR starting today until appointment with Barnie Hila, NP on 7/7.  I told him to call if her BP became any higher (SBP >160 DBP >90) and if was great than 190 with symptoms, to take her to call 911 or go to ED. [name] verbalized understanding.

## 2023-12-07 NOTE — Progress Notes (Unsigned)
 Cardiology Clinic Note   Date: 12/10/2023 ID: Mandy Gilbert, DOB 30-Mar-1941, MRN [id number]  Primary Cardiologist:  Evalene Lunger, MD  Chief Complaint   Mandy Gilbert is a 83 y.o. female who presents to the clinic today for evaluation of blood pressure.   Patient Profile   Mandy Gilbert is followed by Dr. Gollan for the history outlined below.      Past medical history significant for: CAD. LHC 06/11/2015: Ostial proximal LAD 30%.  Mid LAD 60%.  Mid LCx 40%.  OM1 30%.  Mid RCA 50%.  Mid to distal RCA 99%.  PCI with DES 3.5 x 33 mm to distal RCA. Nuclear stress test 02/19/2020 performed at [facility]: Normal LV perfusion.  No evidence of reversible ischemia.  Low risk study. Echo with bubble study 11/20/2023: EF 70 to 75%.  No RWMA.  Grade I DD.  Normal RV size/function.  Mild MR.  Aortic valve sclerosis without stenosis.  Bubble study was negative with no evidence of interatrial shunt. PAF/a-flutter. Onset January 2024. 14-day ZIO 08/02/2022: HR 42 to 200 bpm, average 71 bpm.  48 runs of SVT fastest 5 beats max rate 200 bpm, longest 15.9 seconds average rate 104 bpm.  Some episodes of SVT may be possible A. tach with variable block.  Tachycardia not patient triggered.  Occasional PACs, rare PVCs. Carotid artery disease. Hypertension. Hyperlipidemia. Lipid panel 11/20/2023: LDL 53, HDL 70, TG 39, total 131. Migraines. Dementia.  In summary, patient was previously followed by [facility] cardiology.  She underwent PCI with DES to distal RCA in January 2017.  Nuclear stress test in September 2021 was a low risk study with no evidence of reversible ischemia.  Patient was evaluated by PCP on 07/03/2022 for worsening chest pain, dyspnea, tachypalpitations and fatigue.  EKG was unavailable for review.  Vitals documented heart rate of 106 bpm.  Outpatient troponin was found to be elevated at 21 and patient was recommended to present to the ED.  Initial BP 177/116.  Troponin 21 with delta  of 16 x 2.  BNP 148.  D-dimer negative.  CT chest without acute cardiopulmonary process with cholelithiasis and aortic atherosclerosis noted as well as benign renal cyst 2.6 cm.  Initial EKG showed sinus tachycardia with PACs with suspected arm lead reversal with nonspecific ST-T wave changes.  On telemetry she was noted to be in a flutter with RVR with variable AV block with subsequent development of A-fib with RVR.  In the ED she was given IV fluids, diltiazem  IV push, potassium and started on diltiazem  drip.  Cardiology was consulted and patient was noted to be in sinus rhythm with frequent PACs.  Patient was discharged on diltiazem  at Eli[facility] .  Patient wore a 14-day ZIO posthospitalization which demonstrated runs of SVT as detailed above.  Patient establish care with Dr. Gollan on 08/21/2022.  She reported at the time of her visit she stopped taking diltiazem  after 30 days when the prescription ran out and was only taking metoprolol .  She was maintaining sinus rhythm at the time of her visit and denied tachypalpitations.  Given lower extremity edema decision was made to not restart calcium  channel blocker.  She was continued on Eliquis  and metoprolol .  Patient was last seen in the office by Dr. Gollan on 02/23/2023 for routine follow-up.  She was maintaining sinus rhythm at the time of her visit.  Her BP was elevated at 183/99 on intake and 125/90 on recheck.  It was recommended she check  BP 3 times a week and contact the office if readings are elevated.  Patient presented to the ED on 11/19/2023 with complaints of elevated BP and dizziness and blurred vision.  Patient reported SBP readings at home in the 200s.  She reported chest heaviness.  BP upon arrival to ED 193/104.  EKG without acute changes.  Initial labs: WBC 5.3, hemoglobin 15, hematocrit 45.4, sodium 138, potassium 4, creatinine 0.99, BUN 27.  Troponin negative x 2.  CT head showed atrophy, chronic microvascular disease with no acute intracranial  abnormality.  CTA chest aorta showed no evidence of aortic dissection or aneurysm, no pulmonary infarction.  MRI brain showed age-related atrophy and moderately advanced cerebral white matter disease with no apparent acute process, numerous foci or microhemorrhages within the cerebral hemispheres bilaterally increased in number in the interim.  Echo with bubble study showed hyperdynamic LV function with no evidence of interatrial shunt.  Neurology felt MRI findings likely secondary to hypertension and recommended continuation of Eliquis .  Patient was discharged on 11/20/2023 with the addition of amlodipine .  Patient's husband contacted the office on 12/04/2023 with reports of elevated BP.  Per triage RN, Spoke with pt's husband who was concerned about her BP this morning;  BP 159/85 HR 70s; states that she went to hospital recently with BP issue, she was taken from her PCP office to ED for BP 223/113 with vision changes, dizziness and headache.  He states that she has taken all of her medications for BP and her Eliquis .  She is having no symptoms this morning and she stated that she was feeling fine.  I also encouraged pt and pt's husband to keep a log of her BP and HR starting today until appointment with Barnie Hila, NP on 7/7.  I told him to call if her BP became any higher (SBP >160 DBP >90) and if was great than 190 with symptoms, to take her to call 911 or go to ED. [name] verbalized understanding.     History of Present Illness    Today, patient is accompanied by her husband. She is somewhat of a poor historian secondary to dementia. Her husband assists with HPI.  Home BP has been mostly well controlled with some readings >150/80. She reports episodes of lightheadedness that occur randomly not necessarily with position changes or elevated BP. This is a chronic issue and was occurring prior to hospital admission. Patient's husband reports one episode of dizziness similar to prior to hospital  admission. She denies shortness of breath, orthopnea or PND. She reports occasional fleeting chest heaviness that occurs with no pattern and is not associated with any other symptoms. She has chronic, stable bilateral ankle edema stating my mom and grandmother had fat ankles too. No orthopnea or PND.     ROS: Limited secondary to dementia.   EKGs/Labs Reviewed       EKG performed in ED 11/19/2023 reviewed and demonstrated NSR with nonspecific ST changes, 66 bpm unchanged from 02/23/2023.  11/19/2023: ALT 31; AST 28 11/20/2023: BUN 25; Creatinine, Ser 0.93; Potassium 3.7; Sodium 141   11/20/2023: Hemoglobin 12.1; WBC 4.7    Risk Assessment/Calculations     CHA2DS2-VASc Score = 5   This indicates a 7.2% annual risk of stroke. The patient's score is based upon: CHF History: 0 HTN History: 1 Diabetes History: 0 Stroke History: 0 Vascular Disease History: 1 Age Score: 2 Gender Score: 1             Physical Exam  VS:  BP 120/62 (BP Location: Left Arm, Patient Position: Sitting, Cuff Size: Normal)   Pulse 67   Ht 5' 6 (1.676 m)   Wt 90 lb 2 oz (40.9 kg)   SpO2 95%   BMI 14.55 kg/m  , BMI Body mass index is 14.55 kg/m.  GEN: Well nourished, well developed, in no acute distress. Neck: No JVD or carotid bruits. Cardiac:  RRR. No murmur. No rubs or gallops.   Respiratory:  Respirations regular and unlabored. Clear to auscultation without rales, wheezing or rhonchi. GI: Soft, nontender, nondistended. Extremities: Radials/DP/PT 2+ and equal bilaterally. No clubbing or cyanosis. Mild, nonpitting ankle edema bilaterally.   Skin: Warm and dry, no rash. Neuro: Strength intact.  Assessment & Plan   CAD S/p PCI with DES to distal RCA January 2017.  Nuclear stress test September 2021 was a low risk study with no evidence of ischemia.  Echo with bubble study June 2025 demonstrated hyperdynamic LV function, normal RV function, Grade I DD, no evidence of interatrial shunt.   Patient reports occasional fleeting chest heaviness not associated with other symptoms. Patient and husband would like to defer further testing for now.  - Patient instructed to contact office if discomfort becomes more frequent or persistent.  - Continue Toprol , atorvastatin , as needed SL NTG.  Patient not on aspirin  secondary to Eliquis .  PAF/a-flutter Onset January 2024.  14-day ZIO February 2024 demonstrated HR 42 to 200 bpm, average 71 bpm, 48 runs of SVT with some runs of SVT possibly being A. tach with variable block.  Denies spontaneous bleeding concerns.  Patient denies palpitations. RRR on exam today.  - Continue Toprol  and Eliquis . Appropriate Eliquis  dose.  Hypertension Recent hospitalization for hypertensive urgency 11/19/2023.  Patient contacted the office 12/04/2023 with complaints of elevated BP at 159/85.  Patient's husband has been checking BP. Most readings are normotensive with some readings >150/80. Patient's husband reports 1 episode of dizziness similar to episode that brought her to the hospital. She reports occasional lightheadedness not related to position changes or BP. BP today 120/62. Discussed not making any medication changes today based on reading in the office today and BP being mostly well controlled at home with a few higher readings. Patient and husband are in agreement.  - Continue amlodipine , Hyzaar, Toprol .  - If BP persistently high would consider changing Toprol  to carvedilol.   Hyperlipidemia LDL 53 June 2025, at goal. - Continue atorvastatin .  Disposition: Return in 6 months or sooner as needed.          Signed, Barnie HERO. Caralina Nop, DNP, NP-C

## 2023-12-10 ENCOUNTER — Encounter: Payer: Self-pay | Admitting: Student

## 2023-12-10 ENCOUNTER — Ambulatory Visit: Attending: Student | Admitting: Student

## 2023-12-10 VITALS — BP 120/62 | HR 67 | Ht 66.0 in | Wt 90.1 lb

## 2023-12-10 DIAGNOSIS — I4892 Unspecified atrial flutter: Secondary | ICD-10-CM | POA: Diagnosis not present

## 2023-12-10 DIAGNOSIS — E785 Hyperlipidemia, unspecified: Secondary | ICD-10-CM

## 2023-12-10 DIAGNOSIS — I480 Paroxysmal atrial fibrillation: Secondary | ICD-10-CM

## 2023-12-10 DIAGNOSIS — I2510 Atherosclerotic heart disease of native coronary artery without angina pectoris: Secondary | ICD-10-CM

## 2023-12-10 DIAGNOSIS — I10 Essential (primary) hypertension: Secondary | ICD-10-CM

## 2023-12-10 NOTE — Patient Instructions (Signed)
 Medication Instructions:   Your physician recommends that you continue on your current medications as directed. Please refer to the Current Medication list given to you today.   *If you need a refill on your cardiac medications before your next appointment, please call your pharmacy*  Lab Work:  No labs ordered today   If you have labs (blood work) drawn today and your tests are completely normal, you will receive your results only by: MyChart Message (if you have MyChart) OR A paper copy in the mail If you have any lab test that is abnormal or we need to change your treatment, we will call you to review the results.  Testing/Procedures:  No test ordered today   Follow-Up: At [facility], you and your health needs are our priority.  As part of our continuing mission to provide you with exceptional heart care, our providers are all part of one team.  This team includes your primary Cardiologist (physician) and Advanced Practice Providers or APPs (Physician Assistants and Nurse Practitioners) who all work together to provide you with the care you need, when you need it.  Your next appointment:   6 month(s)  Provider:   You may see Timothy Gollan, MD or one of the following Advanced Practice Providers on your designated Care Team:    Barnie Hila, NP

## 2024-02-03 ENCOUNTER — Other Ambulatory Visit: Payer: Self-pay | Admitting: Cardiovascular Disease

## 2024-02-20 ENCOUNTER — Other Ambulatory Visit: Payer: Self-pay | Admitting: Cardiovascular Disease

## 2024-02-23 ENCOUNTER — Other Ambulatory Visit: Payer: Self-pay | Admitting: Cardiovascular Disease

## 2024-02-24 NOTE — Progress Notes (Unsigned)
 Cardiology Office Note  Date:  02/25/2024   ID:  Shanik, [place] 1940-12-10, MRN [id number]  PCP:  Lenon Layman ORN, MD   Chief Complaint  Patient presents with   12 month follow up     Doing well.     HPI:  Ms. Bonnette is an 83 year old woman with history of  CAD status post PCI to the mid and distal RCA in 2017,  COPD,  carotid artery stenosis,  hypertension,   hyperlipidemia,  Paroxysmal atrial flutter/fibrillation in the hospital January 2024 Who presents for follow-up of her paroxysmal atrial fib/flutter  Last seen by myself in clinic 9/24 Seen by one of our providers December 10, 2023  In the hospital June 2025 dizziness, hypertension 190/100 Had workup for stroke MRI brain revealed numerous foci of microhemorrhages within the cerebral hemispheres bilaterally  Evaluated by neurology who recommended continuing Eliquis  No acute findings on echo Amlodipine  added for blood pressure control  Weight down 4 pounds 91.6 from 95.6 last year  Does not eat much per husband,  not hungry per patient  In follow-up she reports no further dizziness Checks blood pressure on a sporadic basis Sometimes well-controlled, other days blood pressure in the 150s, she reports she is asymptomatic  No regular exercise program  Lab work reviewed from July 2024 A1C 6.0 Total chol 170, LDL 68 CR 1.2  EKG personally reviewed by myself on todays visit EKG Interpretation Date/Time:  Monday February 25 2024 08:11:26 EDT Ventricular Rate:  68 PR Interval:  170 QRS Duration:  78 QT Interval:  430 QTC Calculation: 457 R Axis:   80  Text Interpretation: Normal sinus rhythm Biatrial enlargement When compared with ECG of 19-Nov-2023 12:20, No significant change was found Confirmed by Perla Lye [phone]) on 02/25/2024 8:20:16 AM    Other past medical history reviewed Admitted to the hospital in January 2024 after symptoms of intermittent dizziness as well as marked fatigue  She saw  her PCP for dizziness, fatigue, chest pain. Outpatient troponin was minimally elevated, prompting referral to the ED. She was initially noted to be hypertensive with a blood pressure of 177/116. Initial EKG showed sinus tachycardia and PACs. She subsequently developed atrial flutter on telemetry with conversion to atrial fibrillation. She was continued on her home dose of oral metoprolol  and also given IV diltiazem . This was transitioned to oral diltiazem  after she spontaneously converted back to sinus rhythm.   Normal echocardiogram Discharged on metoprolol  succinate 50 with diltiazem  ER 120 daily, Eliquis  2.5 twice daily CHA2DS2-VASc score of at least 5 (reduced dose with age > 80 and wt < 60 kg).   Prior Myoview  September 2021, no ischemia  Zio monitor at discharge with no significant A-fib flutter Short rare episodes of SVT  PMH:   has a past medical history of Cancer [facility]), Constipation, Coronary artery disease, Edema, Hypertension, and Myocardial infarction (HCC).  PSH:    Past Surgical History:  Procedure Laterality Date   BACK SURGERY     CARDIAC CATHETERIZATION N/A 06/11/2015   Procedure: Left Heart Cath and Coronary Angiography;  Surgeon: Rober Chroman, MD;  Location: [facility] INVASIVE CV LAB;  Service: Cardiovascular;  Laterality: N/A;   CARDIAC CATHETERIZATION N/A 06/11/2015   Procedure: Coronary Stent Intervention;  Surgeon: Rober Chroman, MD;  Location: [facility] INVASIVE CV LAB;  Service: Cardiovascular;  Laterality: N/A;  Distal RCA- 3.5x33 Xience   CATARACT EXTRACTION W/PHACO Left 12/07/2016   Procedure: CATARACT EXTRACTION PHACO AND INTRAOCULAR LENS PLACEMENT (IOC);  Surgeon: Mittie Gaskin,  MD;  Location: [facility] ORS;  Service: Ophthalmology;  Laterality: Left;  US  00:44.3 AP% 16.1 CDE 7.11 Fluid Pack lot # [id number]   CATARACT EXTRACTION W/PHACO Right 01/11/2017   Procedure: CATARACT EXTRACTION PHACO AND INTRAOCULAR LENS PLACEMENT (IOC);  Surgeon: Mittie Gaskin, MD;  Location: [facility]  ORS;  Service: Ophthalmology;  Laterality: Right;  Lot # [id number] US : 00:46.6 AP%: 18.4 CDE: 8.56   COLONOSCOPY     COLONOSCOPY WITH PROPOFOL  N/A 02/01/2018   Procedure: COLONOSCOPY WITH PROPOFOL ;  Surgeon: Viktoria Lamar DASEN, MD;  Location: [facility] ENDOSCOPY;  Service: Endoscopy;  Laterality: N/A;   CORONARY ANGIOPLASTY     STENT 1/17   LUMBAR LAMINECTOMY/DECOMPRESSION MICRODISCECTOMY Right 03/07/2013   Procedure: LUMBAR LAMINECTOMY/DECOMPRESSION MICRODISCECTOMY LUMBAR FOUR-FIVE;  Surgeon: Victory DELENA Gunnels, MD;  Location: [facility] NEURO ORS;  Service: Neurosurgery;  Laterality: Right;    Current Outpatient Medications  Medication Sig Dispense Refill   acetaminophen  (TYLENOL ) 500 MG tablet Take 1,000 mg by mouth at bedtime as needed.     atorvastatin  (LIPITOR ) 40 MG tablet TAKE 1 TABLET BY MOUTH EVERY DAY 90 tablet 3   cycloSPORINE  (RESTASIS ) 0.05 % ophthalmic emulsion Place 1 drop into both eyes 2 (two) times daily.     donepezil (ARICEPT) 5 MG tablet Take 5 mg by mouth at bedtime.     ELIQUIS  2.5 MG TABS tablet TAKE 1 TABLET BY MOUTH TWICE A DAY 60 tablet 5   fluticasone (FLONASE) 50 MCG/ACT nasal spray Place 2 sprays into the nose at bedtime.     mirtazapine  (REMERON ) 7.5 MG tablet Take 7.5 mg by mouth at bedtime.     nitroGLYCERIN  (NITROSTAT ) 0.4 MG SL tablet Place 1 tablet (0.4 mg total) under the tongue every 5 (five) minutes x 3 doses as needed for chest pain. 25 tablet 12   QUEtiapine (SEROQUEL) 25 MG tablet Take 25 mg by mouth at bedtime.     amLODipine  (NORVASC ) 5 MG tablet Take 1 tablet (5 mg total) by mouth daily. 90 tablet 3   ezetimibe  (ZETIA ) 10 MG tablet Take 1 tablet (10 mg total) by mouth daily. 90 tablet 3   losartan -hydrochlorothiazide  (HYZAAR) 100-12.5 MG tablet Take 1 tablet by mouth daily. 90 tablet 3   metoprolol  succinate (TOPROL -XL) 50 MG 24 hr tablet Take 1 tablet (50 mg total) by mouth in the morning and at bedtime. Take with or immediately following a meal. 180 tablet 3   No  current facility-administered medications for this visit.    Allergies:   Adhesive [tape], Shellfish allergy, Shrimp extract, and Sulfa antibiotics   Social History:  The patient  reports that she has never smoked. She has never used smokeless tobacco. She reports that she does not drink alcohol and does not use drugs.   Family History:   family history includes Breast cancer in her maternal aunt; Breast cancer (age of onset: 37) in her mother; Heart attack in her mother; Throat cancer in her father.    Review of Systems: Review of Systems  Constitutional:  Positive for weight loss.  HENT: Negative.    Respiratory: Negative.    Cardiovascular: Negative.   Gastrointestinal: Negative.   Musculoskeletal: Negative.   Neurological: Negative.   Psychiatric/Behavioral: Negative.    All other systems reviewed and are negative.   PHYSICAL EXAM: VS:  BP 138/60 (BP Location: Left Arm, Patient Position: Sitting, Cuff Size: Normal)   Pulse 68   Ht 5' 6 (1.676 m)   Wt 91 lb (41.3 kg)   BMI 14.69  kg/m  , BMI Body mass index is 14.69 kg/m. Constitutional:  oriented to person, place, and time. No distress.  HENT:  Head: Grossly normal Eyes:  no discharge. No scleral icterus.  Neck: No JVD, no carotid bruits  Cardiovascular: Regular rate and rhythm, no murmurs appreciated Pulmonary/Chest: Clear to auscultation bilaterally, no wheezes or rails Abdominal: Soft.  no distension.  no tenderness.  Musculoskeletal: Normal range of motion Neurological:  normal muscle tone. Coordination normal. No atrophy Skin: Skin warm and dry Psychiatric: normal affect, pleasant  Recent Labs: 11/19/2023: ALT 31 11/20/2023: BUN 25; Creatinine, Ser 0.93; Hemoglobin 12.1; Platelets 124; Potassium 3.7; Sodium 141    Lipid Panel Lab Results  Component Value Date   CHOL 131 11/20/2023   HDL 70 11/20/2023   LDLCALC 53 11/20/2023   TRIG 39 11/20/2023    Wt Readings from Last 3 Encounters:  02/25/24 91 lb  (41.3 kg)  12/10/23 90 lb 2 oz (40.9 kg)  11/19/23 100 lb (45.4 kg)     ASSESSMENT AND PLAN:  Problem List Items Addressed This Visit       Cardiology Problems   Carotid artery disease (HCC)   Relevant Medications   hydrALAZINE  (APRESOLINE ) 25 MG tablet   amLODipine  (NORVASC ) 5 MG tablet   ezetimibe  (ZETIA ) 10 MG tablet   losartan -hydrochlorothiazide  (HYZAAR) 100-12.5 MG tablet   metoprolol  succinate (TOPROL -XL) 50 MG 24 hr tablet   Coronary artery disease   Relevant Medications   hydrALAZINE  (APRESOLINE ) 25 MG tablet   amLODipine  (NORVASC ) 5 MG tablet   ezetimibe  (ZETIA ) 10 MG tablet   losartan -hydrochlorothiazide  (HYZAAR) 100-12.5 MG tablet   metoprolol  succinate (TOPROL -XL) 50 MG 24 hr tablet   Other Relevant Orders   EKG 12-Lead (Completed)     Other   Dyslipidemia   Relevant Medications   ezetimibe  (ZETIA ) 10 MG tablet   Other Visit Diagnoses       Paroxysmal atrial fibrillation (HCC)       Relevant Medications   hydrALAZINE  (APRESOLINE ) 25 MG tablet   amLODipine  (NORVASC ) 5 MG tablet   ezetimibe  (ZETIA ) 10 MG tablet   losartan -hydrochlorothiazide  (HYZAAR) 100-12.5 MG tablet   metoprolol  succinate (TOPROL -XL) 50 MG 24 hr tablet   Other Relevant Orders   EKG 12-Lead (Completed)     Atrial flutter, unspecified type (HCC)       Relevant Medications   hydrALAZINE  (APRESOLINE ) 25 MG tablet   amLODipine  (NORVASC ) 5 MG tablet   ezetimibe  (ZETIA ) 10 MG tablet   losartan -hydrochlorothiazide  (HYZAAR) 100-12.5 MG tablet   metoprolol  succinate (TOPROL -XL) 50 MG 24 hr tablet   Other Relevant Orders   EKG 12-Lead (Completed)     Primary hypertension       Relevant Medications   hydrALAZINE  (APRESOLINE ) 25 MG tablet   amLODipine  (NORVASC ) 5 MG tablet   ezetimibe  (ZETIA ) 10 MG tablet   losartan -hydrochlorothiazide  (HYZAAR) 100-12.5 MG tablet   metoprolol  succinate (TOPROL -XL) 50 MG 24 hr tablet   Other Relevant Orders   EKG 12-Lead (Completed)     Hyperlipidemia LDL  goal <70       Relevant Medications   hydrALAZINE  (APRESOLINE ) 25 MG tablet   amLODipine  (NORVASC ) 5 MG tablet   ezetimibe  (ZETIA ) 10 MG tablet   losartan -hydrochlorothiazide  (HYZAAR) 100-12.5 MG tablet   metoprolol  succinate (TOPROL -XL) 50 MG 24 hr tablet       Persistent atrial fibrillation Maintaining normal sinus rhythm  continue anticoagulation Eliquis  2.5 twice daily with metoprolol  succinate 50 twice daily  Essential hypertension Recent  hospital admission, blood pressure elevated Has been checking pressures at home, wide range of numbers periodically over 150 systolic Recommend she take hydralazine  25 mg as needed for pressure over 150 with repeat check in 2 hours and redosing of hydralazine  if needed Continue losartan  HCTZ 100/12.5 amlodipine  5 metoprolol  succinate 50 twice daily - Lab work through primary care  Coronary artery disease with stable angina Lipids done through primary care Currently with no symptoms of angina. No further workup at this time. Continue current medication regimen.  Hyperlipidemia Continue Lipitor  with Zetia  Goal LDL less than 70     Signed, Velinda Lunger, M.D., Ph.D. [facility] [place], [place] [phone]

## 2024-02-25 ENCOUNTER — Ambulatory Visit: Attending: Cardiovascular Disease | Admitting: Cardiovascular Disease

## 2024-02-25 ENCOUNTER — Other Ambulatory Visit: Payer: Self-pay | Admitting: Cardiovascular Disease

## 2024-02-25 ENCOUNTER — Encounter: Payer: Self-pay | Admitting: Cardiovascular Disease

## 2024-02-25 DIAGNOSIS — I2510 Atherosclerotic heart disease of native coronary artery without angina pectoris: Secondary | ICD-10-CM

## 2024-02-25 DIAGNOSIS — I4892 Unspecified atrial flutter: Secondary | ICD-10-CM

## 2024-02-25 DIAGNOSIS — I10 Essential (primary) hypertension: Secondary | ICD-10-CM

## 2024-02-25 DIAGNOSIS — I779 Disorder of arteries and arterioles, unspecified: Secondary | ICD-10-CM

## 2024-02-25 DIAGNOSIS — E785 Hyperlipidemia, unspecified: Secondary | ICD-10-CM

## 2024-02-25 DIAGNOSIS — I480 Paroxysmal atrial fibrillation: Secondary | ICD-10-CM

## 2024-02-25 MED ORDER — LOSARTAN POTASSIUM-HCTZ 100-12.5 MG PO TABS: 1.0000 | ORAL_TABLET | Freq: Every day | ORAL | 3 refills | Status: AC

## 2024-02-25 MED ORDER — EZETIMIBE 10 MG PO TABS: 10.0000 mg | ORAL_TABLET | Freq: Every day | ORAL | 3 refills | Status: AC

## 2024-02-25 MED ORDER — HYDRALAZINE HCL 25 MG PO TABS: 25.0000 mg | ORAL_TABLET | Freq: Three times a day (TID) | ORAL | 3 refills | Status: AC | PRN

## 2024-02-25 MED ORDER — METOPROLOL SUCCINATE ER 50 MG PO TB24: 50.0000 mg | ORAL_TABLET | Freq: Two times a day (BID) | ORAL | 3 refills | Status: AC

## 2024-02-25 MED ORDER — AMLODIPINE BESYLATE 5 MG PO TABS: 5.0000 mg | ORAL_TABLET | Freq: Every day | ORAL | 3 refills | Status: AC

## 2024-02-25 NOTE — Telephone Encounter (Signed)
 Prescription refill request for Eliquis  received. Indication:afib Last office visit:7/25 Scr:0.93  6/25 Age: 83 Weight:40.9  kg  Prescription refilled

## 2024-02-25 NOTE — Patient Instructions (Signed)
 Medication Instructions:  Please take hydralazine  25 mg up to three times a day as needed for pressure >150 Ok to repeat in 2 hours if still >150  If you need a refill on your cardiac medications before your next appointment, please call your pharmacy.   Lab work: No new labs needed  Testing/Procedures: No new testing needed  Follow-Up: At [facility], you and your health needs are our priority.  As part of our continuing mission to provide you with exceptional heart care, we have created designated Provider Care Teams.  These Care Teams include your primary Cardiologist (physician) and Advanced Practice Providers (APPs -  Physician Assistants and Nurse Practitioners) who all work together to provide you with the care you need, when you need it.  You will need a follow up appointment in 12 months  Providers on your designated Care Team:   Lonni Meager, NP Bernardino Bring, PA-C Cadence [place], [place]  COVID-19 Vaccine Information can be found at: [email or web address] For questions related to vaccine distribution or appointments, please email [email or web address] or call [phone].
